# Patient Record
Sex: Male | Born: 1980
Health system: Southern US, Community
[De-identification: ages and names within clinical notes are randomized; demographics above are authoritative.]

## PROBLEM LIST (undated history)

## (undated) DIAGNOSIS — I10 Essential (primary) hypertension: Secondary | ICD-10-CM

## (undated) DIAGNOSIS — B019 Varicella without complication: Secondary | ICD-10-CM

## (undated) DIAGNOSIS — F411 Generalized anxiety disorder: Secondary | ICD-10-CM

## (undated) DIAGNOSIS — F909 Attention-deficit hyperactivity disorder, unspecified type: Secondary | ICD-10-CM

## (undated) DIAGNOSIS — A6000 Herpesviral infection of urogenital system, unspecified: Secondary | ICD-10-CM

## (undated) DIAGNOSIS — F17200 Nicotine dependence, unspecified, uncomplicated: Secondary | ICD-10-CM

## (undated) HISTORY — DX: Attention-deficit hyperactivity disorder, unspecified type: F90.9

## (undated) HISTORY — DX: Varicella without complication: B01.9

## (undated) HISTORY — DX: Nicotine dependence, unspecified, uncomplicated: F17.200

## (undated) HISTORY — DX: Essential (primary) hypertension: I10

## (undated) HISTORY — PX: MOUTH SURGERY: SHX715

## (undated) HISTORY — DX: Generalized anxiety disorder: F41.1

## (undated) HISTORY — DX: Herpesviral infection of urogenital system, unspecified: A60.00

---

## 2009-01-17 ENCOUNTER — Emergency Department: Payer: Self-pay | Admitting: Emergency Medicine

## 2014-11-11 ENCOUNTER — Ambulatory Visit: Payer: Self-pay | Admitting: Internal Medicine

## 2014-11-18 ENCOUNTER — Ambulatory Visit (INDEPENDENT_AMBULATORY_CARE_PROVIDER_SITE_OTHER): Payer: BLUE CROSS/BLUE SHIELD | Admitting: Internal Medicine

## 2014-11-18 ENCOUNTER — Encounter (INDEPENDENT_AMBULATORY_CARE_PROVIDER_SITE_OTHER): Payer: Self-pay

## 2014-11-18 ENCOUNTER — Encounter: Payer: Self-pay | Admitting: Internal Medicine

## 2014-11-18 VITALS — BP 128/74 | HR 72 | Temp 98.4°F | Ht 69.0 in | Wt 188.0 lb

## 2014-11-18 DIAGNOSIS — F17200 Nicotine dependence, unspecified, uncomplicated: Secondary | ICD-10-CM

## 2014-11-18 DIAGNOSIS — Z720 Tobacco use: Secondary | ICD-10-CM | POA: Diagnosis not present

## 2014-11-18 DIAGNOSIS — A6000 Herpesviral infection of urogenital system, unspecified: Secondary | ICD-10-CM | POA: Diagnosis not present

## 2014-11-18 HISTORY — DX: Nicotine dependence, unspecified, uncomplicated: F17.200

## 2014-11-18 MED ORDER — VARENICLINE TARTRATE 1 MG PO TABS: 1.0000 mg | ORAL_TABLET | Freq: Two times a day (BID) | ORAL | Status: DC

## 2014-11-18 MED ORDER — VARENICLINE TARTRATE 0.5 MG X 11 & 1 MG X 42 PO MISC: ORAL | Status: DC

## 2014-11-18 MED ORDER — VALACYCLOVIR HCL 500 MG PO TABS: ORAL_TABLET | ORAL | Status: DC

## 2014-11-18 NOTE — Progress Notes (Signed)
HPI  Pt presents to the clinic today to establish care and for management of the conditions listed below. He is transferring care from [facility].  Genital Herpes: His last outbreak was 1 year ago, and he reports this is the only time that it happened.  He does want to talk about smoking cessation. He has tried nicotine patches, gums lozenges and vapor. Nothing has worked. His wife is expecting and he really wants to quit.  Flu: never Tetanus: 2011 Dentist: biannually  Past Medical History  Diagnosis Date  . Chicken pox   . Genital herpes     Current Outpatient Prescriptions  Medication Sig Dispense Refill  . GINSENG KOREAN PO Take 1 capsule by mouth daily.    . naproxen sodium (ANAPROX) 220 MG tablet Take 220 mg by mouth as needed.     No current facility-administered medications for this visit.    Allergies  Allergen Reactions  . Bee Venom Swelling    Family History  Problem Relation Age of Onset  . Heart disease Maternal Grandmother     Social History   Social History  . Marital Status: Single    Spouse Name: N/A  . Number of Children: N/A  . Years of Education: N/A   Occupational History  . Not on file.   Social History Main Topics  . Smoking status: Current Some Day Smoker -- 1.50 packs/day    Types: Cigarettes  . Smokeless tobacco: Never Used  . Alcohol Use: 0.0 oz/week    0 Standard drinks or equivalent per week     Comment: occasional  . Drug Use: No  . Sexual Activity: Not on file   Other Topics Concern  . Not on file   Social History Narrative  . No narrative on file    ROS:  Constitutional: Denies fever, malaise, fatigue, headache or abrupt weight changes.  Respiratory: Denies difficulty breathing, shortness of breath, cough or sputum production.   Cardiovascular: Denies chest pain, chest tightness, palpitations or swelling in the hands or feet.  Gastrointestinal: Denies abdominal pain, bloating, constipation, diarrhea  or blood in the stool.  GU: Denies frequency, urgency, pain with urination, blood in urine, odor or discharge. Neurological: Denies dizziness, difficulty with memory, difficulty with speech or problems with balance and coordination.  Psych: Denies anxiety, depression, SI/HI.  No other specific complaints in a complete review of systems (except as listed in HPI above).  PE:  BP 128/74 mmHg  Pulse 72  Temp(Src) 98.4 F (36.9 C) (Oral)  Ht  (1.753 m)  Wt 188 lb (85.276 kg)  BMI 27.75 kg/m2  SpO2 98%  Wt Readings from Last 3 Encounters:  11/18/14 188 lb (85.276 kg)    General: Appears his stated age, well developed, well nourished in NAD. Cardiovascular: Normal rate and rhythm. S1,S2 noted.  No murmur, rubs or gallops noted.  Pulmonary/Chest: Normal effort and positive vesicular breath sounds. No respiratory distress. No wheezes, rales or ronchi noted.  Abdomen: Soft and nontender. Normal bowel sounds, no bruits noted. No distention or masses noted. Liver, spleen and kidneys non palpable. Neurological: Alert and oriented.  Psychiatric: Mood and affect normal. Behavior is normal. Judgment and thought content normal.    Assessment and Plan:

## 2014-11-18 NOTE — Patient Instructions (Signed)
Smoking Cessation Quitting smoking is important to your health and has many advantages. However, it is not always easy to quit since nicotine is a very addictive drug. Oftentimes, people try 3 times or more before being able to quit. This document explains the best ways for you to prepare to quit smoking. Quitting takes hard work and a lot of effort, but you can do it. ADVANTAGES OF QUITTING SMOKING  You will live longer, feel better, and live better.  Your body will feel the impact of quitting smoking almost immediately.  Within 20 minutes, blood pressure decreases. Your pulse returns to its normal level.  After 8 hours, carbon monoxide levels in the blood return to normal. Your oxygen level increases.  After 24 hours, the chance of having a heart attack starts to decrease. Your breath, hair, and body stop smelling like smoke.  After 48 hours, damaged nerve endings begin to recover. Your sense of taste and smell improve.  After 72 hours, the body is virtually free of nicotine. Your bronchial tubes relax and breathing becomes easier.  After 2 to 12 weeks, lungs can hold more air. Exercise becomes easier and circulation improves.  The risk of having a heart attack, stroke, cancer, or lung disease is greatly reduced.  After 1 year, the risk of coronary heart disease is cut in half.  After 5 years, the risk of stroke falls to the same as a nonsmoker.  After 10 years, the risk of lung cancer is cut in half and the risk of other cancers decreases significantly.  After 15 years, the risk of coronary heart disease drops, usually to the level of a nonsmoker.  If you are pregnant, quitting smoking will improve your chances of having a healthy baby.  The people you live with, especially any children, will be healthier.  You will have extra money to spend on things other than cigarettes. QUESTIONS TO THINK ABOUT BEFORE ATTEMPTING TO QUIT You may want to talk about your answers with your  health care provider.  Why do you want to quit?  If you tried to quit in the past, what helped and what did not?  What will be the most difficult situations for you after you quit? How will you plan to handle them?  Who can help you through the tough times? Your family? Friends? A health care provider?  What pleasures do you get from smoking? What ways can you still get pleasure if you quit? Here are some questions to ask your health care provider:  How can you help me to be successful at quitting?  What medicine do you think would be best for me and how should I take it?  What should I do if I need more help?  What is smoking withdrawal like? How can I get information on withdrawal? GET READY  Set a quit date.  Change your environment by getting rid of all cigarettes, ashtrays, matches, and lighters in your home, car, or work. Do not let people smoke in your home.  Review your past attempts to quit. Think about what worked and what did not. GET SUPPORT AND ENCOURAGEMENT You have a better chance of being successful if you have help. You can get support in many ways.  Tell your family, friends, and coworkers that you are going to quit and need their support. Ask them not to smoke around you.  Get individual, group, or telephone counseling and support. Programs are available at local hospitals and health centers. Call   your local health department for information about programs in your area.  Spiritual beliefs and practices may help some smokers quit.  Download a "quit meter" on your computer to keep track of quit statistics, such as how long you have gone without smoking, cigarettes not smoked, and money saved.  Get a self-help book about quitting smoking and staying off tobacco. LEARN NEW SKILLS AND BEHAVIORS  Distract yourself from urges to smoke. Talk to someone, go for a walk, or occupy your time with a task.  Change your normal routine. Take a different route to work.  Drink tea instead of coffee. Eat breakfast in a different place.  Reduce your stress. Take a hot bath, exercise, or read a book.  Plan something enjoyable to do every day. Reward yourself for not smoking.  Explore interactive web-based programs that specialize in helping you quit. GET MEDICINE AND USE IT CORRECTLY Medicines can help you stop smoking and decrease the urge to smoke. Combining medicine with the above behavioral methods and support can greatly increase your chances of successfully quitting smoking.  Nicotine replacement therapy helps deliver nicotine to your body without the negative effects and risks of smoking. Nicotine replacement therapy includes nicotine gum, lozenges, inhalers, nasal sprays, and skin patches. Some may be available over-the-counter and others require a prescription.  Antidepressant medicine helps people abstain from smoking, but how this works is unknown. This medicine is available by prescription.  Nicotinic receptor partial agonist medicine simulates the effect of nicotine in your brain. This medicine is available by prescription. Ask your health care provider for advice about which medicines to use and how to use them based on your health history. Your health care provider will tell you what side effects to look out for if you choose to be on a medicine or therapy. Carefully read the information on the package. Do not use any other product containing nicotine while using a nicotine replacement product.  RELAPSE OR DIFFICULT SITUATIONS Most relapses occur within the first 3 months after quitting. Do not be discouraged if you start smoking again. Remember, most people try several times before finally quitting. You may have symptoms of withdrawal because your body is used to nicotine. You may crave cigarettes, be irritable, feel very hungry, cough often, get headaches, or have difficulty concentrating. The withdrawal symptoms are only temporary. They are strongest  when you first quit, but they will go away within 10-14 days. To reduce the chances of relapse, try to:  Avoid drinking alcohol. Drinking lowers your chances of successfully quitting.  Reduce the amount of caffeine you consume. Once you quit smoking, the amount of caffeine in your body increases and can give you symptoms, such as a rapid heartbeat, sweating, and anxiety.  Avoid smokers because they can make you want to smoke.  Do not let weight gain distract you. Many smokers will gain weight when they quit, usually less than 10 pounds. Eat a healthy diet and stay active. You can always lose the weight gained after you quit.  Find ways to improve your mood other than smoking. FOR MORE INFORMATION  www.smokefree.gov  Document Released: 03/05/2001 Document Revised: 07/26/2013 Document Reviewed: 06/20/2011 [employer] Patient Information 2015 [employer]. This information is not intended to replace advice given to you by your health care provider. Make sure you discuss any questions you have with your health care provider.  

## 2014-11-18 NOTE — Assessment & Plan Note (Signed)
eRx for Chantix Pick a quit date and stick to it

## 2014-11-18 NOTE — Assessment & Plan Note (Signed)
eRx for Valtrex to take when you get a outbreak

## 2015-03-31 ENCOUNTER — Ambulatory Visit (INDEPENDENT_AMBULATORY_CARE_PROVIDER_SITE_OTHER): Payer: Managed Care, Other (non HMO) | Admitting: Primary Care

## 2015-03-31 ENCOUNTER — Ambulatory Visit (INDEPENDENT_AMBULATORY_CARE_PROVIDER_SITE_OTHER)
Admission: RE | Admit: 2015-03-31 | Discharge: 2015-03-31 | Disposition: A | Discharge status: Home / Self Care | Payer: Managed Care, Other (non HMO) | Source: Ambulatory Visit | Attending: Primary Care | Admitting: Primary Care

## 2015-03-31 ENCOUNTER — Encounter: Payer: Self-pay | Admitting: Primary Care

## 2015-03-31 VITALS — BP 156/80 | HR 79 | Temp 97.9°F | Ht 69.0 in | Wt 201.8 lb

## 2015-03-31 DIAGNOSIS — R1010 Upper abdominal pain, unspecified: Secondary | ICD-10-CM | POA: Diagnosis not present

## 2015-03-31 DIAGNOSIS — R109 Unspecified abdominal pain: Secondary | ICD-10-CM

## 2015-03-31 LAB — POC URINALSYSI DIPSTICK (AUTOMATED)
Bilirubin, UA: NEGATIVE
Blood, UA: NEGATIVE
GLUCOSE UA: NEGATIVE
Ketones, UA: NEGATIVE
Leukocytes, UA: NEGATIVE
NITRITE UA: NEGATIVE
Protein, UA: NEGATIVE
Spec Grav, UA: >=1.03
UROBILINOGEN UA: NEGATIVE
pH, UA: 6

## 2015-03-31 MED ORDER — OXYCODONE-ACETAMINOPHEN 5-325 MG PO TABS: 1.0000 | ORAL_TABLET | Freq: Three times a day (TID) | ORAL | Status: DC | PRN

## 2015-03-31 MED ORDER — TAMSULOSIN HCL 0.4 MG PO CAPS: 0.4000 mg | ORAL_CAPSULE | Freq: Every day | ORAL | Status: DC

## 2015-03-31 NOTE — Progress Notes (Signed)
Pre visit review using our clinic review tool, if applicable. No additional management support is needed unless otherwise documented below in the visit note. 

## 2015-03-31 NOTE — Progress Notes (Signed)
Subjective:    Patient ID: Peter Kramer, male    DOB: 1981-01-07, 35 y.o.   MRN: [id number]  HPI  Peter Kramer is a 35 year old male who presents today with a chief complaint of flank pain. His pain is located to the left flank and left lower abdomen. His pain began Friday last week and his pain gradually become worse. He describes his pain as sharp and has "put me on the ground several times" He also reports difficulty urinating and hematuria. Denies fevers, penile pain, penile discharge, vomiting. He's cut out sodas since Friday and has increased his consumption of water. Prior he was drinking [employer]. He has a strong FH of kidney stones. He's taken ibuprofen and tylenol without relief.   Review of Systems  Constitutional: Negative for fever and chills.  Gastrointestinal: Positive for nausea.       Left groin pain  Genitourinary: Positive for hematuria, flank pain and difficulty urinating. Negative for discharge.  Neurological: Negative for dizziness.       Past Medical History  Diagnosis Date  . Chicken pox   . Genital herpes     Social History   Social History  . Marital Status: Single    Spouse Name: N/A  . Number of Children: N/A  . Years of Education: N/A   Occupational History  . Not on file.   Social History Main Topics  . Smoking status: Current Some Day Smoker -- 1.50 packs/day for 14 years    Types: Cigarettes  . Smokeless tobacco: Never Used  . Alcohol Use: 0.0 oz/week    0 Standard drinks or equivalent per week     Comment: occasional  . Drug Use: No  . Sexual Activity: Yes   Other Topics Concern  . Not on file   Social History Narrative    No past surgical history on file.  Family History  Problem Relation Age of Onset  . Heart disease Maternal Grandmother   . Cancer Paternal Grandfather     brain  . Diabetes Neg Hx   . Stroke Neg Hx     Allergies  Allergen Reactions  . Bee Venom Swelling    Current Outpatient Prescriptions on  File Prior to Visit  Medication Sig Dispense Refill  . naproxen sodium (ANAPROX) 220 MG tablet Take 220 mg by mouth as needed.    . valACYclovir (VALTREX) 500 MG tablet Takes 1 tab twice daily for 3 days when you have an outbreak 30 tablet 0  . GINSENG KOREAN PO Take 1 capsule by mouth daily. Reported on 03/31/2015    . varenicline (CHANTIX CONTINUING MONTH PAK) 1 MG tablet Take 1 tablet (1 mg total) by mouth 2 (two) times daily. (Patient not taking: Reported on 03/31/2015) 60 tablet 0  . varenicline (CHANTIX STARTING MONTH PAK) 0.5 MG X 11 & 1 MG X 42 tablet Take one 0.5 mg tablet by mouth once daily for 3 days, then increase to one 0.5 mg tablet twice daily for 4 days, then increase to one 1 mg tablet twice daily. (Patient not taking: Reported on 03/31/2015) 53 tablet 0   No current facility-administered medications on file prior to visit.    BP 156/80 mmHg  Pulse 79  Temp(Src) 97.9 F (36.6 C) (Oral)  Ht 5\' 9"  (1.753 m)  Wt 201 lb 12.8 oz (91.536 kg)  BMI 29.79 kg/m2  SpO2 98%    Objective:   Physical Exam  Constitutional: He appears well-nourished.  Appears  uncomfortable in exam room, pacing, unable to sit.  Cardiovascular: Normal rate and regular rhythm.   Pulmonary/Chest: Effort normal and breath sounds normal.  Abdominal: There is tenderness in the left lower quadrant. There is CVA tenderness.  Moderate CVA tenderness to left flank.  Skin: Skin is warm and dry.          Assessment & Plan:  Flank Pain:  Located to left flank and left groin x 1 week, gradually increased. Hematuria and difficulty urinating, nausea. Denies fevers. Exam with moderate CVA tenderness to left flank, tender to left groin. Pacing in room, unable to sit longer than a few minutes. Appears very uncomfortable. UA: Negative for blood, leuks, nitrities. Will obtain KUB. Suspect kidney stones due to presentation and HPI, so will prescribe percocet for painand flomax to help facilitate removal of stone.  Continue water consumption, no sodas. Urine strainer provided. ER precautions provided. He verbalized understanding.

## 2015-03-31 NOTE — Patient Instructions (Signed)
Complete xray(s) prior to leaving today. I will notify you of your results once received.  You may take the percocet tablets as needed for severe pain. Take 1-2 tablets by mouth every 8 hours as needed. Ensure you have some food on your stomach when you take this medication.  You may take the Flomax once daily with a meal to help pass the presumed stone.  Use the strainer each time you urinate to collect the stone.  Continue consumption of water. Go to the ED if you experience fevers, increased pain.  It was a pleasure meeting you!  Kidney Stones Kidney stones (urolithiasis) are deposits that form inside your kidneys. The intense pain is caused by the stone moving through the urinary tract. When the stone moves, the ureter goes into spasm around the stone. The stone is usually passed in the urine.  CAUSES   A disorder that makes certain neck glands produce too much parathyroid hormone (primary hyperparathyroidism).  A buildup of uric acid crystals, similar to gout in your joints.  Narrowing (stricture) of the ureter.  A kidney obstruction present at birth (congenital obstruction).  Previous surgery on the kidney or ureters.  Numerous kidney infections. SYMPTOMS   Feeling sick to your stomach (nauseous).  Throwing up (vomiting).  Blood in the urine (hematuria).  Pain that usually spreads (radiates) to the groin.  Frequency or urgency of urination. DIAGNOSIS   Taking a history and physical exam.  Blood or urine tests.  CT scan.  Occasionally, an examination of the inside of the urinary bladder (cystoscopy) is performed. TREATMENT   Observation.  Increasing your fluid intake.  Extracorporeal shock wave lithotripsy--This is a noninvasive procedure that uses shock waves to break up kidney stones.  Surgery may be needed if you have severe pain or persistent obstruction. There are various surgical procedures. Most of the procedures are performed with the use of small  instruments. Only small incisions are needed to accommodate these instruments, so recovery time is minimized. The size, location, and chemical composition are all important variables that will determine the proper choice of action for you. Talk to your health care provider to better understand your situation so that you will minimize the risk of injury to yourself and your kidney.  HOME CARE INSTRUCTIONS   Drink enough water and fluids to keep your urine clear or pale yellow. This will help you to pass the stone or stone fragments.  Strain all urine through the provided strainer. Keep all particulate matter and stones for your health care provider to see. The stone causing the pain may be as small as a grain of salt. It is very important to use the strainer each and every time you pass your urine. The collection of your stone will allow your health care provider to analyze it and verify that a stone has actually passed. The stone analysis will often identify what you can do to reduce the incidence of recurrences.  Only take over-the-counter or prescription medicines for pain, discomfort, or fever as directed by your health care provider.  Keep all follow-up visits as told by your health care provider. This is important.  Get follow-up X-rays if required. The absence of pain does not always mean that the stone has passed. It may have only stopped moving. If the urine remains completely obstructed, it can cause loss of kidney function or even complete destruction of the kidney. It is your responsibility to make sure X-rays and follow-ups are completed. Ultrasounds of the  kidney can show blockages and the status of the kidney. Ultrasounds are not associated with any radiation and can be performed easily in a matter of minutes.  Make changes to your daily diet as told by your health care provider. You may be told to:  Limit the amount of salt that you eat.  Eat 5 or more servings of fruits and  vegetables each day.  Limit the amount of meat, poultry, fish, and eggs that you eat.  Collect a 24-hour urine sample as told by your health care provider.You may need to collect another urine sample every 6-12 months. SEEK MEDICAL CARE IF:  You experience pain that is progressive and unresponsive to any pain medicine you have been prescribed. SEEK IMMEDIATE MEDICAL CARE IF:   Pain cannot be controlled with the prescribed medicine.  You have a fever or shaking chills.  The severity or intensity of pain increases over 18 hours and is not relieved by pain medicine.  You develop a new onset of abdominal pain.  You feel faint or pass out.  You are unable to urinate.   This information is not intended to replace advice given to you by your health care provider. Make sure you discuss any questions you have with your health care provider.   Document Released: 03/11/2005 Document Revised: 11/30/2014 Document Reviewed: 08/12/2012 Elsevier Interactive Patient Education [employer]2016 [employer]

## 2015-06-12 ENCOUNTER — Ambulatory Visit (INDEPENDENT_AMBULATORY_CARE_PROVIDER_SITE_OTHER): Payer: Managed Care, Other (non HMO) | Admitting: Internal Medicine

## 2015-06-12 ENCOUNTER — Encounter: Payer: Self-pay | Admitting: Internal Medicine

## 2015-06-12 VITALS — BP 134/84 | HR 74 | Temp 97.8°F | Wt 197.0 lb

## 2015-06-12 DIAGNOSIS — Z716 Tobacco abuse counseling: Secondary | ICD-10-CM

## 2015-06-12 DIAGNOSIS — F4322 Adjustment disorder with anxiety: Secondary | ICD-10-CM

## 2015-06-12 DIAGNOSIS — G4700 Insomnia, unspecified: Secondary | ICD-10-CM

## 2015-06-12 DIAGNOSIS — Z87891 Personal history of nicotine dependence: Secondary | ICD-10-CM

## 2015-06-12 DIAGNOSIS — Z720 Tobacco use: Secondary | ICD-10-CM | POA: Diagnosis not present

## 2015-06-12 MED ORDER — BUPROPION HCL ER (SR) 150 MG PO TB12: ORAL_TABLET | ORAL | Status: DC

## 2015-06-12 NOTE — Patient Instructions (Signed)
Smoking Cessation, Tips for Success If you are ready to quit smoking, congratulations! You have chosen to help yourself be healthier. Cigarettes bring nicotine, tar, carbon monoxide, and other irritants into your body. Your lungs, heart, and blood vessels will be able to work better without these poisons. There are many different ways to quit smoking. Nicotine gum, nicotine patches, a nicotine inhaler, or nicotine nasal spray can help with physical craving. Hypnosis, support groups, and medicines help break the habit of smoking. WHAT THINGS CAN I DO TO MAKE QUITTING EASIER?  Here are some tips to help you quit for good:  Pick a date when you will quit smoking completely. Tell all of your friends and family about your plan to quit on that date.  Do not try to slowly cut down on the number of cigarettes you are smoking. Pick a quit date and quit smoking completely starting on that day.  Throw away all cigarettes.   Clean and remove all ashtrays from your home, work, and car.  On a card, write down your reasons for quitting. Carry the card with you and read it when you get the urge to smoke.  Cleanse your body of nicotine. Drink enough water and fluids to keep your urine clear or pale yellow. Do this after quitting to flush the nicotine from your body.  Learn to predict your moods. Do not let a bad situation be your excuse to have a cigarette. Some situations in your life might tempt you into wanting a cigarette.  Never have "just one" cigarette. It leads to wanting another and another. Remind yourself of your decision to quit.  Change habits associated with smoking. If you smoked while driving or when feeling stressed, try other activities to replace smoking. Stand up when drinking your coffee. Brush your teeth after eating. Sit in a different chair when you read the paper. Avoid alcohol while trying to quit, and try to drink fewer caffeinated beverages. Alcohol and caffeine may urge you to  smoke.  Avoid foods and drinks that can trigger a desire to smoke, such as sugary or spicy foods and alcohol.  Ask people who smoke not to smoke around you.  Have something planned to do right after eating or having a cup of coffee. For example, plan to take a walk or exercise.  Try a relaxation exercise to calm you down and decrease your stress. Remember, you may be tense and nervous for the first 2 weeks after you quit, but this will pass.  Find new activities to keep your hands busy. Play with a pen, coin, or rubber band. Doodle or draw things on paper.  Brush your teeth right after eating. This will help cut down on the craving for the taste of tobacco after meals. You can also try mouthwash.   Use oral substitutes in place of cigarettes. Try using lemon drops, carrots, cinnamon sticks, or chewing gum. Keep them handy so they are available when you have the urge to smoke.  When you have the urge to smoke, try deep breathing.  Designate your home as a nonsmoking area.  If you are a heavy smoker, ask your health care provider about a prescription for nicotine chewing gum. It can ease your withdrawal from nicotine.  Reward yourself. Set aside the cigarette money you save and buy yourself something nice.  Look for support from others. Join a support group or smoking cessation program. Ask someone at home or at work to help you with your plan   to quit smoking.  Always ask yourself, "Do I need this cigarette or is this just a reflex?" Tell yourself, "Today, I choose not to smoke," or "I do not want to smoke." You are reminding yourself of your decision to quit.  Do not replace cigarette smoking with electronic cigarettes (commonly called e-cigarettes). The safety of e-cigarettes is unknown, and some may contain harmful chemicals.  If you relapse, do not give up! Plan ahead and think about what you will do the next time you get the urge to smoke. HOW WILL I FEEL WHEN I QUIT SMOKING? You  may have symptoms of withdrawal because your body is used to nicotine (the addictive substance in cigarettes). You may crave cigarettes, be irritable, feel very hungry, cough often, get headaches, or have difficulty concentrating. The withdrawal symptoms are only temporary. They are strongest when you first quit but will go away within 10-14 days. When withdrawal symptoms occur, stay in control. Think about your reasons for quitting. Remind yourself that these are signs that your body is healing and getting used to being without cigarettes. Remember that withdrawal symptoms are easier to treat than the major diseases that smoking can cause.  Even after the withdrawal is over, expect periodic urges to smoke. However, these cravings are generally short lived and will go away whether you smoke or not. Do not smoke! WHAT RESOURCES ARE AVAILABLE TO HELP ME QUIT SMOKING? Your health care provider can direct you to community resources or hospitals for support, which may include:  Group support.  Education.  Hypnosis.  Therapy.   This information is not intended to replace advice given to you by your health care provider. Make sure you discuss any questions you have with your health care provider.   Document Released: 12/08/2003 Document Revised: 04/01/2014 Document Reviewed: 08/27/2012 Elsevier Interactive Patient Education 2016 [employer] Inc.  

## 2015-06-12 NOTE — Progress Notes (Addendum)
Subjective:    Patient ID: Peter Kramer, male    DOB: [birth year], 35 y.o.   MRN: [id number]  HPI  Pt presents to the clinic today to discuss smoking cessation. He has tried nicotine patches, gums, lozenges and vapor. He was giving a RX for Chantix 10/2014 but reports he never felt like it never really did anything. He is now smoking 1 ppd.  He also wants to discuss anxiety and insomnia. This started 2 weeks ago, just around the time his daughter was born. He reports he is more irritable. He is nervous about how to take care of his daughter. He feels like his smoking has increased since the birth or his daughter.  Review of Systems      Past Medical History  Diagnosis Date  . Chicken pox   . Genital herpes     Current Outpatient Prescriptions  Medication Sig Dispense Refill  . GINSENG KOREAN PO Take 1 capsule by mouth daily. Reported on 03/31/2015    . naproxen sodium (ANAPROX) 220 MG tablet Take 220 mg by mouth as needed.    Marland Kitchen. oxyCODONE-acetaminophen (PERCOCET/ROXICET) 5-325 MG tablet Take 1-2 tablets by mouth every 8 (eight) hours as needed for severe pain. 30 tablet 0  . tamsulosin (FLOMAX) 0.4 MG CAPS capsule Take 1 capsule (0.4 mg total) by mouth daily. 30 capsule 0  . valACYclovir (VALTREX) 500 MG tablet Takes 1 tab twice daily for 3 days when you have an outbreak 30 tablet 0  . varenicline (CHANTIX CONTINUING MONTH PAK) 1 MG tablet Take 1 tablet (1 mg total) by mouth 2 (two) times daily. 60 tablet 0  . varenicline (CHANTIX STARTING MONTH PAK) 0.5 MG X 11 & 1 MG X 42 tablet Take one 0.5 mg tablet by mouth once daily for 3 days, then increase to one 0.5 mg tablet twice daily for 4 days, then increase to one 1 mg tablet twice daily. 53 tablet 0   No current facility-administered medications for this visit.    Allergies  Allergen Reactions  . Bee Venom Swelling    Family History  Problem Relation Age of Onset  . Heart disease Maternal Grandmother   . Cancer Paternal  Grandfather     brain  . Diabetes Neg Hx   . Stroke Neg Hx     Social History   Social History  . Marital Status: Single    Spouse Name: N/A  . Number of Children: N/A  . Years of Education: N/A   Occupational History  . Not on file.   Social History Main Topics  . Smoking status: Current Some Day Smoker -- 1.50 packs/day for 14 years    Types: Cigarettes  . Smokeless tobacco: Never Used  . Alcohol Use: 0.0 oz/week    0 Standard drinks or equivalent per week     Comment: occasional  . Drug Use: No  . Sexual Activity: Yes   Other Topics Concern  . Not on file   Social History Narrative     Constitutional: Denies fever, malaise, fatigue, headache or abrupt weight changes.  HEENT: Denies eye pain, eye redness, ear pain, ringing in the ears, wax buildup, runny nose, nasal congestion, bloody nose, or sore throat. Respiratory: Denies difficulty breathing, shortness of breath, cough or sputum production.   Cardiovascular: Denies chest pain, chest tightness, palpitations or swelling in the hands or feet.  Neurological: Pt reports insomnia. Denies dizziness, difficulty with memory, difficulty with speech or problems with balance and coordination.  Psych: Pt reports anxiety. Denies depression, SI/HI.  No other specific complaints in a complete review of systems (except as listed in HPI above).  Objective:   Physical Exam   BP 134/84 mmHg  Pulse 74  Temp(Src) 97.8 F (36.6 C) (Oral)  Wt 197 lb (89.359 kg)  SpO2 98% Wt Readings from Last 3 Encounters:  06/12/15 197 lb (89.359 kg)  03/31/15 201 lb 12.8 oz (91.536 kg)  11/18/14 188 lb (85.276 kg)    General: Appears his stated age, well developed, well nourished in NAD. Cardiovascular: Normal rate and rhythm. S1,S2 noted.   Pulmonary/Chest: Normal effort and positive vesicular breath sounds. No respiratory distress. No wheezes, rales or ronchi noted.  Neurological: Alert and oriented.  Psychiatric: Mood and affect  normal. Behavior is normal. Judgment and thought content normal.       Assessment & Plan:   Encounter for smoking cessation:  Approx 15 minutes spent discussing smoking cessation Failed Chantix Given his anxiety, can try Wellbutrin eRx for Wellbutrin 150 mg BID x 12 weeks Handout given on smoking cessation tips for success  Adjustment disorder with anxiety:  R/T birth of a child Advised him everything he is experiencing is normal He needs to give himself time to adjust Wellbutrin may help with anxiety  RTC in 1 month to follow up smoking cessation/anxiety

## 2015-06-12 NOTE — Progress Notes (Signed)
Pre visit review using our clinic review tool, if applicable. No additional management support is needed unless otherwise documented below in the visit note. 

## 2015-06-21 NOTE — Progress Notes (Deleted)
I addended the note

## 2015-07-17 ENCOUNTER — Ambulatory Visit (INDEPENDENT_AMBULATORY_CARE_PROVIDER_SITE_OTHER): Payer: Managed Care, Other (non HMO) | Admitting: Internal Medicine

## 2015-07-17 ENCOUNTER — Encounter: Payer: Self-pay | Admitting: Internal Medicine

## 2015-07-17 VITALS — BP 126/88 | HR 84 | Temp 98.0°F | Wt 202.0 lb

## 2015-07-17 DIAGNOSIS — F411 Generalized anxiety disorder: Secondary | ICD-10-CM | POA: Insufficient documentation

## 2015-07-17 DIAGNOSIS — F17200 Nicotine dependence, unspecified, uncomplicated: Secondary | ICD-10-CM

## 2015-07-17 DIAGNOSIS — Z720 Tobacco use: Secondary | ICD-10-CM | POA: Diagnosis not present

## 2015-07-17 HISTORY — DX: Generalized anxiety disorder: F41.1

## 2015-07-17 MED ORDER — BUSPIRONE HCL 7.5 MG PO TABS: 7.5000 mg | ORAL_TABLET | Freq: Two times a day (BID) | ORAL | Status: DC

## 2015-07-17 NOTE — Assessment & Plan Note (Signed)
No improvement with Wellbutrin Will d/c Start Buspar 7.5 mg BID If anxiety improves, RTC in 6 months If anxiety persist, RTC in 4-6 weeks

## 2015-07-17 NOTE — Progress Notes (Signed)
Subjective:    Patient ID: Peter Kramer, male    DOB: [birth year], 35 y.o.   MRN: [id number]  HPI  Pt presents to the clinic today for 1 month follow up of anxiety and smoking cessation. He was started on Wellbutrin 1 month ago. He has not smoked in 3 weeks but does not feel like the Wellbutrin kept him from smoking, but he saw how much money he saved in 1 week from not buying cigarettes. He does feel like his anxiety is slightly worse since he stopped smoking. He continues to worry about multiple things, incluiding how to care for his newborn, how to financially provide for his family. He also reports his job is very stressful which makes his anxiety worse- he is looking for other employment. He denies depression, SI,HI.  Review of Systems      Past Medical History  Diagnosis Date  . Chicken pox   . Genital herpes     Current Outpatient Prescriptions  Medication Sig Dispense Refill  . GINSENG KOREAN PO Take 1 capsule by mouth daily. Reported on 03/31/2015    . naproxen sodium (ANAPROX) 220 MG tablet Take 220 mg by mouth as needed.    . valACYclovir (VALTREX) 500 MG tablet Takes 1 tab twice daily for 3 days when you have an outbreak 30 tablet 0  . busPIRone (BUSPAR) 7.5 MG tablet Take 1 tablet (7.5 mg total) by mouth 2 (two) times daily. 60 tablet 2   No current facility-administered medications for this visit.    Allergies  Allergen Reactions  . Bee Venom Swelling    Family History  Problem Relation Age of Onset  . Heart disease Maternal Grandmother   . Cancer Paternal Grandfather     brain  . Diabetes Neg Hx   . Stroke Neg Hx     Social History   Social History  . Marital Status: Single    Spouse Name: N/A  . Number of Children: N/A  . Years of Education: N/A   Occupational History  . Not on file.   Social History Main Topics  . Smoking status: Former Smoker -- 1.00 packs/day for 14 years    Types: Cigarettes  . Smokeless tobacco: Never Used  . Alcohol  Use: 0.0 oz/week    0 Standard drinks or equivalent per week     Comment: occasional  . Drug Use: No  . Sexual Activity: Yes   Other Topics Concern  . Not on file   Social History Narrative     Constitutional: Denies fever, malaise, fatigue, headache or abrupt weight changes.  Respiratory: Denies difficulty breathing, shortness of breath, cough or sputum production.   Cardiovascular: Denies chest pain, chest tightness, palpitations or swelling in the hands or feet.  Neurological: Denies dizziness, difficulty with memory, difficulty with speech or problems with balance and coordination.  Psych: Pt reports anxiety. Denies depression, SI/HI.  No other specific complaints in a complete review of systems (except as listed in HPI above).   Objective:   Physical Exam  BP 126/88 mmHg  Pulse 84  Temp(Src) 98 F (36.7 C) (Oral)  Wt 202 lb (91.627 kg) Wt Readings from Last 3 Encounters:  07/17/15 202 lb (91.627 kg)  06/12/15 197 lb (89.359 kg)  03/31/15 201 lb 12.8 oz (91.536 kg)    General: Appears his stated age, in NAD. Cardiovascular: Normal rate and rhythm. S1,S2 noted.  No murmur, rubs or gallops noted.  Pulmonary/Chest: Normal effort and positive vesicular breath  sounds. No respiratory distress. No wheezes, rales or ronchi noted.  Neurological: Alert and oriented.  Psychiatric: He is still very anxious today. His thought content and behavior is normal.       Assessment & Plan:

## 2015-07-17 NOTE — Assessment & Plan Note (Signed)
He has successfully quit smoking Discussed having a plan in place when he gets the urge for a cigarette to prevent him from starting back again

## 2015-07-17 NOTE — Patient Instructions (Signed)
Generalized Anxiety Disorder Generalized anxiety disorder (GAD) is a mental disorder. It interferes with life functions, including relationships, work, and school. GAD is different from normal anxiety, which everyone experiences at some point in their lives in response to specific life events and activities. Normal anxiety actually helps us prepare for and get through these life events and activities. Normal anxiety goes away after the event or activity is over.  GAD causes anxiety that is not necessarily related to specific events or activities. It also causes excess anxiety in proportion to specific events or activities. The anxiety associated with GAD is also difficult to control. GAD can vary from mild to severe. People with severe GAD can have intense waves of anxiety with physical symptoms (panic attacks).  SYMPTOMS The anxiety and worry associated with GAD are difficult to control. This anxiety and worry are related to many life events and activities and also occur more days than not for 6 months or longer. People with GAD also have three or more of the following symptoms (one or more in children):  Restlessness.   Fatigue.  Difficulty concentrating.   Irritability.  Muscle tension.  Difficulty sleeping or unsatisfying sleep. DIAGNOSIS GAD is diagnosed through an assessment by your health care provider. Your health care provider will ask you questions aboutyour mood,physical symptoms, and events in your life. Your health care provider may ask you about your medical history and use of alcohol or drugs, including prescription medicines. Your health care provider may also do a physical exam and blood tests. Certain medical conditions and the use of certain substances can cause symptoms similar to those associated with GAD. Your health care provider may refer you to a mental health specialist for further evaluation. TREATMENT The following therapies are usually used to treat GAD:    Medication. Antidepressant medication usually is prescribed for long-term daily control. Antianxiety medicines may be added in severe cases, especially when panic attacks occur.   Talk therapy (psychotherapy). Certain types of talk therapy can be helpful in treating GAD by providing support, education, and guidance. A form of talk therapy called cognitive behavioral therapy can teach you healthy ways to think about and react to daily life events and activities.  Stress managementtechniques. These include yoga, meditation, and exercise and can be very helpful when they are practiced regularly. A mental health specialist can help determine which treatment is best for you. Some people see improvement with one therapy. However, other people require a combination of therapies.   This information is not intended to replace advice given to you by your health care provider. Make sure you discuss any questions you have with your health care provider.   Document Released: 07/06/2012 Document Revised: 04/01/2014 Document Reviewed: 07/06/2012 Elsevier Interactive Patient Education 2016 [employer] Inc.  

## 2015-07-17 NOTE — Progress Notes (Signed)
Pre visit review using our clinic review tool, if applicable. No additional management support is needed unless otherwise documented below in the visit note. 

## 2015-08-28 ENCOUNTER — Ambulatory Visit: Payer: Managed Care, Other (non HMO) | Admitting: Internal Medicine

## 2015-08-28 ENCOUNTER — Telehealth: Payer: Self-pay | Admitting: Internal Medicine

## 2015-08-28 DIAGNOSIS — Z0289 Encounter for other administrative examinations: Secondary | ICD-10-CM

## 2015-08-28 NOTE — Telephone Encounter (Signed)
No follow up needed

## 2015-08-28 NOTE — Telephone Encounter (Signed)
Patient did not come for their scheduled appointment today 6 week follow up  Please let me know if the patient needs to be contacted immediately for follow up or if no follow up is necessary.   ° °

## 2015-12-14 ENCOUNTER — Encounter: Payer: Managed Care, Other (non HMO) | Admitting: Internal Medicine

## 2016-08-08 ENCOUNTER — Encounter: Payer: Self-pay | Admitting: Internal Medicine

## 2016-08-08 ENCOUNTER — Ambulatory Visit (INDEPENDENT_AMBULATORY_CARE_PROVIDER_SITE_OTHER): Payer: BLUE CROSS/BLUE SHIELD | Admitting: Internal Medicine

## 2016-08-08 VITALS — BP 134/76 | HR 73 | Temp 98.0°F | Wt 191.0 lb

## 2016-08-08 DIAGNOSIS — F411 Generalized anxiety disorder: Secondary | ICD-10-CM

## 2016-08-08 DIAGNOSIS — I10 Essential (primary) hypertension: Secondary | ICD-10-CM | POA: Insufficient documentation

## 2016-08-08 DIAGNOSIS — F419 Anxiety disorder, unspecified: Secondary | ICD-10-CM | POA: Diagnosis not present

## 2016-08-08 DIAGNOSIS — R41840 Attention and concentration deficit: Secondary | ICD-10-CM | POA: Diagnosis not present

## 2016-08-08 DIAGNOSIS — F17200 Nicotine dependence, unspecified, uncomplicated: Secondary | ICD-10-CM

## 2016-08-08 HISTORY — DX: Essential (primary) hypertension: I10

## 2016-08-08 MED ORDER — LISINOPRIL 10 MG PO TABS: 10.0000 mg | ORAL_TABLET | Freq: Every day | ORAL | 0 refills | Status: DC

## 2016-08-08 MED ORDER — LISINOPRIL 20 MG PO TABS: 20.0000 mg | ORAL_TABLET | Freq: Every day | ORAL | 2 refills | Status: DC

## 2016-08-08 NOTE — Assessment & Plan Note (Signed)
Deteriorated ? Untreated ADD, referral placed to psychology for formal evaluation If ADD, will discuss treatment If not ADD, need to find a way to manage his stress and anxiety Will discuss further once test results are available Support offered today

## 2016-08-08 NOTE — Assessment & Plan Note (Signed)
We will hold off on treatment until we can get his anxiety under control Advised him to start weaning down the number of cigarettes he is smoking

## 2016-08-08 NOTE — Patient Instructions (Signed)
Hypertension °Hypertension is another name for high blood pressure. High blood pressure forces your heart to work harder to pump blood. This can cause problems over time. °There are two numbers in a blood pressure reading. There is a top number (systolic) over a bottom number (diastolic). It is best to have a blood pressure below 120/80. Healthy choices can help lower your blood pressure. You may need medicine to help lower your blood pressure if: °· Your blood pressure cannot be lowered with healthy choices. °· Your blood pressure is higher than 130/80. °Follow these instructions at home: °Eating and drinking  °· If directed, follow the DASH eating plan. This diet includes: °¨ Filling half of your plate at each meal with fruits and vegetables. °¨ Filling one quarter of your plate at each meal with whole grains. Whole grains include whole wheat pasta, brown rice, and whole grain bread. °¨ Eating or drinking low-fat dairy products, such as skim milk or low-fat yogurt. °¨ Filling one quarter of your plate at each meal with low-fat (lean) proteins. Low-fat proteins include fish, skinless chicken, eggs, beans, and tofu. °¨ Avoiding fatty meat, cured and processed meat, or chicken with skin. °¨ Avoiding premade or processed food. °· Eat less than 1,500 mg of salt (sodium) a day. °· Limit alcohol use to no more than 1 drink a day for nonpregnant women and 2 drinks a day for men. One drink equals 12 oz of beer, 5 oz of wine, or 1½ oz of hard liquor. °Lifestyle  °· Work with your doctor to stay at a healthy weight or to lose weight. Ask your doctor what the best weight is for you. °· Get at least 30 minutes of exercise that causes your heart to beat faster (aerobic exercise) most days of the week. This may include walking, swimming, or biking. °· Get at least 30 minutes of exercise that strengthens your muscles (resistance exercise) at least 3 days a week. This may include lifting weights or pilates. °· Do not use any  products that contain nicotine or tobacco. This includes cigarettes and e-cigarettes. If you need help quitting, ask your doctor. °· Check your blood pressure at home as told by your doctor. °· Keep all follow-up visits as told by your doctor. This is important. °Medicines  °· Take over-the-counter and prescription medicines only as told by your doctor. Follow directions carefully. °· Do not skip doses of blood pressure medicine. The medicine does not work as well if you skip doses. Skipping doses also puts you at risk for problems. °· Ask your doctor about side effects or reactions to medicines that you should watch for. °Contact a doctor if: °· You think you are having a reaction to the medicine you are taking. °· You have headaches that keep coming back (recurring). °· You feel dizzy. °· You have swelling in your ankles. °· You have trouble with your vision. °Get help right away if: °· You get a very bad headache. °· You start to feel confused. °· You feel weak or numb. °· You feel faint. °· You get very bad pain in your: °¨ Chest. °¨ Belly (abdomen). °· You throw up (vomit) more than once. °· You have trouble breathing. °Summary °· Hypertension is another name for high blood pressure. °· Making healthy choices can help lower blood pressure. If your blood pressure cannot be controlled with healthy choices, you may need to take medicine. °This information is not intended to replace advice given to you by your   health care provider. Make sure you discuss any questions you have with your health care provider. °Document Released: 08/28/2007 Document Revised: 02/07/2016 Document Reviewed: 02/07/2016 °Elsevier Interactive Patient Education © 2017 [employer] ° °

## 2016-08-08 NOTE — Progress Notes (Signed)
Subjective:    Patient ID: Peter Kramer, male    DOB: [birth year], 36 y.o.   MRN: [id number]  HPI  Pt presents to the clinic today to follow up anxiety. This is an ongoing issue for him. This is being triggered by change in employment, financial stress. He reports his anxiety manifests itself in anger and outbursts. He was on Buspar 7.5 mg BID but he reports it was not effective. He has failed Wellbutrin in the past as well.  He also wants to try to stop smoking again. He successfully quit 1 year ago with the use of Wellbutrin. He abstained for about 6 weeks, but then restarted. He has been using Nicorette without much success. He has failed Chantix in the past. He is currently smoking about 5 cigarettes per day.  Of note, his BP today was 154/100. His previous blood pressure were 126/88, 134/88, 156/80. He has never been told that he has HTN, but it does run in his family. He does have head pressure, but denies visual changes, dizziness, chest pain or shortness of breath.   He reports he was on Vyvanse in the past. This was 6-7 years ago, diagnosed with his previous PCP. He never saw a psychologist. He feels like his difficulty concentrating on things may be contributing to his anxiety and anger.  Review of Systems      Past Medical History:  Diagnosis Date  . Chicken pox   . Genital herpes     Current Outpatient Prescriptions  Medication Sig Dispense Refill  . busPIRone (BUSPAR) 7.5 MG tablet Take 1 tablet (7.5 mg total) by mouth 2 (two) times daily. 60 tablet 2  . GINSENG KOREAN PO Take 1 capsule by mouth daily. Reported on 03/31/2015    . naproxen sodium (ANAPROX) 220 MG tablet Take 220 mg by mouth as needed.    . valACYclovir (VALTREX) 500 MG tablet Takes 1 tab twice daily for 3 days when you have an outbreak 30 tablet 0   No current facility-administered medications for this visit.     Allergies  Allergen Reactions  . Bee Venom Swelling    Family History  Problem  Relation Age of Onset  . Heart disease Maternal Grandmother   . Cancer Paternal Grandfather        brain  . Diabetes Neg Hx   . Stroke Neg Hx     Social History   Social History  . Marital status: Single    Spouse name: N/A  . Number of children: N/A  . Years of education: N/A   Occupational History  . Not on file.   Social History Main Topics  . Smoking status: Former Smoker    Packs/day: 1.00    Years: 14.00    Types: Cigarettes  . Smokeless tobacco: Never Used  . Alcohol use 0.0 oz/week     Comment: occasional  . Drug use: No  . Sexual activity: Yes   Other Topics Concern  . Not on file   Social History Narrative  . No narrative on file     Constitutional: Denies fever, malaise, fatigue, headache or abrupt weight changes.  Respiratory: Denies difficulty breathing, shortness of breath, cough or sputum production.   Cardiovascular: Denies chest pain, chest tightness, palpitations or swelling in the hands or feet.  Neurological: Pt reports difficulty concentrating. Denies dizziness, difficulty with memory, difficulty with speech or problems with balance and coordination.  Psych: Pt reports anxiety and anger. Denies depression, SI/HI.  No  other specific complaints in a complete review of systems (except as listed in HPI above).  Objective:   Physical Exam BP 134/76   Pulse 73   Temp 98 F (36.7 C) (Oral)   Wt 191 lb (86.6 kg)   SpO2 99%   BMI 28.21 kg/m  Wt Readings from Last 3 Encounters:  08/08/16 191 lb (86.6 kg)  07/17/15 202 lb (91.6 kg)  06/12/15 197 lb (89.4 kg)    General: Appears his stated age, in NAD. Cardiovascular: Normal rate and rhythm. Pulmonary/Chest: Normal effort and positive vesicular breath sounds. No respiratory distress. No wheezes, rales or ronchi noted.  Neurological: Alert and oriented.   Psychiatric: Mood and affect normal. Behavior is normal. Judgment and thought content normal.     Assessment & Plan:   Difficulty  Concentrating:  Will place referral to Dr. Reggy EyeAltabet for formal testing I am hesitant to put him on a stimulant due to his ADHD testing  RTC in 3 weeks for follow up HTN Linnette Panella, NP

## 2016-09-02 ENCOUNTER — Ambulatory Visit: Payer: BLUE CROSS/BLUE SHIELD | Admitting: Internal Medicine

## 2016-09-04 ENCOUNTER — Encounter: Payer: Self-pay | Admitting: Internal Medicine

## 2016-09-04 ENCOUNTER — Ambulatory Visit (INDEPENDENT_AMBULATORY_CARE_PROVIDER_SITE_OTHER): Payer: BLUE CROSS/BLUE SHIELD | Admitting: Internal Medicine

## 2016-09-04 VITALS — BP 132/82 | HR 88 | Temp 98.8°F | Wt 183.5 lb

## 2016-09-04 DIAGNOSIS — R509 Fever, unspecified: Secondary | ICD-10-CM | POA: Diagnosis not present

## 2016-09-04 DIAGNOSIS — L720 Epidermal cyst: Secondary | ICD-10-CM | POA: Diagnosis not present

## 2016-09-04 DIAGNOSIS — I10 Essential (primary) hypertension: Secondary | ICD-10-CM

## 2016-09-04 NOTE — Progress Notes (Signed)
Subjective:    Patient ID: Peter Kramer, male    DOB: June 16, 1980, 36 y.o.   MRN: [id number]  HPI  Pt presents to the clinic today for 3 week follow up of HTN. He was started on Lisinopril 10 mg daily at his last visit for a BP of 134/76 (he got 150/100 at home), but he reports he never picked up the prescription from the pharmacy. He reports he has reduced his smoking, cut out sodas and started exercise. His BP today is 132/82. There is no ECG on file.   Of note, he reports he has been running intermittent fevers over the last week. They have been as high as 102.8. He denies runny nose, ear pain, sore throat, cough, nausea, vomiting, diarrhea, or urinary complaints.   Review of Systems      Past Medical History:  Diagnosis Date  . Chicken pox   . Genital herpes     Current Outpatient Prescriptions  Medication Sig Dispense Refill  . GINSENG KOREAN PO Take 1 capsule by mouth daily. Reported on 03/31/2015    . lisinopril (PRINIVIL,ZESTRIL) 10 MG tablet Take 1 tablet (10 mg total) by mouth daily. 30 tablet 0  . naproxen sodium (ANAPROX) 220 MG tablet Take 220 mg by mouth as needed.    . valACYclovir (VALTREX) 500 MG tablet Takes 1 tab twice daily for 3 days when you have an outbreak 30 tablet 0   No current facility-administered medications for this visit.     Allergies  Allergen Reactions  . Bee Venom Swelling    Family History  Problem Relation Age of Onset  . Heart disease Maternal Grandmother   . Cancer Paternal Grandfather        brain  . Diabetes Neg Hx   . Stroke Neg Hx     Social History   Social History  . Marital status: Single    Spouse name: N/A  . Number of children: N/A  . Years of education: N/A   Occupational History  . Not on file.   Social History Main Topics  . Smoking status: Former Smoker    Packs/day: 1.00    Years: 14.00    Types: Cigarettes  . Smokeless tobacco: Never Used  . Alcohol use 0.0 oz/week     Comment: occasional  . Drug  use: No  . Sexual activity: Yes   Other Topics Concern  . Not on file   Social History Narrative  . No narrative on file     Constitutional: Pt reports fever. Denies malaise, fatigue, headache or abrupt weight changes.  HEENT: Denies eye pain, eye redness, ear pain, ringing in the ears, wax buildup, runny nose, nasal congestion, bloody nose, or sore throat. Respiratory: Denies difficulty breathing, shortness of breath, cough or sputum production.   Cardiovascular: Denies chest pain, chest tightness, palpitations or swelling in the hands or feet.  Gastrointestinal: Denies abdominal pain, bloating, constipation, diarrhea or blood in the stool.  GU: Denies urgency, frequency, pain with urination, burning sensation, blood in urine, odor or discharge.  No other specific complaints in a complete review of systems (except as listed in HPI above).  Objective:   Physical Exam  BP 132/82   Pulse 88   Temp 98.8 F (37.1 C) (Oral)   Wt 183 lb 8 oz (83.2 kg)   SpO2 98%   BMI 27.10 kg/m  Wt Readings from Last 3 Encounters:  09/04/16 183 lb 8 oz (83.2 kg)  08/08/16 191 lb (  86.6 kg)  07/17/15 202 lb (91.6 kg)    General: Appears his stated age, in NAD. Skin: Warm, dry and intact. No rashes noted. He has an tender cyst noted on left side of neck, approx 0.5 cm. HEENT: Ears: Tm's gray and intact, normal light reflex; Throat/Mouth: Teeth present, mucosa pink and moist, no exudate, lesions or ulcerations noted.  Neck:  No adenopathy noted. Cardiovascular: Normal rate and rhythm. S1,S2 noted.  No murmur, rubs or gallops noted.  Pulmonary/Chest: Normal effort and positive vesicular breath sounds. No respiratory distress. No wheezes, rales or ronchi noted.  Abdomen: Soft and nontender. Normal bowel sounds. No distention or masses noted.       Assessment & Plan:   Cyst of Neck:  Warm compresses TID  Fever:  No obvious cause If persist, let me know We can get labs and chest  xray  RTC in 3 weeks for HTN followup Niurka Benecke, NP

## 2016-09-04 NOTE — Assessment & Plan Note (Signed)
Elevated, not taking medications He will pick up Lisinopril and start taking as prescribed Encouraged smoking cessation

## 2016-09-04 NOTE — Patient Instructions (Signed)

## 2016-09-10 ENCOUNTER — Ambulatory Visit: Payer: BLUE CROSS/BLUE SHIELD | Admitting: Psychology

## 2016-09-12 ENCOUNTER — Ambulatory Visit: Payer: BLUE CROSS/BLUE SHIELD | Admitting: Psychology

## 2016-09-17 ENCOUNTER — Ambulatory Visit (INDEPENDENT_AMBULATORY_CARE_PROVIDER_SITE_OTHER): Payer: PRIVATE HEALTH INSURANCE | Admitting: Psychology

## 2016-09-17 DIAGNOSIS — F411 Generalized anxiety disorder: Secondary | ICD-10-CM | POA: Diagnosis not present

## 2016-09-19 ENCOUNTER — Telehealth: Payer: Self-pay

## 2016-09-19 NOTE — Telephone Encounter (Signed)
I sent him for evaluation of ADD. Was he diagnosed with ADD?

## 2016-09-19 NOTE — Telephone Encounter (Signed)
Pt saw Ms Peter Kramer 09/17/16; Ms Peter Kramer advised pt needed medication but she was a therapist and could not write rx. Pt request Pamala Hurry Baity NP contact Ms Peter Kramer (phone # [phone]) to see if Pamala Hurry Baity NP could prescribe med for pt. Pt would prefer not to take another day off work and spend more money to see another doctor. Pt request cb.

## 2016-09-19 NOTE — Telephone Encounter (Signed)
Pt states there was no clear other Dx, as he had been Dx with ADD in the past anyway... The notes have not been finished.... I can call [facility] tomorrow to see if they can fax copy of notes once completed

## 2016-09-20 NOTE — Telephone Encounter (Signed)
Noted, will address after I review [facility] notes

## 2016-09-30 NOTE — Telephone Encounter (Signed)
[facility]

## 2016-09-30 NOTE — Telephone Encounter (Signed)
[facility]. Can we get a copy of this?

## 2016-09-30 NOTE — Telephone Encounter (Signed)
Patient called to get an update about his rx. Patient can be reached at [phone].

## 2016-10-01 MED ORDER — FLUOXETINE HCL 10 MG PO CAPS: 10.0000 mg | ORAL_CAPSULE | Freq: Every day | ORAL | 1 refills | Status: DC

## 2016-10-01 NOTE — Addendum Note (Signed)
Addended by: Roena MaladyEVONTENNO, MELANIE Y on: 10/01/2016 02:55 PM   Modules accepted: Orders

## 2016-10-01 NOTE — Telephone Encounter (Signed)
Pt is agreeable to starting Prozac 10mg --- Rx sent through e-scribe Pt expressed understanding to call with update in 6 weeks

## 2016-10-01 NOTE — Telephone Encounter (Signed)
Call pt:  [facility] notes received, not helpful at all. Would he be willing to try Fluoxetine 10 mg daily? If so, send in #30,. 2 refills. He can update me or follow up in 6 weeks.

## 2016-10-05 ENCOUNTER — Emergency Department
Admission: EM | Admit: 2016-10-05 | Discharge: 2016-10-05 | Disposition: A | Discharge status: Home / Self Care | Payer: BLUE CROSS/BLUE SHIELD | Attending: Student in an Organized Health Care Education/Training Program | Admitting: Student in an Organized Health Care Education/Training Program

## 2016-10-05 ENCOUNTER — Encounter: Payer: Self-pay | Admitting: Emergency Medicine

## 2016-10-05 ENCOUNTER — Emergency Department: Payer: BLUE CROSS/BLUE SHIELD

## 2016-10-05 DIAGNOSIS — F17210 Nicotine dependence, cigarettes, uncomplicated: Secondary | ICD-10-CM | POA: Insufficient documentation

## 2016-10-05 DIAGNOSIS — K659 Peritonitis, unspecified: Secondary | ICD-10-CM | POA: Diagnosis not present

## 2016-10-05 DIAGNOSIS — R1032 Left lower quadrant pain: Secondary | ICD-10-CM | POA: Diagnosis present

## 2016-10-05 DIAGNOSIS — K529 Noninfective gastroenteritis and colitis, unspecified: Secondary | ICD-10-CM

## 2016-10-05 DIAGNOSIS — K6389 Other specified diseases of intestine: Secondary | ICD-10-CM

## 2016-10-05 LAB — COMPREHENSIVE METABOLIC PANEL
ALBUMIN: 4.4 g/dL (ref 3.5–5.0)
ALT: 50 U/L (ref 17–63)
AST: 29 U/L (ref 15–41)
Alkaline Phosphatase: 124 U/L (ref 38–126)
Anion gap: 9 (ref 5–15)
BUN: 15 mg/dL (ref 6–20)
CHLORIDE: 105 mmol/L (ref 101–111)
CO2: 24 mmol/L (ref 22–32)
CREATININE: 0.96 mg/dL (ref 0.61–1.24)
Calcium: 9.8 mg/dL (ref 8.9–10.3)
GFR calc Af Amer: >60 mL/min (ref >60)
GFR calc non Af Amer: >60 mL/min (ref >60)
Glucose, Bld: 108 mg/dL — ABNORMAL HIGH (ref 65–99)
Potassium: 4.3 mmol/L (ref 3.5–5.1)
SODIUM: 138 mmol/L (ref 135–145)
Total Bilirubin: 0.6 mg/dL (ref 0.3–1.2)
Total Protein: 7.7 g/dL (ref 6.5–8.1)

## 2016-10-05 LAB — CBC
HEMATOCRIT: 46.4 % (ref 40.0–52.0)
Hemoglobin: 15.8 g/dL (ref 13.0–18.0)
MCH: 28.4 pg (ref 26.0–34.0)
MCHC: 34.1 g/dL (ref 32.0–36.0)
MCV: 83.1 fL (ref 80.0–100.0)
PLATELETS: 259 10*3/uL (ref 150–440)
RBC: 5.59 MIL/uL (ref 4.40–5.90)
RDW: 14.1 % (ref 11.5–14.5)
WBC: 12.6 10*3/uL — ABNORMAL HIGH (ref 3.8–10.6)

## 2016-10-05 LAB — URINALYSIS, COMPLETE (UACMP) WITH MICROSCOPIC
Bacteria, UA: NONE SEEN
Bilirubin Urine: NEGATIVE
Glucose, UA: NEGATIVE mg/dL
Hgb urine dipstick: NEGATIVE
KETONES UR: NEGATIVE mg/dL
Leukocytes, UA: NEGATIVE
Nitrite: NEGATIVE
PH: 7 (ref 5.0–8.0)
Protein, ur: NEGATIVE mg/dL
SPECIFIC GRAVITY, URINE: 1.018 (ref 1.005–1.030)
SQUAMOUS EPITHELIAL / LPF: NONE SEEN

## 2016-10-05 LAB — LIPASE, BLOOD: LIPASE: 57 U/L — AB (ref 11–51)

## 2016-10-05 MED ORDER — HYDROCODONE-ACETAMINOPHEN 5-325 MG PO TABS
1.0000 | ORAL_TABLET | Freq: Once | ORAL | Status: AC
  Administered 2016-10-05: 1 via ORAL
  Filled 2016-10-05: qty 1

## 2016-10-05 MED ORDER — KETOROLAC TROMETHAMINE 30 MG/ML IJ SOLN
30.0000 mg | Freq: Once | INTRAMUSCULAR | Status: AC
  Administered 2016-10-05: 30 mg via INTRAMUSCULAR
  Filled 2016-10-05: qty 1

## 2016-10-05 MED ORDER — PROMETHAZINE HCL 12.5 MG PO TABS: 12.5000 mg | ORAL_TABLET | Freq: Four times a day (QID) | ORAL | 0 refills | Status: DC | PRN

## 2016-10-05 MED ORDER — HYDROCODONE-ACETAMINOPHEN 5-325 MG PO TABS: 1.0000 | ORAL_TABLET | ORAL | 0 refills | Status: DC | PRN

## 2016-10-05 MED ORDER — NAPROXEN 500 MG PO TABS: 500.0000 mg | ORAL_TABLET | Freq: Two times a day (BID) | ORAL | 0 refills | Status: AC

## 2016-10-05 MED ORDER — AMOXICILLIN-POT CLAVULANATE 875-125 MG PO TABS
1.0000 | ORAL_TABLET | Freq: Once | ORAL | Status: AC
  Administered 2016-10-05: 1 via ORAL
  Filled 2016-10-05: qty 1

## 2016-10-05 MED ORDER — AMOXICILLIN-POT CLAVULANATE 875-125 MG PO TABS: 1.0000 | ORAL_TABLET | Freq: Two times a day (BID) | ORAL | 0 refills | Status: AC

## 2016-10-05 NOTE — Discharge Instructions (Signed)

## 2016-10-05 NOTE — ED Provider Notes (Signed)
[facility] Emergency Department Provider Note    First MD Initiated Contact with Patient 10/05/16 1620     (approximate)  I have reviewed the triage vital signs and the nursing notes.   HISTORY  Chief Complaint Abdominal Pain    HPI Peter Kramer is a 36 y.o. male 3 days of left lower quadrant abdominal pain radiating to his left groin. Denies any dysuria. No hematuria. Has had some mild nausea associated with diarrhea but no vomiting. No measured fevers at home. Had pain like this in the past. Pain is nonradiating through to the back. States that it's a burning and aching pain. Has tried tylenol without relief.   Past Medical History:  Diagnosis Date  . Chicken pox   . Genital herpes    Family History  Problem Relation Age of Onset  . Heart disease Maternal Grandmother   . Cancer Paternal Grandfather        brain  . Diabetes Neg Hx   . Stroke Neg Hx    History reviewed. No pertinent surgical history. Patient Active Problem List   Diagnosis Date Noted  . Essential hypertension 08/08/2016  . Generalized anxiety disorder 07/17/2015  . Genital herpes 11/18/2014  . Smoker 11/18/2014      Prior to Admission medications   Medication Sig Start Date End Date Taking? Authorizing Provider  amoxicillin-clavulanate (AUGMENTIN) 875-125 MG tablet Take 1 tablet by mouth 2 (two) times daily. 10/05/16 10/12/16  Willy Eddyobinson, Shaneal Barasch, MD  FLUoxetine (PROZAC) 10 MG capsule Take 1 capsule (10 mg total) by mouth daily. 10/01/16   Lorre MunroeBaity, Regina W, NP  GINSENG KOREAN PO Take 1 capsule by mouth daily. Reported on 03/31/2015    [provider]  HYDROcodone-acetaminophen (NORCO) 5-325 MG tablet Take 1 tablet by mouth every 4 (four) hours as needed for moderate pain. 10/05/16   Willy Eddyobinson, Arkel Cartwright, MD  lisinopril (PRINIVIL,ZESTRIL) 10 MG tablet Take 1 tablet (10 mg total) by mouth daily. Patient not taking: Reported on 09/04/2016 08/08/16   Lorre MunroeBaity, Regina W, NP    naproxen (NAPROSYN) 500 MG tablet Take 1 tablet (500 mg total) by mouth 2 (two) times daily with a meal. 10/05/16 10/15/16  Willy Eddyobinson, Lelania Bia, MD  naproxen sodium (ANAPROX) 220 MG tablet Take 220 mg by mouth as needed.    [provider]  promethazine (PHENERGAN) 12.5 MG tablet Take 1 tablet (12.5 mg total) by mouth every 6 (six) hours as needed for nausea or vomiting. 10/05/16   Willy Eddyobinson, Brinly Maietta, MD  valACYclovir (VALTREX) 500 MG tablet Takes 1 tab twice daily for 3 days when you have an outbreak 11/18/14   Lorre MunroeBaity, Regina W, NP    Allergies Bee venom    Social History Social History  Substance Use Topics  . Smoking status: Current Every Day Smoker    Packs/day: 1.00    Years: 14.00    Types: Cigarettes  . Smokeless tobacco: Never Used  . Alcohol use 0.0 oz/week     Comment: occasional    Review of Systems Patient denies headaches, rhinorrhea, blurry vision, numbness, shortness of breath, chest pain, edema, cough, abdominal pain, nausea, vomiting, diarrhea, dysuria, fevers, rashes or hallucinations unless otherwise stated above in HPI. ____________________________________________   PHYSICAL EXAM:  VITAL SIGNS: Vitals:   10/05/16 1245 10/05/16 1558  BP: (!) 150/93 (!) 141/91  Pulse: 90 76  Resp: 20 16  Temp: 98.4 F (36.9 C)     Constitutional: Alert and oriented. Well appearing and in no acute distress.  Eyes: Conjunctivae are normal.  Head: Atraumatic. Nose: No congestion/rhinnorhea. Mouth/Throat: Mucous membranes are moist.   Neck: No stridor. Painless ROM.  Cardiovascular: Normal rate, regular rhythm. Grossly normal heart sounds.  Good peripheral circulation. Respiratory: Normal respiratory effort.  No retractions. Lungs CTAB. Gastrointestinal: Soft with guarding ttp llq.  No diffuse peritonitis.  No distention. No abdominal bruits. No CVA tenderness. Genitourinary: normal external genitalia Musculoskeletal: No lower extremity tenderness nor edema.  No  joint effusions. Neurologic:  Normal speech and language. No gross focal neurologic deficits are appreciated. No facial droop Skin:  Skin is warm, dry and intact. No rash noted. Psychiatric: Mood and affect are normal. Speech and behavior are normal.  ____________________________________________   LABS (all labs ordered are listed, but only abnormal results are displayed)  Results for orders placed or performed during the hospital encounter of 10/05/16 (from the past 24 hour(s))  Lipase, blood     Status: Abnormal   Collection Time: 10/05/16 12:40 PM  Result Value Ref Range   Lipase 57 (H) 11 - 51 U/L  Comprehensive metabolic panel     Status: Abnormal   Collection Time: 10/05/16 12:40 PM  Result Value Ref Range   Sodium 138 135 - 145 mmol/L   Potassium 4.3 3.5 - 5.1 mmol/L   Chloride 105 101 - 111 mmol/L   CO2 24 22 - 32 mmol/L   Glucose, Bld 108 (H) 65 - 99 mg/dL   BUN 15 6 - 20 mg/dL   Creatinine, Ser 1.61 0.61 - 1.24 mg/dL   Calcium 9.8 8.9 - 09.6 mg/dL   Total Protein 7.7 6.5 - 8.1 g/dL   Albumin 4.4 3.5 - 5.0 g/dL   AST 29 15 - 41 U/L   ALT 50 17 - 63 U/L   Alkaline Phosphatase 124 38 - 126 U/L   Total Bilirubin 0.6 0.3 - 1.2 mg/dL   GFR calc non Af Amer >60 >60 mL/min   GFR calc Af Amer >60 >60 mL/min   Anion gap 9 5 - 15  CBC     Status: Abnormal   Collection Time: 10/05/16 12:40 PM  Result Value Ref Range   WBC 12.6 (H) 3.8 - 10.6 K/uL   RBC 5.59 4.40 - 5.90 MIL/uL   Hemoglobin 15.8 13.0 - 18.0 g/dL   HCT 04.5 40.9 - 81.1 %   MCV 83.1 80.0 - 100.0 fL   MCH 28.4 26.0 - 34.0 pg   MCHC 34.1 32.0 - 36.0 g/dL   RDW 91.4 78.2 - 95.6 %   Platelets 259 150 - 440 K/uL  Urinalysis, Complete w Microscopic     Status: Abnormal   Collection Time: 10/05/16 12:50 PM  Result Value Ref Range   Color, Urine YELLOW (A) YELLOW   APPearance CLEAR (A) CLEAR   Specific Gravity, Urine 1.018 1.005 - 1.030   pH 7.0 5.0 - 8.0   Glucose, UA NEGATIVE NEGATIVE mg/dL   Hgb urine  dipstick NEGATIVE NEGATIVE   Bilirubin Urine NEGATIVE NEGATIVE   Ketones, ur NEGATIVE NEGATIVE mg/dL   Protein, ur NEGATIVE NEGATIVE mg/dL   Nitrite NEGATIVE NEGATIVE   Leukocytes, UA NEGATIVE NEGATIVE   RBC / HPF 0-5 0 - 5 RBC/hpf   WBC, UA 0-5 0 - 5 WBC/hpf   Bacteria, UA NONE SEEN NONE SEEN   Squamous Epithelial / LPF NONE SEEN NONE SEEN   Mucous PRESENT    ____________________________________________  EKG____________________________________________  RADIOLOGY  I personally reviewed all radiographic images ordered to evaluate for  the above acute complaints and reviewed radiology reports and findings.  These findings were personally discussed with the patient.  Please see medical record for radiology report.  ____________________________________________   PROCEDURES  Procedure(s) performed:  Procedures    Critical Care performed: no ____________________________________________   INITIAL IMPRESSION / ASSESSMENT AND PLAN / ED COURSE  Pertinent labs & imaging results that were available during my care of the patient were reviewed by me and considered in my medical decision making (see chart for details).  DDX: colitis, diverticulitis, abscess, stone, msk strain  Peter Kramer is a 36 y.o. who presents to the ED with acute left lower quadrant pain as described above. Not clinically consistent with acute appendicitis. Patient does have localized peritonitis to that area without any evidence of kidney stone. Does have evidence of epiploic appendage denies. There is no evidence of fluid collection or perforation. Underlying bowel appears normal. Not consistent with colitis. No evidence of torsion or epididymitis. Patient will be started on antibiotics as well as anti-inflammatories. Discussed signs and symptoms for which patient should return immediately to the hospital.      ____________________________________________   FINAL CLINICAL IMPRESSION(S) / ED  DIAGNOSES  Final diagnoses:  Epiploic appendagitis  Left lower quadrant pain      NEW MEDICATIONS STARTED DURING THIS VISIT:  New Prescriptions   AMOXICILLIN-CLAVULANATE (AUGMENTIN) 875-125 MG TABLET    Take 1 tablet by mouth 2 (two) times daily.   HYDROCODONE-ACETAMINOPHEN (NORCO) 5-325 MG TABLET    Take 1 tablet by mouth every 4 (four) hours as needed for moderate pain.   NAPROXEN (NAPROSYN) 500 MG TABLET    Take 1 tablet (500 mg total) by mouth 2 (two) times daily with a meal.   PROMETHAZINE (PHENERGAN) 12.5 MG TABLET    Take 1 tablet (12.5 mg total) by mouth every 6 (six) hours as needed for nausea or vomiting.     Note:  This document was prepared using Dragon voice recognition software and may include unintentional dictation errors.    Willy Eddy, MD 10/05/16 [phone]

## 2016-10-05 NOTE — ED Triage Notes (Signed)
Pt to ED viz POV, pt states that he has been having pain in his lower abdomen since Tuesday. Pt states that the pain initially in his left testicle but has now moved up to the waistline. Pt Denies N/V or fever. States that he has had a few episodes of diarrhea.

## 2016-10-05 NOTE — ED Notes (Signed)
Pt alert and oriented.  Skin warm and dry. Respirations unlabored. Ambulatory to triage for recheck. NAD. VSS

## 2016-10-05 NOTE — ED Notes (Signed)
Patient transported to CT 

## 2016-10-07 ENCOUNTER — Telehealth: Payer: Self-pay

## 2016-10-07 NOTE — Telephone Encounter (Signed)
Pt called wanting an appt to be seen by Wed... Pt was seen in ED 10/05/16 so this will need to be a hosp f/u--- pt has to go back to work [employer]... Wed we have a 1:00 and 1:45 with a 1:15 CPE... Is it okay for pt to come in for hosp f/u and we block the 1:45 just in case?... Please advise

## 2016-10-08 NOTE — Telephone Encounter (Signed)
yes

## 2016-10-09 ENCOUNTER — Ambulatory Visit (INDEPENDENT_AMBULATORY_CARE_PROVIDER_SITE_OTHER): Payer: BLUE CROSS/BLUE SHIELD | Admitting: Internal Medicine

## 2016-10-09 ENCOUNTER — Encounter: Payer: Self-pay | Admitting: Internal Medicine

## 2016-10-09 VITALS — BP 130/82 | HR 88 | Temp 98.1°F | Wt 177.8 lb

## 2016-10-09 DIAGNOSIS — K529 Noninfective gastroenteritis and colitis, unspecified: Secondary | ICD-10-CM | POA: Diagnosis not present

## 2016-10-09 DIAGNOSIS — K6389 Other specified diseases of intestine: Secondary | ICD-10-CM

## 2016-10-09 MED ORDER — PROMETHAZINE HCL 12.5 MG PO TABS: 12.5000 mg | ORAL_TABLET | Freq: Four times a day (QID) | ORAL | 0 refills | Status: DC | PRN

## 2016-10-09 MED ORDER — HYDROCODONE-ACETAMINOPHEN 5-325 MG PO TABS: 1.0000 | ORAL_TABLET | Freq: Three times a day (TID) | ORAL | 0 refills | Status: DC | PRN

## 2016-10-09 NOTE — Patient Instructions (Signed)

## 2016-10-09 NOTE — Progress Notes (Signed)
Subjective:    Patient ID: Peter Kramer, male    DOB: Dec 12, 1980, 36 y.o.   MRN: [id number]  HPI  Pt presents to the clinic today for ER follow up. He went to the ER 7/14 with c/o LLQ abdominal pain. WBC was mildly elevated. CT renal stone study was consistent with epiploic appendagitis. He was started on Augmentin and advised to follow up with his PCP. Since discharge, he reports they told him he has diverticulitis. He has been taking the medication as prescribed, without relief. He reports associated nausea, bloating and constipation. No vomiting, or diarrhea. He had pain that radiated into his left groin/testicle area, which has since resolved. He denies urgency, frequency, dysuria or blood in his urine. He denies fever, chills or body aches.  He has not tried anything additional OTC for this. He reports he has never had anything like this in the past.  He has FMLA forms that he is requesting be completed. He missed 7/12-7/15, secondary to the illness listed above.  Review of Systems      Past Medical History:  Diagnosis Date  . Chicken pox   . Genital herpes     Current Outpatient Prescriptions  Medication Sig Dispense Refill  . amoxicillin-clavulanate (AUGMENTIN) 875-125 MG tablet Take 1 tablet by mouth 2 (two) times daily. 14 tablet 0  . FLUoxetine (PROZAC) 10 MG capsule Take 1 capsule (10 mg total) by mouth daily. 30 capsule 1  . GINSENG KOREAN PO Take 1 capsule by mouth daily. Reported on 03/31/2015    . HYDROcodone-acetaminophen (NORCO) 5-325 MG tablet Take 1 tablet by mouth every 4 (four) hours as needed for moderate pain. 6 tablet 0  . lisinopril (PRINIVIL,ZESTRIL) 10 MG tablet Take 1 tablet (10 mg total) by mouth daily. (Patient not taking: Reported on 09/04/2016) 30 tablet 0  . naproxen (NAPROSYN) 500 MG tablet Take 1 tablet (500 mg total) by mouth 2 (two) times daily with a meal. 20 tablet 0  . naproxen sodium (ANAPROX) 220 MG tablet Take 220 mg by mouth as needed.    .  promethazine (PHENERGAN) 12.5 MG tablet Take 1 tablet (12.5 mg total) by mouth every 6 (six) hours as needed for nausea or vomiting. 12 tablet 0  . valACYclovir (VALTREX) 500 MG tablet Takes 1 tab twice daily for 3 days when you have an outbreak 30 tablet 0   No current facility-administered medications for this visit.     Allergies  Allergen Reactions  . Bee Venom Swelling    Family History  Problem Relation Age of Onset  . Heart disease Maternal Grandmother   . Cancer Paternal Grandfather        brain  . Diabetes Neg Hx   . Stroke Neg Hx     Social History   Social History  . Marital status: Single    Spouse name: N/A  . Number of children: N/A  . Years of education: N/A   Occupational History  . Not on file.   Social History Main Topics  . Smoking status: Current Every Day Smoker    Packs/day: 1.00    Years: 14.00    Types: Cigarettes  . Smokeless tobacco: Never Used  . Alcohol use 0.0 oz/week     Comment: occasional  . Drug use: No  . Sexual activity: Yes   Other Topics Concern  . Not on file   Social History Narrative  . No narrative on file     Constitutional: Denies fever,  malaise, fatigue, headache or abrupt weight changes.  Gastrointestinal: Pt reports abdominal pain, bloating and constipation. Denies diarrhea or blood in the stool.  GU: Denies urgency, frequency, pain with urination, burning sensation, blood in urine, odor or discharge.   No other specific complaints in a complete review of systems (except as listed in HPI above).  Objective:   Physical Exam   BP 130/82   Pulse 88   Temp 98.1 F (36.7 C) (Oral)   Wt 177 lb 12 oz (80.6 kg)   SpO2 98%   BMI 25.50 kg/m  Wt Readings from Last 3 Encounters:  10/09/16 177 lb 12 oz (80.6 kg)  10/05/16 175 lb (79.4 kg)  09/04/16 183 lb 8 oz (83.2 kg)    General: Appears his stated age, in NAD. Skin: Warm, dry and intact. No rashes, lesions or ulcerations noted. Cardiovascular: Normal rate  and rhythm. S1,S2 noted.  No murmur, rubs or gallops noted.  Pulmonary/Chest: Normal effort and positive vesicular breath sounds. No respiratory distress. No wheezes, rales or ronchi noted.  Abdomen: Soft and tender in the LLQ. Hypoactive bowel sounds. He seems distended in the RLQ. No bulging noted in the groin. Negative for inguinal hernia bilaterally.   BMET    Component Value Date/Time   NA 138 10/05/2016 1240   K 4.3 10/05/2016 1240   CL 105 10/05/2016 1240   CO2 24 10/05/2016 1240   GLUCOSE 108 (H) 10/05/2016 1240   BUN 15 10/05/2016 1240   CREATININE 0.96 10/05/2016 1240   CALCIUM 9.8 10/05/2016 1240   GFRNONAA >60 10/05/2016 1240   GFRAA >60 10/05/2016 1240    Lipid Panel  No results found for: CHOL, TRIG, HDL, CHOLHDL, VLDL, LDLCALC  CBC    Component Value Date/Time   WBC 12.6 (H) 10/05/2016 1240   RBC 5.59 10/05/2016 1240   HGB 15.8 10/05/2016 1240   HCT 46.4 10/05/2016 1240   PLT 259 10/05/2016 1240   MCV 83.1 10/05/2016 1240   MCH 28.4 10/05/2016 1240   MCHC 34.1 10/05/2016 1240   RDW 14.1 10/05/2016 1240    Hgb A1C No results found for: HGBA1C         Assessment & Plan:   ER Follow up for Epiploic Appendagitis:  ER notes, labs and imaging reviewed Continue Augmentin for now Refilled Promethazine eRx for Norco 5-325 mg tabs every 8 hours prn Referral placed to GI for further evaluation  Return precautions discussed Nicki Reaper, NP

## 2016-10-10 ENCOUNTER — Ambulatory Visit (INDEPENDENT_AMBULATORY_CARE_PROVIDER_SITE_OTHER): Payer: BLUE CROSS/BLUE SHIELD | Admitting: Surgery

## 2016-10-10 ENCOUNTER — Telehealth: Payer: Self-pay | Admitting: Internal Medicine

## 2016-10-10 ENCOUNTER — Encounter: Payer: Self-pay | Admitting: Surgery

## 2016-10-10 VITALS — BP 147/80 | HR 84 | Temp 98.3°F | Ht 70.0 in | Wt 177.2 lb

## 2016-10-10 DIAGNOSIS — K529 Noninfective gastroenteritis and colitis, unspecified: Secondary | ICD-10-CM | POA: Diagnosis not present

## 2016-10-10 DIAGNOSIS — K6389 Other specified diseases of intestine: Secondary | ICD-10-CM

## 2016-10-10 NOTE — Progress Notes (Signed)
Surgical Clinic History and Physical  Referring provider:  Lorre Munroe, NP [address] [place], [place] [address]  HISTORY OF PRESENT ILLNESS (HPI):  36 y.o. male presents for evaluation of epiploic appendagitis recently diagnosed anterior to the proximal sigmoid colon on [facility] ED CT when patient presented 5 days ago for LLQ abdominal pain x 3 days. Patient reports the pain began gradually and progressively worsened, ultimately radiating to his Left groin, which has since resolved. Overall, patient describes his LLQ pain has significantly improved, but he expresses concern re: diagnosis. Since other family members have experienced severe pain with passing kidney stones, he presented to [facility] ED accordingly. CT incidentally also showed punctate non-obstructing Left nephrolithiasis without evidence of ureterolithiasis. Patient reports a brief episode of low-grade subjective fever ~3 days ago, but otherwise denies fever/chills, N/V, diarrhea, constipation, CP, or SOB. Upon noticing a <1 cm Left neck mass during patient's exam today, he mentioned that the mass "hurts sometimes", has not previously been infected.  PAST MEDICAL HISTORY (PMH):  Past Medical History:  Diagnosis Date  . Chicken pox   . Essential hypertension 08/08/2016  . Generalized anxiety disorder 07/17/2015  . Genital herpes   . Smoker 11/18/2014     PAST SURGICAL HISTORY (PSH):  History reviewed. No pertinent surgical history.   MEDICATIONS:  Prior to Admission medications   Medication Sig Start Date End Date Taking? Authorizing Provider  amoxicillin-clavulanate (AUGMENTIN) 875-125 MG tablet Take 1 tablet by mouth 2 (two) times daily. 10/05/16 10/12/16 Yes Willy Eddy, MD  FLUoxetine (PROZAC) 10 MG capsule Take 1 capsule (10 mg total) by mouth daily. 10/01/16  Yes Baity, Salvadore Oxford, NP  GINSENG KOREAN PO Take 1 capsule by mouth daily. Reported on 03/31/2015   Yes [provider]  HYDROcodone-acetaminophen  (NORCO) 5-325 MG tablet Take 1 tablet by mouth every 8 (eight) hours as needed for moderate pain. 10/09/16  Yes Baity, Salvadore Oxford, NP  lisinopril (PRINIVIL,ZESTRIL) 10 MG tablet Take 1 tablet (10 mg total) by mouth daily. 08/08/16  Yes Lorre Munroe, NP  naproxen (NAPROSYN) 500 MG tablet Take 1 tablet (500 mg total) by mouth 2 (two) times daily with a meal. 10/05/16 10/15/16 Yes Willy Eddy, MD  naproxen sodium (ANAPROX) 220 MG tablet Take 220 mg by mouth as needed.   Yes [provider]  promethazine (PHENERGAN) 12.5 MG tablet Take 1 tablet (12.5 mg total) by mouth every 6 (six) hours as needed for nausea or vomiting. 10/09/16  Yes Baity, Salvadore Oxford, NP  valACYclovir (VALTREX) 500 MG tablet Takes 1 tab twice daily for 3 days when you have an outbreak 11/18/14  Yes Baity, Salvadore Oxford, NP     ALLERGIES:  Allergies  Allergen Reactions  . Bee Venom Swelling     SOCIAL HISTORY:  Social History   Social History  . Marital status: Single    Spouse name: N/A  . Number of children: N/A  . Years of education: N/A   Occupational History  . Not on file.   Social History Main Topics  . Smoking status: Current Every Day Smoker    Packs/day: 1.00    Years: 14.00    Types: Cigarettes  . Smokeless tobacco: Never Used  . Alcohol use 0.0 oz/week     Comment: occasional  . Drug use: No  . Sexual activity: Yes   Other Topics Concern  . Not on file   Social History Narrative  . No narrative on file  The patient currently resides (home / rehab facility / nursing home): Home  The patient normally is (ambulatory / bedbound): Ambulatory   FAMILY HISTORY:  Family History  Problem Relation Age of Onset  . Heart disease Maternal Grandmother   . Cancer Paternal Grandfather        brain  . Diabetes Neg Hx   . Stroke Neg Hx     Otherwise negative/non-contributory.  REVIEW OF SYSTEMS:  Constitutional: denies any other weight loss, fever, chills, or sweats  Eyes: denies any other  vision changes, history of eye injury  ENT: denies sore throat, hearing problems  Respiratory: denies shortness of breath, wheezing  Cardiovascular: denies chest pain, palpitations  Gastrointestinal: abdominal pain, N/V, and bowel function as per HPI Musculoskeletal: denies any other joint pains or cramps  Skin: Denies any other rashes or skin discolorations  Neurological: denies any other headache, dizziness, weakness  Psychiatric: Denies any other depression, anxiety   All other review of systems were otherwise negative   VITAL SIGNS:  BP (!) 147/80   Pulse 84   Temp 98.3 F (36.8 C) (Oral)   Ht 5\' 10"  (1.778 m)   Wt 177 lb 3.2 oz (80.4 kg)   BMI 25.43 kg/m   PHYSICAL EXAM:  Constitutional:  -- Normal body habitus  -- Awake, alert, and oriented x3  Eyes:  -- Pupils equally round and reactive to light  -- No scleral icterus  Ear, nose, throat:  -- No jugular venous distension -- No nasal drainage, bleeding Pulmonary:  -- No crackles  -- Equal breath sounds bilaterally -- Breathing non-labored at rest Cardiovascular:  -- S1, S2 present  -- No pericardial rubs  Gastrointestinal:  -- Abdomen soft and non-distended with moderate LLQ abdominal tenderness to palpation, no guarding/rebound  -- No abdominal masses appreciated, pulsatile or otherwise  Musculoskeletal and Integumentary:  -- Wounds or skin discoloration: Mildly tender 8 mm x 8 mm Left neck subcutaneous mass without drainage or surrounding erythema -- Extremities: B/L UE and LE FROM, hands and feet warm, no edema  Neurologic:  -- Motor function: Intact and symmetric -- Sensation: Intact and symmetric  Labs:  CBC:  Lab Results  Component Value Date   WBC 12.6 (H) 10/05/2016   RBC 5.59 10/05/2016   BMP:  Lab Results  Component Value Date   GLUCOSE 108 (H) 10/05/2016   CO2 24 10/05/2016   BUN 15 10/05/2016   CREATININE 0.96 10/05/2016   CALCIUM 9.8 10/05/2016     Imaging studies:  CT Abdomen and  Pelvis with PO Contrast (10/05/2016) - personally reviewed images with patient in office Urinary Tract: Punctate nonobstructing stone in the upper pole of the left kidney. No ureteral stones or hydronephrosis. Adrenal glands and urinary bladder unremarkable.  Stomach/Bowel: Appendix is normal. Stomach, large and small bowel grossly unremarkable. There is stranding anterior to the proximal sigmoid colon which appears separate from the sigmoid colon, likely epiploic appendagitis.  Vascular/Lymphatic: No evidence of aneurysm or adenopathy.  Assessment/Plan:  36 y.o. male with epiploic appendagitis and mildly symptomatic 8 mm x 8 mm subcutaneous Left neck mass without evidence of infection, complicated by co-morbidities including chronic tobacco abuse, HTN, and generalized anxiety disorder.   - no indication for surgical intervention  - NSAIDs prn for pain and inflammation  - complete course of antibiotics as prescribed  - okay to return to work as long as not requiring to drive while taking narcotic pain medication  - also discussed with patient elective excision  of Left neck mass (likely symptomatic sebaceous cyst)  - importance of smoking cessation discussed to reduce risk of amputation, stroke, respiratory difficulty  - return to clinic as needed, instructed patient to call if any questions or concerns  All of the above recommendations were discussed with the patient, and all of patient's questions were answered to his expressed satisfaction.  Thank you for the opportunity to participate in this patient's care.  -- Scherrie Gerlach Earlene Plater, MD, RPVI Pink: [facility] - Partnering for exceptional care. Office: [phone]

## 2016-10-10 NOTE — Telephone Encounter (Signed)
fmla paperwork in regina in box For review and signature

## 2016-10-10 NOTE — Telephone Encounter (Signed)
Done, placed in my outbox.

## 2016-10-10 NOTE — Patient Instructions (Signed)
Please call with any questions or concerns.

## 2016-10-11 NOTE — Telephone Encounter (Signed)
Spoke to pt he will have spouse pick up paperwork He also wanted to me put his appointment date with gi dr on paperwork  That date was 10/10/16

## 2016-10-11 NOTE — Telephone Encounter (Signed)
Left message letting pt know paperwork has been completed.  There is no fax for me to fax paperwork pt will need to run this in

## 2016-12-19 ENCOUNTER — Other Ambulatory Visit: Payer: Self-pay | Admitting: Internal Medicine

## 2016-12-19 ENCOUNTER — Telehealth: Payer: Self-pay

## 2016-12-19 DIAGNOSIS — F411 Generalized anxiety disorder: Secondary | ICD-10-CM

## 2016-12-19 NOTE — Telephone Encounter (Signed)
Pt left v/m requesting referral to [facility]. Unable to reach pt for more info. DPR allows to speak with no one else.

## 2016-12-19 NOTE — Telephone Encounter (Signed)
Referral placed.

## 2017-01-08 ENCOUNTER — Encounter: Payer: Self-pay | Admitting: Emergency Medicine

## 2017-01-08 ENCOUNTER — Telehealth: Payer: Self-pay | Admitting: Internal Medicine

## 2017-01-08 ENCOUNTER — Emergency Department
Admission: EM | Admit: 2017-01-08 | Discharge: 2017-01-08 | Disposition: A | Discharge status: Home / Self Care | Payer: BLUE CROSS/BLUE SHIELD | Attending: Emergency Medicine | Admitting: Emergency Medicine

## 2017-01-08 DIAGNOSIS — Z79899 Other long term (current) drug therapy: Secondary | ICD-10-CM | POA: Insufficient documentation

## 2017-01-08 DIAGNOSIS — I10 Essential (primary) hypertension: Secondary | ICD-10-CM | POA: Insufficient documentation

## 2017-01-08 DIAGNOSIS — K922 Gastrointestinal hemorrhage, unspecified: Secondary | ICD-10-CM | POA: Insufficient documentation

## 2017-01-08 DIAGNOSIS — F17210 Nicotine dependence, cigarettes, uncomplicated: Secondary | ICD-10-CM | POA: Insufficient documentation

## 2017-01-08 LAB — CBC
HCT: 44 % (ref 40.0–52.0)
HEMOGLOBIN: 15.2 g/dL (ref 13.0–18.0)
MCH: 29.2 pg (ref 26.0–34.0)
MCHC: 34.6 g/dL (ref 32.0–36.0)
MCV: 84.5 fL (ref 80.0–100.0)
PLATELETS: 249 10*3/uL (ref 150–440)
RBC: 5.21 MIL/uL (ref 4.40–5.90)
RDW: 14.5 % (ref 11.5–14.5)
WBC: 10.5 10*3/uL (ref 3.8–10.6)

## 2017-01-08 LAB — COMPREHENSIVE METABOLIC PANEL
ALK PHOS: 91 U/L (ref 38–126)
ALT: 40 U/L (ref 17–63)
ANION GAP: 8 (ref 5–15)
AST: 26 U/L (ref 15–41)
Albumin: 4.4 g/dL (ref 3.5–5.0)
BILIRUBIN TOTAL: 0.4 mg/dL (ref 0.3–1.2)
BUN: 18 mg/dL (ref 6–20)
CALCIUM: 9.3 mg/dL (ref 8.9–10.3)
CO2: 27 mmol/L (ref 22–32)
CREATININE: 0.94 mg/dL (ref 0.61–1.24)
Chloride: 103 mmol/L (ref 101–111)
GFR calc non Af Amer: >60 mL/min (ref >60)
GLUCOSE: 99 mg/dL (ref 65–99)
Potassium: 4 mmol/L (ref 3.5–5.1)
Sodium: 138 mmol/L (ref 135–145)
TOTAL PROTEIN: 7.1 g/dL (ref 6.5–8.1)

## 2017-01-08 LAB — TYPE AND SCREEN
ABO/RH(D): AB NEG
ANTIBODY SCREEN: NEGATIVE

## 2017-01-08 MED ORDER — OMEPRAZOLE 40 MG PO CPDR: 40.0000 mg | DELAYED_RELEASE_CAPSULE | Freq: Every day | ORAL | 0 refills | Status: DC

## 2017-01-08 NOTE — Discharge Instructions (Signed)
Please stop using all NSAID medications including ibuprofen, motrin, advil, naproxen, aleve, and aspirin.  They can cause your stomach to bleed and make your symptoms worse.  Please begin taking your antacid medication once a day for the next month and follow-up with your primary care physician this month for a reexamination. Schedule an appointment to see the gastroenterologist in a month or so for a recheck. Please return to the emergency department sooner for any new or worsening symptoms such as if you pass out, if your bleeding worsens, or for any other concerns whatsoever.  It was a pleasure to take care of you today, and thank you for coming to our emergency department.  If you have any questions or concerns before leaving please ask the nurse to grab me and I'm more than happy to go through your aftercare instructions again.  If you were prescribed any opioid pain medication today such as Norco, Vicodin, Percocet, morphine, hydrocodone, or oxycodone please make sure you do not drive when you are taking this medication as it can alter your ability to drive safely.  If you have any concerns once you are home that you are not improving or are in fact getting worse before you can make it to your follow-up appointment, please do not hesitate to call [phone] and come back for further evaluation.  Peter BrittleNeil Atasha Colebank, MD  Results for orders placed or performed during the hospital encounter of 01/08/17  Comprehensive metabolic panel  Result Value Ref Range   Sodium 138 135 - 145 mmol/L   Potassium 4.0 3.5 - 5.1 mmol/L   Chloride 103 101 - 111 mmol/L   CO2 27 22 - 32 mmol/L   Glucose, Bld 99 65 - 99 mg/dL   BUN 18 6 - 20 mg/dL   Creatinine, Ser 1.610.94 0.61 - 1.24 mg/dL   Calcium 9.3 8.9 - 09.610.3 mg/dL   Total Protein 7.1 6.5 - 8.1 g/dL   Albumin 4.4 3.5 - 5.0 g/dL   AST 26 15 - 41 U/L   ALT 40 17 - 63 U/L   Alkaline Phosphatase 91 38 - 126 U/L   Total Bilirubin 0.4 0.3 - 1.2 mg/dL   GFR calc non Af  Amer >60 >60 mL/min   GFR calc Af Amer >60 >60 mL/min   Anion gap 8 5 - 15  CBC  Result Value Ref Range   WBC 10.5 3.8 - 10.6 K/uL   RBC 5.21 4.40 - 5.90 MIL/uL   Hemoglobin 15.2 13.0 - 18.0 g/dL   HCT 04.544.0 40.940.0 - 81.152.0 %   MCV 84.5 80.0 - 100.0 fL   MCH 29.2 26.0 - 34.0 pg   MCHC 34.6 32.0 - 36.0 g/dL   RDW 91.414.5 78.211.5 - 95.614.5 %   Platelets 249 150 - 440 K/uL  Type and screen [facility]  Result Value Ref Range   ABO/RH(D) AB NEG    Antibody Screen NEG    Sample Expiration 01/11/2017

## 2017-01-08 NOTE — Telephone Encounter (Signed)
I spoke with pt and he has [facility].

## 2017-01-08 NOTE — ED Provider Notes (Signed)
[facility] Emergency Department Provider Note  ____________________________________________   First MD Initiated Contact with Patient 01/08/17 1358     (approximate)  I have reviewed the triage vital signs and the nursing notes.   HISTORY  Chief Complaint Rectal Bleeding    HPI Peter Kramer is a 36 y.o. male history of present emergency department with 1 day of hematochezia. For the past 2 weeks or so he has had intermittent episodes of dyschezia associated with slightly loose stools. He is also had vague upper abdominal discomfort. Today he had a large bowel movement which was painless and noted black stools intermixed with bright red blood which concerned him and prompted the visit to the emergency department. He has no past medical history and takes no prescribed medications. He does use naproxen daily for the past several years secondary to daily aches and pains. He does not drink alcohol. His hematochezia came on suddenly has only occurred once and nothing in particular seems to make it better or worse.   Past Medical History:  Diagnosis Date  . Chicken pox   . Essential hypertension 08/08/2016  . Generalized anxiety disorder 07/17/2015  . Genital herpes   . Smoker 11/18/2014    Patient Active Problem List   Diagnosis Date Noted  . Essential hypertension 08/08/2016  . Generalized anxiety disorder 07/17/2015  . Genital herpes 11/18/2014  . Smoker 11/18/2014    History reviewed. No pertinent surgical history.  Prior to Admission medications   Medication Sig Start Date End Date Taking? Authorizing Provider  FLUoxetine (PROZAC) 10 MG capsule Take 1 capsule (10 mg total) by mouth daily. 10/01/16   Lorre MunroeBaity, Regina W, NP  GINSENG KOREAN PO Take 1 capsule by mouth daily. Reported on 03/31/2015    [provider]  HYDROcodone-acetaminophen (NORCO) 5-325 MG tablet Take 1 tablet by mouth every 8 (eight) hours as needed for moderate pain. 10/09/16    Lorre MunroeBaity, Regina W, NP  lisinopril (PRINIVIL,ZESTRIL) 10 MG tablet Take 1 tablet (10 mg total) by mouth daily. 08/08/16   Lorre MunroeBaity, Regina W, NP  naproxen sodium (ANAPROX) 220 MG tablet Take 220 mg by mouth as needed.    [provider]  omeprazole (PRILOSEC) 40 MG capsule Take 1 capsule (40 mg total) by mouth daily. 01/08/17 01/08/18  Merrily Brittleifenbark, Kasie Leccese, MD  promethazine (PHENERGAN) 12.5 MG tablet Take 1 tablet (12.5 mg total) by mouth every 6 (six) hours as needed for nausea or vomiting. 10/09/16   Lorre MunroeBaity, Regina W, NP  valACYclovir (VALTREX) 500 MG tablet Takes 1 tab twice daily for 3 days when you have an outbreak 11/18/14   Lorre MunroeBaity, Regina W, NP    Allergies Bee venom  Family History  Problem Relation Age of Onset  . Heart disease Maternal Grandmother   . Cancer Paternal Grandfather        brain  . Diabetes Neg Hx   . Stroke Neg Hx     Social History Social History  Substance Use Topics  . Smoking status: Current Every Day Smoker    Packs/day: 1.00    Years: 14.00    Types: Cigarettes  . Smokeless tobacco: Never Used  . Alcohol use 0.0 oz/week     Comment: occasional    Review of Systems Constitutional: No fever/chills ENT: No sore throat. Cardiovascular: Denies chest pain. Respiratory: Denies shortness of breath. Gastrointestinal: positive for abdominal pain.  No nausea, no vomiting.  No diarrhea.  No constipation. Musculoskeletal: Negative for back pain. Neurological: Negative  for headaches   ____________________________________________   PHYSICAL EXAM:  VITAL SIGNS: ED Triage Vitals  Enc Vitals Group     BP 01/08/17 1226 (!) 156/98     Pulse Rate 01/08/17 1226 84     Resp 01/08/17 1226 16     Temp 01/08/17 1226 98.7 F (37.1 C)     Temp Source 01/08/17 1226 Oral     SpO2 01/08/17 1226 100 %     Weight 01/08/17 1227 185 lb (83.9 kg)     Height 01/08/17 1227 5\' 10"  (1.778 m)     Head Circumference --      Peak Flow --      Pain Score 01/08/17 1234 0      Pain Loc --      Pain Edu? --      Excl. in GC? --     Constitutional: alert and oriented 4 well appearing nontoxic no diaphoresis speaks in full clear sentences Head: Atraumatic. Nose: No congestion/rhinnorhea. Mouth/Throat: No trismus Neck: No stridor.   Cardiovascular: regular rate and rhythm Respiratory: Normal respiratory effort.  No retractions. Gastrointestinal: soft nondistended nontender no rebound or guarding no peritonitis no McBurney's tenderness negative Rovsing's no costovertebral tenderness No sentinel pile, no fissure noted, no hemorrhoids. Guaiac positive control positive brown stool Neurologic:  Normal speech and language. No gross focal neurologic deficits are appreciated.  Skin:  Skin is warm, dry and intact. No rash noted.    ____________________________________________  LABS (all labs ordered are listed, but only abnormal results are displayed)  Labs Reviewed  COMPREHENSIVE METABOLIC PANEL  CBC  POC OCCULT BLOOD, ED  TYPE AND SCREEN    blood work reviewed and interpreted by me shows no signs of anemia __________________________________________  EKG   ____________________________________________  RADIOLOGY   ____________________________________________   DIFFERENTIAL includes but not limited to  gastritis, gastric perforation, diverticulitis, rectal fissure, bleeding hemorrhoids   PROCEDURES  Procedure(s) performed: no  Procedures  Critical Care performed: no  Observation: no ____________________________________________   INITIAL IMPRESSION / ASSESSMENT AND PLAN / ED COURSE  Pertinent labs & imaging results that were available during my care of the patient were reviewed by me and considered in my medical decision making (see chart for details).  The patient arrives hemodynamically stable and well appearing. His abdominal exam is benign. Rectal exam confirms guaiac positive although brown stool. He and I had a lengthy discussion  regarding his black stools and that these likely represent an upper GI bleed secondary from nonsteroidal use. He has no indication for emergent blood transfusion or inpatient admission. I've advised him to stop taking all nonsteroidal medications and will begin him on a PPI for the next month. I will also refer him back to his primary care physician as well as gastroenterology for possible outpatient endoscopy versus colonoscopy. Strict return precautions are given and the patient verbalizes understanding and agreement with the plan.      ____________________________________________   FINAL CLINICAL IMPRESSION(S) / ED DIAGNOSES  Final diagnoses:  Gastrointestinal hemorrhage, unspecified gastrointestinal hemorrhage type      NEW MEDICATIONS STARTED DURING THIS VISIT:  New Prescriptions   OMEPRAZOLE (PRILOSEC) 40 MG CAPSULE    Take 1 capsule (40 mg total) by mouth daily.     Note:  This document was prepared using Dragon voice recognition software and may include unintentional dictation errors.      Merrily Brittle, MD 01/08/17 1416

## 2017-01-08 NOTE — Telephone Encounter (Signed)
[facility] - Client TELEPHONE ADVICE RECORD [facility]  Patient Name: Peter LambertDAVID Kramer  DOB: 10-Feb-1981    Initial Comment Pt has blood in his stool.   Nurse Assessment  Nurse: Anner CreteWells, RN, Olegario MessierKathy Date/Time (Eastern Time): 01/08/2017 10:32:19 AM  Confirm and document reason for call. If symptomatic, describe symptoms. ---Caller stated that he has blood in his bowel movements and a small of amount of blood passed while not having a bowel movement .  Does the patient have any new or worsening symptoms? ---Yes  Will a triage be completed? ---Yes  Related visit to physician within the last 2 weeks? ---No  Does the PT have any chronic conditions? (i.e. diabetes, asthma, etc.) ---No  Is this a behavioral health or substance abuse call? ---No     Guidelines    Guideline Title Affirmed Question Affirmed Notes  Rectal Bleeding Tarry or jet black-colored stool (not dark green)    Final Disposition User   Go to ED Now Anner CreteWells, RN, [employer]athy    Referrals  [facility] - [facility]

## 2017-01-08 NOTE — ED Triage Notes (Signed)
C/O "small stools" x 1 week and intermittent rectal bleeding.

## 2017-01-27 ENCOUNTER — Ambulatory Visit (INDEPENDENT_AMBULATORY_CARE_PROVIDER_SITE_OTHER): Payer: Self-pay | Admitting: Psychiatry

## 2017-01-27 ENCOUNTER — Encounter: Payer: Self-pay | Admitting: Psychiatry

## 2017-01-27 ENCOUNTER — Other Ambulatory Visit: Payer: Self-pay

## 2017-01-27 VITALS — BP 164/95 | HR 92 | Temp 98.8°F | Wt 181.6 lb

## 2017-01-27 DIAGNOSIS — Z8659 Personal history of other mental and behavioral disorders: Secondary | ICD-10-CM

## 2017-01-27 DIAGNOSIS — F1211 Cannabis abuse, in remission: Secondary | ICD-10-CM

## 2017-01-27 DIAGNOSIS — F988 Other specified behavioral and emotional disorders with onset usually occurring in childhood and adolescence: Secondary | ICD-10-CM | POA: Insufficient documentation

## 2017-01-27 DIAGNOSIS — R030 Elevated blood-pressure reading, without diagnosis of hypertension: Secondary | ICD-10-CM

## 2017-01-27 DIAGNOSIS — F17200 Nicotine dependence, unspecified, uncomplicated: Secondary | ICD-10-CM

## 2017-01-27 NOTE — Progress Notes (Signed)
Psychiatric Initial Adult Assessment   Patient Identification: Peter Kramer MRN:  [id number] Date of Evaluation:  01/27/2017 Referral Source: Nicki Reaper NP  Chief Complaint:  " I cannot focus.'  Chief Complaint    Establish Care; ADHD     Visit Diagnosis:    ICD-10-CM   1. History of attention deficit hyperactivity disorder (ADHD) Z86.59   2. Elevated BP without diagnosis of hypertension R03.0   3. Cannabis use disorder, mild, in early remission F12.11   4. Tobacco use disorder F17.200     History of Present Illness: Peter Kramer is a 36 year old Caucasian male, married, employed, who lives in [place] has a history of attention and concentration deficit, present to the clinic here to establish care.  [name] today reports that he has been struggling with some attention and focus issues at work.  He reports that he was diagnosed with ADHD in the past.  His general practitioner started him on Vyvanse at that time.  He reports that he took it for like 2 years.  He reports that it was the most clear 2 years that he had in his life.  He stopped taking it in 2016.  He reports that he has been struggling with a lot of symptoms of ADHD and wants to get back on his medication.  He reports problems with focus at work.  He has been making careless mistakes at work.  He reports that he has been very forgetful, has been falling back on his assignments, deadlines.  He reports he cannot sit still.  He reports that he has been very impulsive. He reports that he did not graduate high school because of his attention problems while at school.  He denies any mood symptoms, except for some anxiety about his situation.  He reports that he is the only person who works in his family.  He has a wife, one small kid and one is on the way.  He reports that he is worried about his financial situation.  He denies any depressive symptoms, he denies sleep issues, denies appetite changes.  Denies suicidal ideation or  homicidal ideation.  Denies any perceptual disturbances.  He denies any substance abuse issues.  He reports he used to smoke cannabis in the past.  The last time he smoked was 4-5 months ago.  He does report a recent emergency department visit for GI bleeding.  He reports that it was a one-time event.  He reports he has a history of being in pain.  The pain usually affects his hands and arms.  He is a Chartered certified accountant.  He reports he has difficulty starting his work at the beginning of the day due to his pain issues.  He hence was taking Aleve on a daily basis.  He reports that this would have the reason for his GI bleeding.  He does not take Aleve or any NSAIDs anymore.  He has been managing his pain by stretching exercises and that has been going on well.  Reports he did not have any more GI bleeding episodes after that.    Associated Signs/Symptoms: Depression Symptoms:  mild anxiety sx (Hypo) Manic Symptoms:  denies Anxiety Symptoms:  worry about his situation, finances Psychotic Symptoms:  denies PTSD Symptoms: Negative  Past Psychiatric History: Reports hx of ADHD. He was on Vyvanse in the past.  Prescribed by Dr. Bethena Midget his primary medical doctor.  He reports he has never been treated for any mood sx or other mental illness. Denies past  hx of IP admission. Denies past hx of suicide.   Previous Psychotropic Medications: Yes , vyvanse ( stopped taking in 2016)   Substance Abuse History in the last 12 months:  Yes.  used to smoke cannabis , denies now.  Consequences of Substance Abuse: Negative  Past Medical History:  Past Medical History:  Diagnosis Date  . ADHD (attention deficit hyperactivity disorder)   . Chicken pox   . Essential hypertension 08/08/2016  . Generalized anxiety disorder 07/17/2015  . Genital herpes   . Smoker 11/18/2014   History reviewed. No pertinent surgical history.  Family Psychiatric History: Reports his 2 sisters have ADHD, father has ADHD.  Denies other  psychiatric problems in the family, denies suicide in the family, denies substance abuse in the family.  Family History:  Family History  Problem Relation Age of Onset  . ADD / ADHD Father   . Heart disease Maternal Grandmother   . Cancer Paternal Grandfather        brain  . ADD / ADHD Sister   . ADD / ADHD Sister   . Diabetes Neg Hx   . Stroke Neg Hx     Social History:   Social History   Socioeconomic History  . Marital status: Married    Spouse name: emily  . Number of children: 1  . Years of education: None  . Highest education level: GED or equivalent  Social Needs  . Financial resource strain: Very hard  . Food insecurity - worry: Sometimes true  . Food insecurity - inability: Sometimes true  . Transportation needs - medical: No  . Transportation needs - non-medical: No  Occupational History  . Occupation: [employer]    Comment: full time  Tobacco Use  . Smoking status: Current Every Day Smoker    Packs/day: 1.00    Years: 14.00    Pack years: 14.00    Types: Cigarettes  . Smokeless tobacco: Never Used  Substance and Sexual Activity  . Alcohol use: No    Alcohol/week: 0.0 oz    Frequency: Never    Comment: occasional  . Drug use: No  . Sexual activity: Yes    Birth control/protection: None  Other Topics Concern  . None  Social History Narrative  . None    Additional Social History: He was raised by both his parents.  His parents are currently divorced.  [facility] [place].  He is married.  He has 1 child, one on the way.  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  Allergies:   Allergies  Allergen Reactions  . Bee Venom Swelling    Metabolic Disorder Labs: No results found for: HGBA1C, MPG No results found for: PROLACTIN No results found for: CHOL, TRIG, HDL, CHOLHDL, VLDL, LDLCALC   Current Medications: Current Outpatient Medications  Medication Sig Dispense Refill  . GINSENG KOREAN PO Take 1 capsule by mouth daily. Reported on  03/31/2015    . omeprazole (PRILOSEC) 40 MG capsule Take 1 capsule (40 mg total) by mouth daily. (Patient not taking: Reported on 01/27/2017) 30 capsule 0  . valACYclovir (VALTREX) 500 MG tablet Takes 1 tab twice daily for 3 days when you have an outbreak (Patient not taking: Reported on 01/27/2017) 30 tablet 0   No current facility-administered medications for this visit.     Neurologic: Headache: No Seizure: No Paresthesias:No  Musculoskeletal: Strength & Muscle Tone: within normal limits Gait & Station: normal Patient leans: N/A  Psychiatric Specialty Exam:  Review of Systems  Psychiatric/Behavioral: The patient is nervous/anxious.   All other systems reviewed and are negative.   Blood pressure (!) 164/95, pulse 92, temperature 98.8 F (37.1 C), temperature source Oral, weight 181 lb 9.6 oz (82.4 kg).Body mass index is 26.06 kg/m.  General Appearance: Casual  Eye Contact:  Fair  Speech:  Normal Rate  Volume:  Normal  Mood:  Anxious  Affect:  Appropriate  Thought Process:  Goal Directed and Descriptions of Associations: Intact  Orientation:  Full (Time, Place, and Person)  Thought Content:  Logical  Suicidal Thoughts:  No  Homicidal Thoughts:  No  Memory:  Immediate;   Fair Recent;   Fair Remote;   Fair  Judgement:  Fair  Insight:  Fair  Psychomotor Activity:  Normal  Concentration:  Concentration: Fair and Attention Span: Fair  Recall:  [employer] of Knowledge:Fair  Language: Fair  Akathisia:  No  Handed:  Right  AIMS (if indicated):  NA  Assets:  Communication Skills Desire for Improvement Housing Intimacy Physical Health Social Support Talents/Skills Transportation Vocational/Educational  ADL's:  Intact  Cognition: WNL  Sleep:  NA    Treatment Plan Summary: Izaiyah is a 36 year old Caucasian male who has a history of attention and concentration deficit, who presents to the clinic today to establish care.  Caulder currently denies any mood symptoms except for  some situational anxiety about his circumstances.  He denies any significant trauma in the past.He denies any significant substance abuse issues his family history is positive for ADHD.  He is a good candidate for outpatient treatment. Medication management and Plan see below   Plan For attention deficit Discussed non stimulant medication, patient is not interested. We will refer for ADHD testing since he has no records with him about his past diagnosis.  [facility]  For elevated blood pressure Patient reports he is currently not on any medications. Discussed to monitor closely, follow-up with his primary medical doctor.  Cannabis use in early remission Patient reports that he is aware of consequences, he does not use it anymore.  Tobacco use disorder Provided smoking cessation counseling He reports he is trying to quit, will continue to assist this in future appointments.  Reviewed medical records in the chart.  Follow-up in 4 weeks or sooner if needed.  More than 50 % of the time was spent for psychoeducation and supportive psychotherapy and care coordination.  This note was generated in part or whole with voice recognition software. Voice recognition is usually quite accurate but there are transcription errors that can and very often do occur. I apologize for any typographical errors that were not detected and corrected.       Jomarie Longs, MD 11/5/201812:54 PM

## 2017-01-27 NOTE — Patient Instructions (Signed)
PLEASE MAKE AN APPOINTMENT AFTER YOUR TESTING RESULT COMES BACK.

## 2017-01-28 ENCOUNTER — Telehealth: Payer: Self-pay

## 2017-01-28 NOTE — Telephone Encounter (Signed)
left message that insurance was not active and that we need him to bring updated insurance information or he would be billed as self pay.

## 2017-02-26 ENCOUNTER — Other Ambulatory Visit: Payer: Self-pay

## 2017-02-26 ENCOUNTER — Encounter: Payer: Self-pay | Admitting: Psychiatry

## 2017-02-26 ENCOUNTER — Ambulatory Visit (INDEPENDENT_AMBULATORY_CARE_PROVIDER_SITE_OTHER): Payer: Self-pay | Admitting: Psychiatry

## 2017-02-26 VITALS — BP 150/94 | HR 88 | Temp 98.8°F | Wt 182.8 lb

## 2017-02-26 DIAGNOSIS — F900 Attention-deficit hyperactivity disorder, predominantly inattentive type: Secondary | ICD-10-CM

## 2017-02-26 DIAGNOSIS — F17200 Nicotine dependence, unspecified, uncomplicated: Secondary | ICD-10-CM

## 2017-02-26 DIAGNOSIS — F1211 Cannabis abuse, in remission: Secondary | ICD-10-CM

## 2017-02-26 DIAGNOSIS — R030 Elevated blood-pressure reading, without diagnosis of hypertension: Secondary | ICD-10-CM

## 2017-02-26 MED ORDER — AMPHETAMINE-DEXTROAMPHET ER 20 MG PO CP24: 20.0000 mg | ORAL_CAPSULE | Freq: Every day | ORAL | 0 refills | Status: DC

## 2017-02-26 NOTE — Patient Instructions (Signed)
Amphetamine; Dextroamphetamine extended-release capsules What is this medicine? AMPHETAMINE; DEXTROAMPHETAMINE (am FET a meen; dex troe am FET a meen) is used to treat attention-deficit hyperactivity disorder (ADHD). Federal law prohibits giving this medicine to any person other than the person for whom it was prescribed. Do not share this medicine with anyone else. This medicine may be used for other purposes; ask your health care provider or pharmacist if you have questions. COMMON BRAND NAME(S): Adderall XR, Mydayis What should I tell my health care provider before I take this medicine? They need to know if you have any of these conditions: -anxiety or panic attacks -circulation problems in fingers and toes -glaucoma -hardening or blockages of the arteries or heart blood vessels -heart disease or a heart defect -high blood pressure -history of a drug or alcohol abuse problem -history of stroke -kidney disease -liver disease -mental illness -seizures -suicidal thoughts, plans, or attempt; a previous suicide attempt by you or a family member -thyroid disease -Tourette's syndrome -an unusual or allergic reaction to dextroamphetamine, other amphetamines, other medicines, foods, dyes, or preservatives -pregnant or trying to get pregnant -breast-feeding How should I use this medicine? Take this medicine by mouth with a glass of water. Follow the directions on the prescription label. This medicine is taken just one time per day, usually in the morning after waking up. Take with or without food. Do not chew or crush this medicine. You may open the capsules and sprinkle the medicine on a spoonful of applesauce. If sprinkled on applesauce, take the dose immediately and do not crush or chew. Always drink a glass of water or other liquid after taking this medicine. Do not take your medicine more often than directed. A special MedGuide will be given to you by the pharmacist with each prescription  and refill. Be sure to read this information carefully each time. Talk to your pediatrician regarding the use of this medicine in children. While this drug may be prescribed for children as young as 6 years for selected conditions, precautions do apply. Overdosage: If you think you have taken too much of this medicine contact a poison control center or emergency room at once. NOTE: This medicine is only for you. Do not share this medicine with others. What if I miss a dose? If you miss a dose, take it as soon as you can. If it is almost time for your next dose, take only that dose. Do not take double or extra doses. What may interact with this medicine? Do not take this medicine with any of the following medications: -MAOIs like Carbex, Eldepryl, Marplan, Nardil, and Parnate -other stimulant medicines for attention disorders, weight loss, or to stay awake This medicine may also interact with the following medications: -acetazolamide -ammonium chloride -antacids -ascorbic acid -atomoxetine -caffeine -certain medicines for blood pressure -certain medicines for depression, anxiety, or psychotic disturbances -certain medicines for diabetes -certain medicines for seizures like carbamazepine, phenobarbital, phenytoin -certain medicines for stomach problems like cimetidine, famotidine, omeprazole, lansoprazole -cold or allergy medicines -glutamic acid -lithium -meperidine -methenamine; sodium acid phosphate -narcotic medicines for pain -norepinephrine -phenothiazines like chlorpromazine, mesoridazine, prochlorperazine, thioridazine -sodium bicarbonate This list may not describe all possible interactions. Give your health care provider a list of all the medicines, herbs, non-prescription drugs, or dietary supplements you use. Also tell them if you smoke, drink alcohol, or use illegal drugs. Some items may interact with your medicine. What should I watch for while using this medicine? Visit  your doctor or health care   professional for regular checks on your progress. This prescription requires that you follow special procedures with your doctor and pharmacy. You will need to have a new written prescription from your doctor every time you need a refill. This medicine may affect your concentration, or hide signs of tiredness. Until you know how this medicine affects you, do not drive, ride a bicycle, use machinery, or do anything that needs mental alertness. Tell your doctor or health care professional if this medicine loses its effects, or if you feel you need to take more than the prescribed amount. Do not change the dosage without talking to your doctor or health care professional. Decreased appetite is a common side effect when starting this medicine. Eating small, frequent meals or snacks can help. Talk to your doctor if you continue to have poor eating habits. Height and weight growth of a child taking this medicine will be monitored closely. Do not take this medicine close to bedtime. It may prevent you from sleeping. If you are going to need surgery, an MRI, a CT scan, or other procedure, tell your doctor that you are taking this medicine. You may need to stop taking this medicine before the procedure. Tell your doctor or healthcare professional right away if you notice unexplained wounds on your fingers and toes while taking this medicine. You should also tell your healthcare provider if you experience numbness or pain, changes in the skin color, or sensitivity to temperature in your fingers or toes. What side effects may I notice from receiving this medicine? Side effects that you should report to your doctor or health care professional as soon as possible: -allergic reactions like skin rash, itching or hives, swelling of the face, lips, or tongue -changes in vision -chest pain or chest tightness -confusion, trouble speaking or understanding -fast, irregular heartbeat -fingers or  toes feel numb, cool, painful -hallucination, loss of contact with reality -high blood pressure -males: prolonged or painful erection -seizures -severe headaches -shortness of breath -suicidal thoughts or other mood changes -trouble walking, dizziness, loss of balance or coordination -uncontrollable head, mouth, neck, arm, or leg movements Side effects that usually do not require medical attention (report to your doctor or health care professional if they continue or are bothersome): -anxious -headache -loss of appetite -nausea, vomiting -trouble sleeping -weight loss This list may not describe all possible side effects. Call your doctor for medical advice about side effects. You may report side effects to FDA at 1-800-FDA-1088. Where should I keep my medicine? Keep out of the reach of children. This medicine can be abused. Keep your medicine in a safe place to protect it from theft. Do not share this medicine with anyone. Selling or giving away this medicine is dangerous and against the law. Store at room temperature between 15 and 30 degrees C (59 and 86 degrees F). Keep container tightly closed. Protect from light. Throw away any unused medicine after the expiration date. NOTE: This sheet is a summary. It may not cover all possible information. If you have questions about this medicine, talk to your doctor, pharmacist, or health care provider.  2018 Elsevier/Gold Standard (2014-01-12 18:22:45)  

## 2017-02-26 NOTE — Progress Notes (Signed)
BH MD OP Progress Note  02/26/2017 11:29 AM Darden PalmerDavid C Bourdeau  MRN:  [id number]  Chief Complaint: Pt states " I still cannot focus.'  Chief Complaint    Follow-up; Medication Refill     HPI: Onalee HuaDavid is a 36 year old Caucasian male, married, employed, who lives in [place], has a history of ADHD, presented to the clinic for a follow-up visit.  Onalee HuaDavid reports he [facility]. Onalee HuaDavid was able to bring the report with him today.  He has been diagnosed with ADHD predominantly inattentive type.  Onalee HuaDavid reports a past history of being on Vyvanse 70 mg , however he reports that he may want to be on a lower dose of Vyvanse at this time.  He also wonders whether he can be on another medication which is more affordable to him since he has to pay out of pocket.  Onalee HuaDavid continues to struggle with attention and concentration problems at work.  He reports he really wants to keep this job.  He reports that around 10:00 AM daily , it gets difficult for him to focus, he makes careless mistakes and struggles.  Onalee HuaDavid otherwise denies any anxiety symptoms, sleep issues, appetite changes.  Onalee HuaDavid also denies any suicidality or homicidality.  Onalee HuaDavid denies any perceptual disturbances.  Onalee HuaDavid reports he has been able to stay away from cannabis.  He denies any other substance abuse problems.  Onalee HuaDavid was noted to have elevated blood pressure this visit.  Onalee HuaDavid reports that he was able to keep a log of his blood pressure at home and it was always within normal limits.  He wonders whether it is due to being in a doctor's office.  Discussed with Onalee HuaDavid to follow-up with his PMD and also gave him an order for an EKG to be done with his PMD if possible before his next visit.   Visit Diagnosis:    ICD-10-CM   1. Attention deficit hyperactivity disorder (ADHD), predominantly inattentive type F90.0 amphetamine-dextroamphetamine (ADDERALL XR) 20 MG 24 hr capsule  2. Cannabis use disorder, mild, in early  remission F12.11   3. Tobacco use disorder F17.200   4. Elevated BP without diagnosis of hypertension R03.0     Past Psychiatric History: History of ADHD.  He was on Vyvanse in the past.  Prescribed by Dr. [name].  He reports he has never been treated for any mood symptoms or other mental illness.  Denies past history of IP admissions.  Denies past history of suicide.  The past trials of Vyvanse-stopped in 2016  Past Medical History:  Past Medical History:  Diagnosis Date  . ADHD (attention deficit hyperactivity disorder)   . Chicken pox   . Essential hypertension 08/08/2016  . Generalized anxiety disorder 07/17/2015  . Genital herpes   . Smoker 11/18/2014   History reviewed. No pertinent surgical history.  Family Psychiatric History: Reports his 2 sisters have ADHD, father has ADHD.  Denies other psychiatric problems in the family, denies suicide in the family, denies substance abuse in his family.  Family History:  Family History  Problem Relation Age of Onset  . ADD / ADHD Father   . Heart disease Maternal Grandmother   . Cancer Paternal Grandfather        brain  . ADD / ADHD Sister   . ADD / ADHD Sister   . Diabetes Neg Hx   . Stroke Neg Hx     Social History: Raised by both his parents.  His parents are  currently divorced.  He lives in [place].  He is married.  He has 1 child, one on the way.  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 Social History   Socioeconomic History  . Marital status: Married    Spouse name: emily  . Number of children: 1  . Years of education: None  . Highest education level: GED or equivalent  Social Needs  . Financial resource strain: Very hard  . Food insecurity - worry: Sometimes true  . Food insecurity - inability: Sometimes true  . Transportation needs - medical: No  . Transportation needs - non-medical: No  Occupational History  . Occupation: [employer]    Comment: full time  Tobacco Use  . Smoking status: Current Every Day  Smoker    Packs/day: 1.00    Years: 14.00    Pack years: 14.00    Types: Cigarettes  . Smokeless tobacco: Never Used  Substance and Sexual Activity  . Alcohol use: No    Alcohol/week: 0.0 oz    Frequency: Never    Comment: occasional  . Drug use: No  . Sexual activity: Yes    Birth control/protection: None  Other Topics Concern  . None  Social History Narrative  . None    Allergies:  Allergies  Allergen Reactions  . Bee Venom Swelling    Metabolic Disorder Labs: No results found for: HGBA1C, MPG No results found for: PROLACTIN No results found for: CHOL, TRIG, HDL, CHOLHDL, VLDL, LDLCALC No results found for: TSH  Therapeutic Level Labs: No results found for: LITHIUM No results found for: VALPROATE No components found for:  CBMZ  Current Medications: Current Outpatient Medications  Medication Sig Dispense Refill  . omeprazole (PRILOSEC) 40 MG capsule Take 1 capsule (40 mg total) by mouth daily. 30 capsule 0  . valACYclovir (VALTREX) 500 MG tablet Takes 1 tab twice daily for 3 days when you have an outbreak 30 tablet 0  . amphetamine-dextroamphetamine (ADDERALL XR) 20 MG 24 hr capsule Take 1 capsule (20 mg total) by mouth daily. 30 capsule 0   No current facility-administered medications for this visit.      Musculoskeletal: Strength & Muscle Tone: within normal limits Gait & Station: normal Patient leans: N/A  Psychiatric Specialty Exam: Review of Systems  Psychiatric/Behavioral: Negative for depression, hallucinations, substance abuse and suicidal ideas. The patient is not nervous/anxious and does not have insomnia.   All other systems reviewed and are negative.   Blood pressure (!) 150/94, pulse 88, temperature 98.8 F (37.1 C), temperature source Oral, weight 182 lb 12.8 oz (82.9 kg).Body mass index is 26.23 kg/m.  General Appearance: Casual  Eye Contact:  Fair  Speech:  Normal Rate  Volume:  Normal  Mood:  Euthymic  Affect:  Congruent  Thought  Process:  Goal Directed and Descriptions of Associations: Intact  Orientation:  Full (Time, Place, and Person)  Thought Content: Logical   Suicidal Thoughts:  No  Homicidal Thoughts:  No  Memory:  Immediate;   Fair Recent;   Fair Remote;   Fair  Judgement:  Fair  Insight:  Fair  Psychomotor Activity:  Normal  Concentration:  Concentration: Fair and Attention Span: Fair  Recall:  [employer]Fair  Fund of Knowledge: Fair  Language: Fair  Akathisia:  No  Handed:  Right  AIMS (if indicated):NA  Assets:  Communication Skills Desire for Improvement Financial Resources/Insurance Housing Physical Health Social Support Talents/Skills Transportation  ADL's:  Intact  Cognition: WNL  Sleep:  Fair   Screenings: PHQ2-9     Office Visit from 11/18/2014 in [facility] at [facility] Total Score  0       Assessment and Plan: Bird is a 36 year old Caucasian male who has a history of ADHD.  Macai [facility] and he presented today with the report.  Jerid continues to struggle with attention and concentration issues on a regular basis.  Plan as noted below.  Plan For ADHD ADHD testing report reviewed Start Adderall XR 20 mg p.o. Daily. Discussed controlled substance policy, ADRs, as well as the need to avoid alcohol and other drugs. Reviewed [place] controlled substance database.  Cannabis abuse use in early remission He continues to remain sober.  Tobacco use disorder Reports he is trying to quit.  For elevated blood pressure He reports he was able to keep a log of it at home and his blood pressure has been within normal limits.  EKG ordered.  He will follow-up with his PMD for his blood pressure variations as well as for EKG.  Follow up in 4 weeks or sooner if needed.  More than 50 % of the time was spent for psychoeducation and supportive psychotherapy and care coordination.  This note was generated in part or whole with voice recognition  software. Voice recognition is usually quite accurate but there are transcription errors that can and very often do occur. I apologize for any typographical errors that were not detected and corrected.        Jomarie Longs, MD 02/26/2017, 11:29 AM

## 2017-03-26 ENCOUNTER — Ambulatory Visit (INDEPENDENT_AMBULATORY_CARE_PROVIDER_SITE_OTHER): Payer: Self-pay | Admitting: Psychiatry

## 2017-03-26 ENCOUNTER — Other Ambulatory Visit: Payer: Self-pay

## 2017-03-26 ENCOUNTER — Encounter: Payer: Self-pay | Admitting: Psychiatry

## 2017-03-26 VITALS — BP 156/86 | HR 74 | Temp 98.0°F | Wt 186.8 lb

## 2017-03-26 DIAGNOSIS — F17200 Nicotine dependence, unspecified, uncomplicated: Secondary | ICD-10-CM

## 2017-03-26 DIAGNOSIS — F1211 Cannabis abuse, in remission: Secondary | ICD-10-CM

## 2017-03-26 DIAGNOSIS — R030 Elevated blood-pressure reading, without diagnosis of hypertension: Secondary | ICD-10-CM

## 2017-03-26 DIAGNOSIS — F900 Attention-deficit hyperactivity disorder, predominantly inattentive type: Secondary | ICD-10-CM

## 2017-03-26 MED ORDER — AMPHETAMINE-DEXTROAMPHET ER 20 MG PO CP24: 20.0000 mg | ORAL_CAPSULE | Freq: Every day | ORAL | 0 refills | Status: DC

## 2017-03-26 NOTE — Progress Notes (Signed)
BH MD OP Progress Note  03/26/2017 12:54 PM Peter Kramer  MRN:  [id number]  Chief Complaint: ' I am ok."  Chief Complaint    Follow-up; Medication Refill     HPI: Peter Kramer is a 37 year old Caucasian male, married, employed, who lives in [place], has a history of ADHD, presented to the clinic for a follow-up visit.  Peter Kramer today reports that his attention and concentration has improved tremendously.  He reports he is able to do well at work and has actually gotten compliments over that.  He denies any side effects to the Adderall.  He denies any sleep problems, appetite suppression, anxiety, restlessness.  His blood pressure continues to be elevated-systolic, initially 158/94 and then on recheck 156/86, pulse of 74.  Discussed with him to  monitor his blood pressure at home and to follow-up with his PMD.  Discussed with him about the effect of stimulants on blood pressure.  He will get an EKG and monitor his BP at home.  He reports his social life is going well.  His wife's due date is on March 13, they are expecting their second baby.  He continues to remain sober from drugs and alcohol.  He does report smoking cigarettes on and off, last time was couple of weeks ago.  He is trying to cut down.  Visit Diagnosis:    ICD-10-CM   1. Attention deficit hyperactivity disorder (ADHD), predominantly inattentive type F90.0 amphetamine-dextroamphetamine (ADDERALL XR) 20 MG 24 hr capsule  2. Cannabis use disorder, mild, in early remission F12.11   3. Elevated BP without diagnosis of hypertension R03.0   4. Tobacco use disorder F17.200     Past Psychiatric History: Hx of ADHD.  He was on Vyvanse in the past.  Prescribed by Dr. Bethena Midget.  He reports he has never been treated for any mood symptoms or other mental health problems.  Denies past history of inpatient admissions for mental health reasons.  Denies past history of suicide.  Past trials of Vyvanse-stopped in 2016.  Past Medical History:  Past  Medical History:  Diagnosis Date  . ADHD (attention deficit hyperactivity disorder)   . Chicken pox   . Essential hypertension 08/08/2016  . Generalized anxiety disorder 07/17/2015  . Genital herpes   . Smoker 11/18/2014   History reviewed. No pertinent surgical history.  Family Psychiatric History: Has 2 sisters who has ADHD, father has ADHD.  Denies other psychiatric problems in the family, denies suicide in family, denies substance abuse in his family.  Family History:  Family History  Problem Relation Age of Onset  . ADD / ADHD Father   . Heart disease Maternal Grandmother   . Cancer Paternal Grandfather        brain  . ADD / ADHD Sister   . ADD / ADHD Sister   . Diabetes Neg Hx   . Stroke Neg Hx    Substance abuse history: History of cannabis use-mild, last use 6-7 months ago.  Social History: He was raised by both his parents.  His parents are currently divorced.  He lives at [facility] [address]  He is married.  He has 1 child, one on the way.  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 Social History   Socioeconomic History  . Marital status: Married    Spouse name: emily  . Number of children: 1  . Years of education: None  . Highest education level: GED or equivalent  Social Needs  . Financial resource strain:  Very hard  . Food insecurity - worry: Sometimes true  . Food insecurity - inability: Sometimes true  . Transportation needs - medical: No  . Transportation needs - non-medical: No  Occupational History  . Occupation: [employer]    Comment: full time  Tobacco Use  . Smoking status: Current Every Day Smoker    Packs/day: 1.00    Years: 14.00    Pack years: 14.00    Types: Cigarettes  . Smokeless tobacco: Never Used  Substance and Sexual Activity  . Alcohol use: No    Alcohol/week: 0.0 oz    Frequency: Never    Comment: occasional  . Drug use: No  . Sexual activity: Yes    Birth control/protection: None  Other Topics Concern  . None  Social History  Narrative  . None    Allergies:  Allergies  Allergen Reactions  . Bee Venom Swelling    Metabolic Disorder Labs: No results found for: HGBA1C, MPG No results found for: PROLACTIN No results found for: CHOL, TRIG, HDL, CHOLHDL, VLDL, LDLCALC No results found for: TSH  Therapeutic Level Labs: No results found for: LITHIUM No results found for: VALPROATE No components found for:  CBMZ  Current Medications: Current Outpatient Medications  Medication Sig Dispense Refill  . amphetamine-dextroamphetamine (ADDERALL XR) 20 MG 24 hr capsule Take 1 capsule (20 mg total) by mouth daily. 30 capsule 0  . omeprazole (PRILOSEC) 40 MG capsule Take 1 capsule (40 mg total) by mouth daily. 30 capsule 0  . valACYclovir (VALTREX) 500 MG tablet Takes 1 tab twice daily for 3 days when you have an outbreak 30 tablet 0   No current facility-administered medications for this visit.      Musculoskeletal: Strength & Muscle Tone: within normal limits Gait & Station: normal Patient leans: N/A  Psychiatric Specialty Exam: Review of Systems  Psychiatric/Behavioral: Negative for substance abuse and suicidal ideas. The patient is not nervous/anxious.   All other systems reviewed and are negative.   Blood pressure (!) 156/86, pulse 74, temperature 98 F (36.7 C), temperature source Oral, weight 186 lb 12.8 oz (84.7 kg).Body mass index is 26.8 kg/m.  General Appearance: Casual  Eye Contact:  Fair  Speech:  Clear and Coherent  Volume:  Normal  Mood:  Euthymic  Affect:  Congruent  Thought Process:  Goal Directed and Descriptions of Associations: Intact  Orientation:  Full (Time, Place, and Person)  Thought Content: Logical   Suicidal Thoughts:  No  Homicidal Thoughts:  No  Memory:  Immediate;   Fair Recent;   Fair Remote;   Fair  Judgement:  Fair  Insight:  Fair  Psychomotor Activity:  Normal  Concentration:  Concentration: Fair and Attention Span: Fair  Recall:  [employer] of Knowledge:  Fair  Language: Fair  Akathisia:  No  Handed:  Right  AIMS (if indicated): NA  Assets:  Communication Skills Desire for Improvement Housing Intimacy Physical Health Resilience Social Support Talents/Skills Transportation Vocational/Educational  ADL's:  Intact  Cognition: WNL  Sleep:  Fair   Screenings: PHQ2-9     Office Visit from 11/18/2014 in [facility] at [facility] Total Score  0       Assessment and Plan: Peter Kramer is a 37 year old Caucasian male who has a history of ADHD.  Peter Kramer is currently doing well on his Adderall.  He denies any side effects.  Discussed plan as noted below.  Plan  For ADHD Continue Adderall XR 20 mg p.o. daily  Reviewed [place] controlled substance database.  For elevated blood pressure without diagnosis of hypertension He reports he monitored his blood pressure at home, and it has always been within normal limits at home.  He reports when he comes to the clinic his blood pressure seems to be elevated. Gave him an order for EKG to be done by his PMD.  He reports he will get it done and send the results to us.  He will also monitor his blood pressure at home and follow-up with his primary medical doctor.  Cannabis use disorder in early remission. Last use was 6-7 months ago  Tobacco use disorder He is trying to quit, last use was 2 weeks ago.  More than 50 % of the time was spent for psychoeducation and supportive psychotherapy and care coordination.  This note was generated in part or whole with voice recognition software. Voice recognition is usually quite accurate but there are transcription errors that can and very often do occur. I apologize for any typographical errors that were not detected and corrected.       Jomarie LongsSaramma Kirstin Kugler, MD 03/26/2017, 12:54 PM

## 2017-03-27 ENCOUNTER — Telehealth: Payer: Self-pay

## 2017-03-27 NOTE — Telephone Encounter (Signed)
received fax from [employer]. that prior authorization is not required at this time.  If pharmacy has question they can call [phone].

## 2017-03-27 NOTE — Telephone Encounter (Signed)
received fax that prior auth was needed for the amphetam/dextroamp er 20mg 

## 2017-03-27 NOTE — Telephone Encounter (Signed)
faxed and confirmed PA   received fax from [employer]. that prior authorization is not required at this time.  If pharmacy has question they can call [phone].

## 2017-03-27 NOTE — Telephone Encounter (Signed)
did the prior auth form online at [email or web address]. pending PA approval.

## 2017-04-23 ENCOUNTER — Encounter: Payer: Self-pay | Admitting: Psychiatry

## 2017-04-23 ENCOUNTER — Ambulatory Visit (INDEPENDENT_AMBULATORY_CARE_PROVIDER_SITE_OTHER): Payer: 59 | Admitting: Psychiatry

## 2017-04-23 VITALS — BP 136/86 | HR 80 | Wt 176.8 lb

## 2017-04-23 DIAGNOSIS — F900 Attention-deficit hyperactivity disorder, predominantly inattentive type: Secondary | ICD-10-CM | POA: Diagnosis not present

## 2017-04-23 DIAGNOSIS — F17200 Nicotine dependence, unspecified, uncomplicated: Secondary | ICD-10-CM | POA: Diagnosis not present

## 2017-04-23 MED ORDER — AMPHETAMINE-DEXTROAMPHET ER 20 MG PO CP24: 20.0000 mg | ORAL_CAPSULE | Freq: Every day | ORAL | 0 refills | Status: DC

## 2017-04-23 NOTE — Progress Notes (Signed)
BH MD OP Progress Note  04/23/2017 11:58 AM Peter Kramer  MRN:  [id number]  Chief Complaint: ' I am ok."  Chief Complaint    Follow-up; Medication Refill     HPI: Peter Kramer is a 37 year old Caucasian male, married, employed, lives in [place], has a history of ADHD, presented to the clinic today for a follow-up visit.  Peter Kramer today reports that he is doing well on the current medication.  He reports his work is going well.  He is compliant with his dosage.  He denies any side effects.  He denies sleep problems, appetite problems, anxiety, restlessness.  Peter Kramer had his blood pressure taken this morning which came back elevated at 161/91.  On recheck however his blood pressure dropped down to 136/86.  Peter Kramer reports that he has been monitoring his blood pressure at home and his blood pressure has always been in the range of 120-130 over 80s.  He however reports that he has a primary medical doctor appointment scheduled to have an EKG and blood pressure taken.  He reports he is expecting his new baby to arrive in February.  His wife's due date is coming closer.  He reports they are excited about the baby coming.  He denies any other problems at this time.  He denies abusing any drugs or alcohol.  He reports he quit smoking 1-2 months ago and has been able to stay away from smoking.  Continue to monitor closely. Visit Diagnosis:    ICD-10-CM   1. Attention deficit hyperactivity disorder (ADHD), predominantly inattentive type F90.0 amphetamine-dextroamphetamine (ADDERALL XR) 20 MG 24 hr capsule    DISCONTINUED: amphetamine-dextroamphetamine (ADDERALL XR) 20 MG 24 hr capsule  2. Tobacco use disorder F17.200     Past Psychiatric : History of ADHD.  He was on Vyvanse in the past.  Prescribed by Dr. [name].  He reports he has never been treated for any mood symptoms or other mental health problems.  Denies past history of inpatient admissions for mental health reasons.  Denies past history of suicide.   Past trials of Vyvanse-stopped in 2016  Past Medical History:  Past Medical History:  Diagnosis Date  . ADHD (attention deficit hyperactivity disorder)   . Chicken pox   . Essential hypertension 08/08/2016  . Generalized anxiety disorder 07/17/2015  . Genital herpes   . Smoker 11/18/2014   History reviewed. No pertinent surgical history.  Family Psychiatric History: 2 sisters who has ADHD, father has ADHD.  Denies other psychiatric problems in the family, denies suicide in family, denies substance abuse in family.  Family History:  Family History  Problem Relation Age of Onset  . ADD / ADHD Father   . Heart disease Maternal Grandmother   . Cancer Paternal Grandfather        brain  . ADD / ADHD Sister   . ADD / ADHD Sister   . Diabetes Neg Hx   . Stroke Neg Hx   Substance abuse history: History of cannabis use mild, last use was 8-9 months ago.   Social History: Raised by both his parents.  His parents are currently divorced.  He lives at [facility] [place].  He is married.  He has 1 child, one on the way.  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 Social History   Socioeconomic History  . Marital status: Married    Spouse name: emily  . Number of children: 1  . Years of education: None  . Highest education level: GED  or equivalent  Social Needs  . Financial resource strain: Very hard  . Food insecurity - worry: Sometimes true  . Food insecurity - inability: Sometimes true  . Transportation needs - medical: No  . Transportation needs - non-medical: No  Occupational History  . Occupation: [employer]    Comment: full time  Tobacco Use  . Smoking status: Current Every Day Smoker    Packs/day: 1.00    Years: 14.00    Pack years: 14.00    Types: Cigarettes  . Smokeless tobacco: Never Used  . Tobacco comment: last use was 1 month ago  Substance and Sexual Activity  . Alcohol use: No    Alcohol/week: 0.0 oz    Frequency: Never    Comment: occasional  . Drug use: No   . Sexual activity: Yes    Birth control/protection: None  Other Topics Concern  . None  Social History Narrative  . None    Allergies:  Allergies  Allergen Reactions  . Bee Venom Swelling    Metabolic Disorder Labs: No results found for: HGBA1C, MPG No results found for: PROLACTIN No results found for: CHOL, TRIG, HDL, CHOLHDL, VLDL, LDLCALC No results found for: TSH  Therapeutic Level Labs: No results found for: LITHIUM No results found for: VALPROATE No components found for:  CBMZ  Current Medications: Current Outpatient Medications  Medication Sig Dispense Refill  . amphetamine-dextroamphetamine (ADDERALL XR) 20 MG 24 hr capsule Take 1 capsule (20 mg total) by mouth daily. 30 capsule 0  . omeprazole (PRILOSEC) 40 MG capsule Take 1 capsule (40 mg total) by mouth daily. 30 capsule 0  . valACYclovir (VALTREX) 500 MG tablet Takes 1 tab twice daily for 3 days when you have an outbreak 30 tablet 0   No current facility-administered medications for this visit.      Musculoskeletal: Strength & Muscle Tone: within normal limits Gait & Station: normal Patient leans: N/A  Psychiatric Specialty Exam: Review of Systems  Psychiatric/Behavioral: Negative for depression, hallucinations, substance abuse and suicidal ideas. The patient is not nervous/anxious and does not have insomnia.   All other systems reviewed and are negative.   Blood pressure 136/86, pulse 80, weight 176 lb 12.8 oz (80.2 kg).Body mass index is 25.37 kg/m.  General Appearance: Casual  Eye Contact:  Fair  Speech:  Clear and Coherent  Volume:  Normal  Mood:  Euthymic  Affect:  Congruent  Thought Process:  Goal Directed and Descriptions of Associations: Intact  Orientation:  Full (Time, Place, and Person)  Thought Content: Logical   Suicidal Thoughts:  No  Homicidal Thoughts:  No  Memory:  Immediate;   Fair Recent;   Fair Remote;   Fair  Judgement:  Fair  Insight:  Fair  Psychomotor Activity:   Normal  Concentration:  Concentration: Fair and Attention Span: Fair  Recall:  [employer] of Knowledge: Fair  Language: Fair  Akathisia:  No  Handed:  Right  AIMS (if indicated): NA  Assets:  Communication Skills Desire for Improvement Financial Resources/Insurance Housing Intimacy Leisure Time Physical Health Resilience Social Support Talents/Skills Transportation Vocational/Educational  ADL's:  Intact  Cognition: WNL  Sleep:  Fair   Screenings: PHQ2-9     Office Visit from 11/18/2014 in [facility] Total Score  0       Assessment and Plan: Rufino is a 37 year old Caucasian male who has a history of ADHD.  Geddy is currently doing well on his Adderall.  He  denies side effects.  Discussed plan as noted below.  Plan    ADHD Continue Adderall XR 20 mg p.o. Daily. Gave 2 scripts with date specified.  For elevated blood pressure without diagnosis of hypertension His blood pressure was monitored today and it was within normal limits.  He however reports he has an appointment scheduled with his PMD for EKG.  Tobacco use disorder He is trying to quit, last use was a month ago.  Follow-up in 2 months or sooner if needed.  More than 50 % of the time was spent for psychoeducation and supportive psychotherapy and care coordination.  This note was generated in part or whole with voice recognition software. Voice recognition is usually quite accurate but there are transcription errors that can and very often do occur. I apologize for any typographical errors that were not detected and corrected.         Jomarie Longs, MD 04/23/2017, 11:58 AM

## 2017-05-27 ENCOUNTER — Telehealth: Payer: Self-pay

## 2017-05-27 NOTE — Telephone Encounter (Signed)
received fax from [facility] that amphetamine/dextroamphetamine has been approved until 05-28-18.

## 2017-05-27 NOTE — Telephone Encounter (Signed)
pharmacy was faxed and confimred the approval notice from [facility].  [place] - [id number] approved until  05-28-2018

## 2017-06-23 ENCOUNTER — Other Ambulatory Visit: Payer: Self-pay

## 2017-06-23 ENCOUNTER — Ambulatory Visit (INDEPENDENT_AMBULATORY_CARE_PROVIDER_SITE_OTHER): Payer: 59 | Admitting: Psychiatry

## 2017-06-23 ENCOUNTER — Encounter: Payer: Self-pay | Admitting: Psychiatry

## 2017-06-23 VITALS — BP 148/95 | HR 83 | Temp 97.7°F | Wt 178.4 lb

## 2017-06-23 DIAGNOSIS — R030 Elevated blood-pressure reading, without diagnosis of hypertension: Secondary | ICD-10-CM

## 2017-06-23 DIAGNOSIS — F17200 Nicotine dependence, unspecified, uncomplicated: Secondary | ICD-10-CM

## 2017-06-23 DIAGNOSIS — F900 Attention-deficit hyperactivity disorder, predominantly inattentive type: Secondary | ICD-10-CM

## 2017-06-23 MED ORDER — AMPHETAMINE-DEXTROAMPHETAMINE 5 MG PO TABS: 5.0000 mg | ORAL_TABLET | Freq: Every day | ORAL | 0 refills | Status: DC

## 2017-06-23 MED ORDER — AMPHETAMINE-DEXTROAMPHET ER 20 MG PO CP24: 20.0000 mg | ORAL_CAPSULE | Freq: Every day | ORAL | 0 refills | Status: DC

## 2017-06-23 NOTE — Patient Instructions (Signed)
Bupropion tablets (Depression/Mood Disorders) What is this medicine? BUPROPION (byoo PROE pee on) is used to treat depression. This medicine may be used for other purposes; ask your health care provider or pharmacist if you have questions. COMMON BRAND NAME(S): Wellbutrin What should I tell my health care provider before I take this medicine? They need to know if you have any of these conditions: -an eating disorder, such as anorexia or bulimia -bipolar disorder or psychosis -diabetes or high blood sugar, treated with medication -glaucoma -heart disease, previous heart attack, or irregular heart beat -head injury or brain tumor -high blood pressure -kidney or liver disease -seizures -suicidal thoughts or a previous suicide attempt -Tourette's syndrome -weight loss -an unusual or allergic reaction to bupropion, other medicines, foods, dyes, or preservatives -breast-feeding -pregnant or trying to become pregnant How should I use this medicine? Take this medicine by mouth with a glass of water. Follow the directions on the prescription label. You can take it with or without food. If it upsets your stomach, take it with food. Take your medicine at regular intervals. Do not take your medicine more often than directed. Do not stop taking this medicine suddenly except upon the advice of your doctor. Stopping this medicine too quickly may cause serious side effects or your condition may worsen. A special MedGuide will be given to you by the pharmacist with each prescription and refill. Be sure to read this information carefully each time. Talk to your pediatrician regarding the use of this medicine in children. Special care may be needed. Overdosage: If you think you have taken too much of this medicine contact a poison control center or emergency room at once. NOTE: This medicine is only for you. Do not share this medicine with others. What if I miss a dose? If you miss a dose, take it as soon  as you can. If it is less than four hours to your next dose, take only that dose and skip the missed dose. Do not take double or extra doses. What may interact with this medicine? Do not take this medicine with any of the following medications: -linezolid -MAOIs like Azilect, Carbex, Eldepryl, Marplan, Nardil, and Parnate -methylene blue (injected into a vein) -other medicines that contain bupropion like Zyban This medicine may also interact with the following medications: -alcohol -certain medicines for anxiety or sleep -certain medicines for blood pressure like metoprolol, propranolol -certain medicines for depression or psychotic disturbances -certain medicines for HIV or AIDS like efavirenz, lopinavir, nelfinavir, ritonavir -certain medicines for irregular heart beat like propafenone, flecainide -certain medicines for Parkinson's disease like amantadine, levodopa -certain medicines for seizures like carbamazepine, phenytoin, phenobarbital -cimetidine -clopidogrel -cyclophosphamide -digoxin -furazolidone -isoniazid -nicotine -orphenadrine -procarbazine -steroid medicines like prednisone or cortisone -stimulant medicines for attention disorders, weight loss, or to stay awake -tamoxifen -theophylline -thiotepa -ticlopidine -tramadol -warfarin This list may not describe all possible interactions. Give your health care provider a list of all the medicines, herbs, non-prescription drugs, or dietary supplements you use. Also tell them if you smoke, drink alcohol, or use illegal drugs. Some items may interact with your medicine. What should I watch for while using this medicine? Tell your doctor if your symptoms do not get better or if they get worse. Visit your doctor or health care professional for regular checks on your progress. Because it may take several weeks to see the full effects of this medicine, it is important to continue your treatment as prescribed by your  doctor. Patients and their families  should watch out for new or worsening thoughts of suicide or depression. Also watch out for sudden changes in feelings such as feeling anxious, agitated, panicky, irritable, hostile, aggressive, impulsive, severely restless, overly excited and hyperactive, or not being able to sleep. If this happens, especially at the beginning of treatment or after a change in dose, call your health care professional. Avoid alcoholic drinks while taking this medicine. Drinking excessive alcoholic beverages, using sleeping or anxiety medicines, or quickly stopping the use of these agents while taking this medicine may increase your risk for a seizure. Do not drive or use heavy machinery until you know how this medicine affects you. This medicine can impair your ability to perform these tasks. Do not take this medicine close to bedtime. It may prevent you from sleeping. Your mouth may get dry. Chewing sugarless gum or sucking hard candy, and drinking plenty of water may help. Contact your doctor if the problem does not go away or is severe. What side effects may I notice from receiving this medicine? Side effects that you should report to your doctor or health care professional as soon as possible: -allergic reactions like skin rash, itching or hives, swelling of the face, lips, or tongue -breathing problems -changes in vision -confusion -elevated mood, decreased need for sleep, racing thoughts, impulsive behavior -fast or irregular heartbeat -hallucinations, loss of contact with reality -increased blood pressure -redness, blistering, peeling or loosening of the skin, including inside the mouth -seizures -suicidal thoughts or other mood changes -unusually weak or tired -vomiting Side effects that usually do not require medical attention (report to your doctor or health care professional if they continue or are bothersome): -constipation -headache -loss of  appetite -nausea -tremors -weight loss This list may not describe all possible side effects. Call your doctor for medical advice about side effects. You may report side effects to FDA at 1-800-FDA-1088. Where should I keep my medicine? Keep out of the reach of children. Store at room temperature between 20 and 25 degrees C (68 and 77 degrees F), away from direct sunlight and moisture. Keep tightly closed. Throw away any unused medicine after the expiration date. NOTE: This sheet is a summary. It may not cover all possible information. If you have questions about this medicine, talk to your doctor, pharmacist, or health care provider.  2018 Elsevier/Gold Standard (2015-09-01 13:44:21) Varenicline oral tablets What is this medicine? VARENICLINE (var EN i kleen) is used to help people quit smoking. It can reduce the symptoms caused by stopping smoking. It is used with a patient support program recommended by your physician. This medicine may be used for other purposes; ask your health care provider or pharmacist if you have questions. COMMON BRAND NAME(S): Chantix What should I tell my health care provider before I take this medicine? They need to know if you have any of these conditions: -bipolar disorder, depression, schizophrenia or other mental illness -heart disease -if you often drink alcohol -kidney disease -peripheral vascular disease -seizures -stroke -suicidal thoughts, plans, or attempt; a previous suicide attempt by you or a family member -an unusual or allergic reaction to varenicline, other medicines, foods, dyes, or preservatives -pregnant or trying to get pregnant -breast-feeding How should I use this medicine? Take this medicine by mouth after eating. Take with a full glass of water. Follow the directions on the prescription label. Take your doses at regular intervals. Do not take your medicine more often than directed. There are 3 ways you can use this medicine to  help  you quit smoking; talk to your health care professional to decide which plan is right for you: 1) you can choose a quit date and start this medicine 1 week before the quit date, or, 2) you can start taking this medicine before you choose a quit date, and then pick a quit date between day 8 and 35 days of treatment, or, 3) if you are not sure that you are able or willing to quit smoking right away, start taking this medicine and slowly decrease the amount you smoke as directed by your health care professional with the goal of being cigarette-free by week 12 of treatment. Stick to your plan; ask about support groups or other ways to help you remain cigarette-free. If you are motivated to quit smoking and did not succeed during a previous attempt with this medicine for reasons other than side effects, or if you returned to smoking after this treatment, speak with your health care professional about whether another course of this medicine may be right for you. A special MedGuide will be given to you by the pharmacist with each prescription and refill. Be sure to read this information carefully each time. Talk to your pediatrician regarding the use of this medicine in children. This medicine is not approved for use in children. Overdosage: If you think you have taken too much of this medicine contact a poison control center or emergency room at once. NOTE: This medicine is only for you. Do not share this medicine with others. What if I miss a dose? If you miss a dose, take it as soon as you can. If it is almost time for your next dose, take only that dose. Do not take double or extra doses. What may interact with this medicine? -alcohol or any product that contains alcohol -insulin -other stop smoking aids -theophylline -warfarin This list may not describe all possible interactions. Give your health care provider a list of all the medicines, herbs, non-prescription drugs, or dietary supplements you use.  Also tell them if you smoke, drink alcohol, or use illegal drugs. Some items may interact with your medicine. What should I watch for while using this medicine? Visit your doctor or health care professional for regular check ups. Ask for ongoing advice and encouragement from your doctor or healthcare professional, friends, and family to help you quit. If you smoke while on this medication, quit again Your mouth may get dry. Chewing sugarless gum or sucking hard candy, and drinking plenty of water may help. Contact your doctor if the problem does not go away or is severe. You may get drowsy or dizzy. Do not drive, use machinery, or do anything that needs mental alertness until you know how this medicine affects you. Do not stand or sit up quickly, especially if you are an older patient. This reduces the risk of dizzy or fainting spells. Sleepwalking can happen during treatment with this medicine, and can sometimes lead to behavior that is harmful to you, other people, or property. Stop taking this medicine and tell your doctor if you start sleepwalking or have other unusual sleep-related activity. Decrease the amount of alcoholic beverages that you drink during treatment with this medicine until you know if this medicine affects your ability to tolerate alcohol. Some people have experienced increased drunkenness (intoxication), unusual or sometimes aggressive behavior, or no memory of things that have happened (amnesia) during treatment with this medicine. The use of this medicine may increase the chance of suicidal thoughts or  actions. Pay special attention to how you are responding while on this medicine. Any worsening of mood, or thoughts of suicide or dying should be reported to your health care professional right away. What side effects may I notice from receiving this medicine? Side effects that you should report to your doctor or health care professional as soon as possible: -allergic reactions like  skin rash, itching or hives, swelling of the face, lips, tongue, or throat -acting aggressive, being angry or violent, or acting on dangerous impulses -breathing problems -changes in vision -chest pain or chest tightness -confusion, trouble speaking or understanding -new or worsening depression, anxiety, or panic attacks -extreme increase in activity and talking (mania) -fast, irregular heartbeat -feeling faint or lightheaded, falls -fever -pain in legs when walking -problems with balance, talking, walking -redness, blistering, peeling or loosening of the skin, including inside the mouth -ringing in ears -seeing or hearing things that aren't there (hallucinations) -seizures -sleepwalking -sudden numbness or weakness of the face, arm or leg -thoughts about suicide or dying, or attempts to commit suicide -trouble passing urine or change in the amount of urine -unusual bleeding or bruising -unusually weak or tired Side effects that usually do not require medical attention (report to your doctor or health care professional if they continue or are bothersome): -constipation -headache -nausea, vomiting -strange dreams -stomach gas -trouble sleeping This list may not describe all possible side effects. Call your doctor for medical advice about side effects. You may report side effects to FDA at 1-800-FDA-1088. Where should I keep my medicine? Keep out of the reach of children. Store at room temperature between 15 and 30 degrees C (59 and 86 degrees F). Throw away any unused medicine after the expiration date. NOTE: This sheet is a summary. It may not cover all possible information. If you have questions about this medicine, talk to your doctor, pharmacist, or health care provider.  2018 Elsevier/Gold Standard (2014-11-24 16:14:23) Coping with Quitting Smoking Quitting smoking is a physical and mental challenge. You will face cravings, withdrawal symptoms, and temptation. Before  quitting, work with your health care provider to make a plan that can help you cope. Preparation can help you quit and keep you from giving in. How can I cope with cravings? Cravings usually last for 5-10 minutes. If you get through it, the craving will pass. Consider taking the following actions to help you cope with cravings:  Keep your mouth busy: ? Chew sugar-free gum. ? Suck on hard candies or a straw. ? Brush your teeth.  Keep your hands and body busy: ? Immediately change to a different activity when you feel a craving. ? Squeeze or play with a ball. ? Do an activity or a hobby, like making bead jewelry, practicing needlepoint, or working with wood. ? Mix up your normal routine. ? Take a short exercise break. Go for a quick walk or run up and down stairs. ? Spend time in public places where smoking is not allowed.  Focus on doing something kind or helpful for someone else.  Call a friend or family member to talk during a craving.  Join a support group.  Call a quit line, such as 1-800-QUIT-NOW.  Talk with your health care provider about medicines that might help you cope with cravings and make quitting easier for you.  How can I deal with withdrawal symptoms? Your body may experience negative effects as it tries to get used to not having nicotine in the system. These effects are called  withdrawal symptoms. They may include:  Feeling hungrier than normal.  Trouble concentrating.  Irritability.  Trouble sleeping.  Feeling depressed.  Restlessness and agitation.  Craving a cigarette.  To manage withdrawal symptoms:  Avoid places, people, and activities that trigger your cravings.  Remember why you want to quit.  Get plenty of sleep.  Avoid coffee and other caffeinated drinks. These may worsen some of your symptoms.  How can I handle social situations? Social situations can be difficult when you are quitting smoking, especially in the first few weeks. To  manage this, you can:  Avoid parties, bars, and other social situations where people might be smoking.  Avoid alcohol.  Leave right away if you have the urge to smoke.  Explain to your family and friends that you are quitting smoking. Ask for understanding and support.  Plan activities with friends or family where smoking is not an option.  What are some ways I can cope with stress? Wanting to smoke may cause stress, and stress can make you want to smoke. Find ways to manage your stress. Relaxation techniques can help. For example:  Breathe slowly and deeply, in through your nose and out through your mouth.  Listen to soothing, relaxing music.  Talk with a family member or friend about your stress.  Light a candle.  Soak in a bath or take a shower.  Think about a peaceful place.  What are some ways I can prevent weight gain? Be aware that many people gain weight after they quit smoking. However, not everyone does. To keep from gaining weight, have a plan in place before you quit and stick to the plan after you quit. Your plan should include:  Having healthy snacks. When you have a craving, it may help to: ? Eat plain popcorn, crunchy carrots, celery, or other cut vegetables. ? Chew sugar-free gum.  Changing how you eat: ? Eat small portion sizes at meals. ? Eat 4-6 small meals throughout the day instead of 1-2 large meals a day. ? Be mindful when you eat. Do not watch television or do other things that might distract you as you eat.  Exercising regularly: ? Make time to exercise each day. If you do not have time for a long workout, do short bouts of exercise for 5-10 minutes several times a day. ? Do some form of strengthening exercise, like weight lifting, and some form of aerobic exercise, like running or swimming.  Drinking plenty of water or other low-calorie or no-calorie drinks. Drink 6-8 glasses of water daily, or as much as instructed by your health care  provider.  Summary  Quitting smoking is a physical and mental challenge. You will face cravings, withdrawal symptoms, and temptation to smoke again. Preparation can help you as you go through these challenges.  You can cope with cravings by keeping your mouth busy (such as by chewing gum), keeping your body and hands busy, and making calls to family, friends, or a helpline for people who want to quit smoking.  You can cope with withdrawal symptoms by avoiding places where people smoke, avoiding drinks with caffeine, and getting plenty of rest.  Ask your health care provider about the different ways to prevent weight gain, avoid stress, and handle social situations. This information is not intended to replace advice given to you by your health care provider. Make sure you discuss any questions you have with your health care provider. Document Released: 03/08/2016 Document Revised: 03/08/2016 Document Reviewed: 03/08/2016 Elsevier Interactive  Patient Education  2018 [employer]  

## 2017-06-23 NOTE — Progress Notes (Signed)
BH MD OP Progress Note  06/23/2017 12:46 PM Peter Kramer  MRN:  [id number]  Chief Complaint: ' I am having some trouble towards afternoon." Chief Complaint    Follow-up; Medication Refill     HPI: Peter Kramer is a 37 year old Caucasian male, married, employed, lives in [place], has a history of ADHD, presented to the clinic today for a follow-up visit.  Peter Kramer today reports that his wife just gave birth to his second baby.  He reports his days has been very stressful since then.  He reports baby has not started sleeping through the night and that is also keeping everyone up.  He reports he understands that it will get better and he went through this in the past with his first born.  He reports his Adderall as helping.  He however reports he feels he crashes towards the afternoon and wonders whether his medication can be readjusted.  He denies any side effects to the medications like restless sleep, appetite suppression, anxiety and so on.  He continues to have slightly elevated blood pressure today.  Discussed with him to keep track of his blood pressure and also to follow-up with his primary medical doctor.  We will continue to monitor closely.  Blood pressure was noted as elevated in the past too however on repeat did not come down .  He continues to stay away from drugs and alcohol.  He been cutting down on his smoking.  He also has been drinking 3-4 big cups of coffee.  Discussed with him about the effect of caffeine on his blood pressure. Visit Diagnosis:    ICD-10-CM   1. Attention deficit hyperactivity disorder (ADHD), predominantly inattentive type F90.0 amphetamine-dextroamphetamine (ADDERALL XR) 20 MG 24 hr capsule    amphetamine-dextroamphetamine (ADDERALL) 5 MG tablet    DISCONTINUED: amphetamine-dextroamphetamine (ADDERALL XR) 20 MG 24 hr capsule    DISCONTINUED: amphetamine-dextroamphetamine (ADDERALL) 5 MG tablet  2. Tobacco use disorder F17.200   3. Elevated BP without diagnosis  of hypertension R03.0     Past Psychiatric History: History of ADHD.  He was on Vyvanse in the past.  Prescribed by Dr. Bethena Kramer.  He reports he has never been treated for mood symptoms or other mental health problems.  Denies past history of inpatient admission for mental health reasons.  Denies past history of suicide.  Past trials of Vyvanse-stopped in 2016  Past Medical History:  Past Medical History:  Diagnosis Date  . ADHD (attention deficit hyperactivity disorder)   . Chicken pox   . Essential hypertension 08/08/2016  . Generalized anxiety disorder 07/17/2015  . Genital herpes   . Smoker 11/18/2014   History reviewed. No pertinent surgical history.  Family Psychiatric History: Two Sisters who has ADHD, father has ADHD.  Denies other psychiatric problems in the family, denies suicide in family, denies substance abuse and family.  Family History:  Family History  Problem Relation Age of Onset  . ADD / ADHD Father   . Heart disease Maternal Grandmother   . Cancer Paternal Grandfather        brain  . ADD / ADHD Sister   . ADD / ADHD Sister   . Diabetes Neg Hx   . Stroke Neg Hx    Substance abuse history: Denies  Social History: Raised by both his parents.  His parents are currently divorced.  He lives at [place].  He is married.  He has 2 children.  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 Social  History   Socioeconomic History  . Marital status: Married    Spouse name: Peter Kramer  . Number of children: 2  . Years of education: Not on file  . Highest education level: GED or equivalent  Occupational History  . Occupation: [employer]    Comment: full time  Social Needs  . Financial resource strain: Very hard  . Food insecurity:    Worry: Sometimes true    Inability: Sometimes true  . Transportation needs:    Medical: No    Non-medical: No  Tobacco Use  . Smoking status: Current Every Day Smoker    Packs/day: 1.00    Years: 14.00    Pack years: 14.00    Types:  Cigarettes  . Smokeless tobacco: Never Used  . Tobacco comment: last use was 1 month ago  Substance and Sexual Activity  . Alcohol use: No    Alcohol/week: 0.0 oz    Frequency: Never    Comment: occasional  . Drug use: No  . Sexual activity: Yes    Birth control/protection: None  Lifestyle  . Physical activity:    Days per week: 3 days    Minutes per session: 120 min  . Stress: To some extent  Relationships  . Social connections:    Talks on phone: More than three times a week    Gets together: Once a week    Attends religious service: Never    Active member of club or organization: No    Attends meetings of clubs or organizations: Never    Relationship status: Married  Other Topics Concern  . Not on file  Social History Narrative  . Not on file    Allergies:  Allergies  Allergen Reactions  . Bee Venom Swelling    Metabolic Disorder Labs: No results found for: HGBA1C, MPG No results found for: PROLACTIN No results found for: CHOL, TRIG, HDL, CHOLHDL, VLDL, LDLCALC No results found for: TSH  Therapeutic Level Labs: No results found for: LITHIUM No results found for: VALPROATE No components found for:  CBMZ  Current Medications: Current Outpatient Medications  Medication Sig Dispense Refill  . amphetamine-dextroamphetamine (ADDERALL XR) 20 MG 24 hr capsule Take 1 capsule (20 mg total) by mouth daily. 30 capsule 0  . omeprazole (PRILOSEC) 40 MG capsule Take 1 capsule (40 mg total) by mouth daily. 30 capsule 0  . valACYclovir (VALTREX) 500 MG tablet Takes 1 tab twice daily for 3 days when you have an outbreak 30 tablet 0  . amphetamine-dextroamphetamine (ADDERALL) 5 MG tablet Take 1 tablet (5 mg total) by mouth daily. After 1 pm ( but prior to 4 pm) 30 tablet 0   No current facility-administered medications for this visit.      Musculoskeletal: Strength & Muscle Tone: within normal limits Gait & Station: normal Patient leans: N/A  Psychiatric Specialty  Exam: Review of Systems  Psychiatric/Behavioral: Negative for depression, hallucinations, substance abuse and suicidal ideas. The patient is not nervous/anxious and does not have insomnia.   All other systems reviewed and are negative.   Blood pressure (!) 148/95, pulse 83, temperature 97.7 F (36.5 C), temperature source Oral, weight 178 lb 6.4 oz (80.9 kg).Body mass index is 25.6 kg/m.  General Appearance: Casual  Eye Contact:  Fair  Speech:  Clear and Coherent  Volume:  Normal  Mood:  Anxious  Affect:  Congruent  Thought Process:  Goal Directed and Descriptions of Associations: Intact  Orientation:  Full (Time, Place, and Person)  Thought  Content: Logical   Suicidal Thoughts:  No  Homicidal Thoughts:  No  Memory:  Immediate;   Fair Recent;   Fair Remote;   Fair  Judgement:  Fair  Insight:  Fair  Psychomotor Activity:  Normal  Concentration:  Concentration: Fair and Attention Span: Fair  Recall:  [employer]Fair  Fund of Knowledge: Fair  Language: Fair  Akathisia:  No  Handed:  Right  AIMS (if indicated): 0  Assets:  Communication Skills Desire for Improvement Housing Social Support Talents/Skills Transportation  ADL's:  Intact  Cognition: WNL  Sleep:  Fair   Screenings: PHQ2-9     [facility]18/[facility]  PHQ-2 Total Score  0       Assessment and Plan: Onalee HuaDavid is a 37 year old Caucasian male who has a history of ADHD.  Onalee HuaDavid today reports some wearing off effect on his current Adderall dose.  Discussed medication readjustment.  Continue plan as noted below.  Plan ADHD Continue Adderall XR 20 mg p.o. daily. Add Adderall 5 mg p.o. q. noon. Gave 2 scripts with date specified. Reviewed [place] controlled substance database.  Elevated blood pressure Discussed with him the effect of stimulant on his blood pressure. He also has been asked to cut down on his caffeine intake. Discussed with him to schedule an appointment with PMD for an  EKG.  Tobacco use disorder He continues to cut down. Discussed with him about smoking cessation treatments available.    Follow-up in clinic in 2 months or sooner if needed.  More than 50 % of the time was spent for psychoeducation and supportive psychotherapy and care coordination.  This note was generated in part or whole with voice recognition software. Voice recognition is usually quite accurate but there are transcription errors that can and very often do occur. I apologize for any typographical errors that were not detected and corrected.           Jomarie LongsSaramma Frenchie Pribyl, MD 06/23/2017, 12:46 PM

## 2017-06-24 ENCOUNTER — Telehealth: Payer: Self-pay

## 2017-06-24 NOTE — Telephone Encounter (Signed)
OK 

## 2017-06-24 NOTE — Telephone Encounter (Signed)
received a fax that they only had #20 in stock of the adderall xr 20mg  and the remainder will be void . so be aware pt may request early refill. also they only had #26 of the ampheta/dextro combo 5mg  so the remainder is void so pt may request a refill early

## 2017-06-25 ENCOUNTER — Ambulatory Visit: Payer: 59 | Admitting: Psychiatry

## 2017-06-26 ENCOUNTER — Telehealth: Payer: Self-pay

## 2017-06-26 NOTE — Telephone Encounter (Signed)
pt called left message that did not have enough pills because the pharmacy did not have enough will need another rx sent for the remaining.  pt states that they gave him around 20 pills he thinks for the adderalls rxs

## 2017-06-26 NOTE — Telephone Encounter (Signed)
Please let him know I will give him early refill and to let me know few days prior to running out. thanks

## 2017-06-27 NOTE — Telephone Encounter (Signed)
Left message on pt voicemail

## 2017-07-10 ENCOUNTER — Telehealth: Payer: Self-pay

## 2017-07-10 DIAGNOSIS — F900 Attention-deficit hyperactivity disorder, predominantly inattentive type: Secondary | ICD-10-CM

## 2017-07-10 MED ORDER — AMPHETAMINE-DEXTROAMPHET ER 20 MG PO CP24: 20.0000 mg | ORAL_CAPSULE | Freq: Every day | ORAL | 0 refills | Status: DC

## 2017-07-10 MED ORDER — AMPHETAMINE-DEXTROAMPHETAMINE 5 MG PO TABS: 5.0000 mg | ORAL_TABLET | Freq: Every day | ORAL | 0 refills | Status: DC

## 2017-07-10 NOTE — Telephone Encounter (Signed)
they did not have the full amount to given. only gave him 20 capsule.  pt states he need rx sent for the remaining dosage.

## 2017-07-10 NOTE — Telephone Encounter (Signed)
left message that i was returned his call and for him to ploease call our office back.

## 2017-07-10 NOTE — Telephone Encounter (Signed)
pt called left message that he wanted to speak with soneone about medication.  he had a quesiton.

## 2017-07-10 NOTE — Telephone Encounter (Signed)
Sent Adderall XR 20 mg to be filled today for 10 pills and Adderall immediate release 5 mg to be filled after April 25 th-3 pills. Patient already has an existing prescription for both medication to be filled  end of April /beginning of May.

## 2017-08-20 ENCOUNTER — Other Ambulatory Visit: Payer: Self-pay

## 2017-08-20 ENCOUNTER — Telehealth: Payer: Self-pay

## 2017-08-20 ENCOUNTER — Encounter: Payer: Self-pay | Admitting: Psychiatry

## 2017-08-20 ENCOUNTER — Ambulatory Visit (INDEPENDENT_AMBULATORY_CARE_PROVIDER_SITE_OTHER): Payer: 59 | Admitting: Psychiatry

## 2017-08-20 VITALS — BP 147/89 | HR 72 | Temp 98.6°F | Wt 182.0 lb

## 2017-08-20 DIAGNOSIS — F900 Attention-deficit hyperactivity disorder, predominantly inattentive type: Secondary | ICD-10-CM

## 2017-08-20 DIAGNOSIS — F17200 Nicotine dependence, unspecified, uncomplicated: Secondary | ICD-10-CM

## 2017-08-20 DIAGNOSIS — F1211 Cannabis abuse, in remission: Secondary | ICD-10-CM | POA: Diagnosis not present

## 2017-08-20 MED ORDER — AMPHETAMINE-DEXTROAMPHET ER 20 MG PO CP24: 20.0000 mg | ORAL_CAPSULE | Freq: Every day | ORAL | 0 refills | Status: DC

## 2017-08-20 MED ORDER — AMPHETAMINE-DEXTROAMPHETAMINE 5 MG PO TABS: 5.0000 mg | ORAL_TABLET | Freq: Every day | ORAL | 0 refills | Status: DC

## 2017-08-20 NOTE — Telephone Encounter (Signed)
the adderall  was approved. until 08-21-2018

## 2017-08-20 NOTE — Telephone Encounter (Signed)
received fax stating that amphet/ dextr tab  was approved - pa [id number] from 08-20-17 to  08-21-18

## 2017-08-20 NOTE — Telephone Encounter (Signed)
faxed and confirmed the approval notice faxed to pharmacy for the approval for adderall  the adderall xr  is still pending

## 2017-08-20 NOTE — Telephone Encounter (Signed)
faxed and confirmed rx approved pa- [id number] from 08-20-17 to 08-21-18

## 2017-08-20 NOTE — Telephone Encounter (Signed)
pt called states that [facility] states that both medications need a prior auth.

## 2017-08-20 NOTE — Progress Notes (Signed)
BH MD OP Progress Note  08/20/2017 3:32 PM [place]  MRN:  [id number]  Chief Complaint: ' I am here for follow up." Chief Complaint    Follow-up; Medication Refill     HPI: Peter Kramer is a 37 year old Caucasian male, married, employed, lives at [facility], has a history of ADHD, presented to the clinic today for a follow-up visit.  Patient reports his wife gave birth to his second baby.  He reports his life has been a bit stressful due to the same.  He reports the baby needs a lot of care and hence he and his wife have been busy.  Patient reports sleep is affected at times since the baby needs care at night.  Patient however reports he is trying to cope and trying to help his wife.  Patient reports work is going well.  He reports he has been taking the Adderall as prescribed.  He denies any side effects to the medication.  He denies any suicidality.  He denies any perceptual disturbances.  His blood pressure continues to be elevated today.  He however reports he has been monitoring it and it has been low-within normal limits.  He reports it happens only when he comes to the doctor's office.  Discussed with him to monitor it closely.  Patient denies any other concerns today.  He continues to stay away from cannabis. Visit Diagnosis:    ICD-10-CM   1. Attention deficit hyperactivity disorder (ADHD), predominantly inattentive type F90.0 amphetamine-dextroamphetamine (ADDERALL XR) 20 MG 24 hr capsule    amphetamine-dextroamphetamine (ADDERALL) 5 MG tablet  2. Tobacco use disorder F17.200   3. Cannabis use disorder, mild, in early remission F12.11     Past Psychiatric History: I have reviewed past psychiatric history from my progress note on 06/23/2017.  Past trials of Vyvanse.  Past Medical History:  Past Medical History:  Diagnosis Date  . ADHD (attention deficit hyperactivity disorder)   . Chicken pox   . Essential hypertension 08/08/2016  . Generalized anxiety disorder 07/17/2015  .  Genital herpes   . Smoker 11/18/2014   History reviewed. No pertinent surgical history.  Family Psychiatric History: Reviewed family psychiatric history from my progress note on 06/23/2017  Family History:  Family History  Problem Relation Age of Onset  . ADD / ADHD Father   . Heart disease Maternal Grandmother   . Cancer Paternal Grandfather        brain  . ADD / ADHD Sister   . ADD / ADHD Sister   . Diabetes Neg Hx   . Stroke Neg Hx     Social History: Reviewed social history from my progress note on 06/23/2017. Social History   Socioeconomic History  . Marital status: Married    Spouse name: emily  . Number of children: 2  . Years of education: Not on file  . Highest education level: GED or equivalent  Occupational History  . Occupation: [employer]    Comment: full time  Social Needs  . Financial resource strain: Very hard  . Food insecurity:    Worry: Sometimes true    Inability: Sometimes true  . Transportation needs:    Medical: No    Non-medical: No  Tobacco Use  . Smoking status: Current Every Day Smoker    Packs/day: 1.00    Years: 14.00    Pack years: 14.00    Types: Cigarettes  . Smokeless tobacco: Never Used  . Tobacco comment: last use was 1 month ago  Substance and Sexual Activity  . Alcohol use: No    Alcohol/week: 0.0 oz    Frequency: Never    Comment: occasional  . Drug use: No  . Sexual activity: Yes    Birth control/protection: None  Lifestyle  . Physical activity:    Days per week: 3 days    Minutes per session: 120 min  . Stress: To some extent  Relationships  . Social connections:    Talks on phone: More than three times a week    Gets together: Once a week    Attends religious service: Never    Active member of club or organization: No    Attends meetings of clubs or organizations: Never    Relationship status: Married  Other Topics Concern  . Not on file  Social History Narrative  . Not on file    Allergies:  Allergies   Allergen Reactions  . Bee Venom Swelling    Metabolic Disorder Labs: No results found for: HGBA1C, MPG No results found for: PROLACTIN No results found for: CHOL, TRIG, HDL, CHOLHDL, VLDL, LDLCALC No results found for: TSH  Therapeutic Level Labs: No results found for: LITHIUM No results found for: VALPROATE No components found for:  CBMZ  Current Medications: Current Outpatient Medications  Medication Sig Dispense Refill  . amphetamine-dextroamphetamine (ADDERALL XR) 20 MG 24 hr capsule Take 1 capsule (20 mg total) by mouth daily. 30 capsule 0  . amphetamine-dextroamphetamine (ADDERALL) 5 MG tablet Take 1 tablet (5 mg total) by mouth daily. After 1 pm ( but prior to 4 pm) 30 tablet 0  . omeprazole (PRILOSEC) 40 MG capsule Take 1 capsule (40 mg total) by mouth daily. 30 capsule 0  . valACYclovir (VALTREX) 500 MG tablet Takes 1 tab twice daily for 3 days when you have an outbreak 30 tablet 0  . [START ON 09/19/2017] amphetamine-dextroamphetamine (ADDERALL XR) 20 MG 24 hr capsule Take 1 capsule (20 mg total) by mouth daily. 30 capsule 0  . [START ON 10/18/2017] amphetamine-dextroamphetamine (ADDERALL XR) 20 MG 24 hr capsule Take 1 capsule (20 mg total) by mouth daily. 30 capsule 0  . [START ON 09/19/2017] amphetamine-dextroamphetamine (ADDERALL) 5 MG tablet Take 1 tablet (5 mg total) by mouth daily. At noon 30 tablet 0  . [START ON 10/18/2017] amphetamine-dextroamphetamine (ADDERALL) 5 MG tablet Take 1 tablet (5 mg total) by mouth daily. 30 tablet 0   No current facility-administered medications for this visit.      Musculoskeletal: Strength & Muscle Tone: within normal limits Gait & Station: normal Patient leans: N/A  Psychiatric Specialty Exam: Review of Systems  Psychiatric/Behavioral: The patient has insomnia (varies).   All other systems reviewed and are negative.   Blood pressure (!) 147/89, pulse 72, temperature 98.6 F (37 C), temperature source Oral, weight 182 lb (82.6  kg).Body mass index is 26.11 kg/m.  General Appearance: Casual  Eye Contact:  Fair  Speech:  Clear and Coherent  Volume:  Normal  Mood:  Euthymic  Affect:  Congruent  Thought Process:  Goal Directed and Descriptions of Associations: Intact  Orientation:  Full (Time, Place, and Person)  Thought Content: Logical   Suicidal Thoughts:  No  Homicidal Thoughts:  No  Memory:  Immediate;   Fair Recent;   Fair Remote;   Fair  Judgement:  Fair  Insight:  Fair  Psychomotor Activity:  Normal  Concentration:  Concentration: Fair and Attention Span: Fair  Recall:  [employer] of Knowledge: Fair  Language:  Fair  Akathisia:  No  Handed:  Right  AIMS (if indicated): na  Assets:  Communication Skills Desire for Improvement Social Support  ADL's:  Intact  Cognition: WNL  Sleep:  varies since he has a new born   Screenings: PHQ2-9     Office Visit from 11/18/2014 in [facility] at [facility] Total Score  0       Assessment and Plan: Jeptha is a 37 year old Caucasian male who has a history of ADHD.  Patient continues to do well on the current medication regimen.  Continue plan as noted below.  Plan ADHD Continue Adderall XR 20 mg p.o. daily Continue Adderall 5 mg p.o. q. Noon. Reviewed  controlled substance database. Provided patient with 3 prescriptions for each Adderall medication, the last one to be filled on or after 10/18/2017.  Elevated blood pressure Patient will continue to keep close monitor of his blood pressure.  He reports it has been within normal limits.  Tobacco use disorder He reports he has been trying to quit.  Cannabis use disorder- in remission Patient continues to stay away.  Follow-up in clinic in 3 months or sooner if needed.  More than 50 % of the time was spent for psychoeducation and supportive psychotherapy and care coordination.  This note was generated in part or whole with voice recognition software. Voice recognition is usually  quite accurate but there are transcription errors that can and very often do occur. I apologize for any typographical errors that were not detected and corrected.       Jomarie Longs, MD 08/20/2017, 3:32 PM

## 2017-08-20 NOTE — Telephone Encounter (Signed)
called pharmacy asked if they would send over the information for prior authorization. went ahead and got id # [id number] and bin # [phone]

## 2017-08-20 NOTE — Telephone Encounter (Signed)
went ahead and did the prior auth for both the adderall xr  and the adderall .

## 2017-09-15 ENCOUNTER — Other Ambulatory Visit: Payer: Self-pay | Admitting: Internal Medicine

## 2017-11-20 ENCOUNTER — Encounter: Payer: Self-pay | Admitting: Psychiatry

## 2017-11-20 ENCOUNTER — Other Ambulatory Visit: Payer: Self-pay

## 2017-11-20 ENCOUNTER — Ambulatory Visit (INDEPENDENT_AMBULATORY_CARE_PROVIDER_SITE_OTHER): Payer: 59 | Admitting: Psychiatry

## 2017-11-20 VITALS — BP 150/88 | HR 86 | Temp 97.8°F | Wt 176.4 lb

## 2017-11-20 DIAGNOSIS — F900 Attention-deficit hyperactivity disorder, predominantly inattentive type: Secondary | ICD-10-CM | POA: Diagnosis not present

## 2017-11-20 DIAGNOSIS — R030 Elevated blood-pressure reading, without diagnosis of hypertension: Secondary | ICD-10-CM

## 2017-11-20 DIAGNOSIS — F1211 Cannabis abuse, in remission: Secondary | ICD-10-CM

## 2017-11-20 DIAGNOSIS — F17200 Nicotine dependence, unspecified, uncomplicated: Secondary | ICD-10-CM

## 2017-11-20 MED ORDER — AMPHETAMINE-DEXTROAMPHET ER 20 MG PO CP24: 20.0000 mg | ORAL_CAPSULE | Freq: Every day | ORAL | 0 refills | Status: DC

## 2017-11-20 MED ORDER — AMPHETAMINE-DEXTROAMPHETAMINE 5 MG PO TABS: 5.0000 mg | ORAL_TABLET | Freq: Every day | ORAL | 0 refills | Status: DC

## 2017-11-20 MED ORDER — AMPHETAMINE-DEXTROAMPHET ER 20 MG PO CP24
20.0000 mg | ORAL_CAPSULE | Freq: Every day | ORAL | 0 refills | Status: DC
Start: 2017-12-19 — End: 2018-04-24

## 2017-11-20 NOTE — Progress Notes (Signed)
BH MD OP Progress Note  11/20/2017 9:16 AM Peter PalmerDavid C Kramer  MRN:  [id number]  Chief Complaint: ' I am here for follow up.'  Chief Complaint    Follow-up; Medication Refill     HPI: Peter HuaDavid is a 37 year old Caucasian male, married, employed, lives [place], has a history of ADHD, presented to the clinic today for a follow-up visit.  Patient today reports he has been doing well on the current Adderall dosage.  He reports he is able to focus well at work.  He denies any side effects to the medication like appetite suppression or sleeplessness or increased agitation.  Patient reports he however has had some psychosocial stressors at home.  He reports his daughter swallowed a coin recently and had to be taken to the emergency department.  They are able to take it out and she is doing well.  Patient reports they were able to take a vacation to [place] together as a family and he enjoyed it.  Patient denies any suicidality.  Patient denies any perceptual disturbances.  Patient reports he has not been smoking cannabis at all.  He reports he used to smoke very rarely in the past but has not done it in a long time.  His blood pressure continues to be elevated and it has been elevated  the past few visits.  He reports he has to find a new primary medical doctor.  Discussed getting an EKG done.  Patient agrees with plan.  Visit Diagnosis:    ICD-10-CM   1. Attention deficit hyperactivity disorder (ADHD), predominantly inattentive type F90.0 amphetamine-dextroamphetamine (ADDERALL XR) 20 MG 24 hr capsule    amphetamine-dextroamphetamine (ADDERALL) 5 MG tablet  2. Tobacco use disorder F17.200   3. Cannabis use disorder, mild, in sustained remission, abuse F12.11   4. Elevated BP without diagnosis of hypertension R03.0     Past Psychiatric History: I have reviewed past psychiatric history from my progress note on 06/23/2017.  Past trials of Vyvanse  Past Medical History:  Past Medical History:   Diagnosis Date  . ADHD (attention deficit hyperactivity disorder)   . Chicken pox   . Essential hypertension 08/08/2016  . Generalized anxiety disorder 07/17/2015  . Genital herpes   . Smoker 11/18/2014   History reviewed. No pertinent surgical history.  Family Psychiatric History: I have reviewed family psychiatric history from my progress note on 06/23/2017.  Family History:  Family History  Problem Relation Age of Onset  . ADD / ADHD Father   . Heart disease Maternal Grandmother   . Cancer Paternal Grandfather        brain  . ADD / ADHD Sister   . ADD / ADHD Sister   . Diabetes Neg Hx   . Stroke Neg Hx     Social History: I have reviewed social history from my progress note on 06/23/2017. Social History   Socioeconomic History  . Marital status: Married    Spouse name: emily  . Number of children: 2  . Years of education: Not on file  . Highest education level: GED or equivalent  Occupational History  . Occupation: [employer]    Comment: full time  Social Needs  . Financial resource strain: Very hard  . Food insecurity:    Worry: Sometimes true    Inability: Sometimes true  . Transportation needs:    Medical: No    Non-medical: No  Tobacco Use  . Smoking status: Current Every Day Smoker    Packs/day:  1.00    Years: 14.00    Pack years: 14.00    Types: Cigarettes  . Smokeless tobacco: Never Used  . Tobacco comment: last use was 1 month ago  Substance and Sexual Activity  . Alcohol use: No    Alcohol/week: 0.0 standard drinks    Frequency: Never    Comment: occasional  . Drug use: No  . Sexual activity: Yes    Birth control/protection: None  Lifestyle  . Physical activity:    Days per week: 3 days    Minutes per session: 120 min  . Stress: To some extent  Relationships  . Social connections:    Talks on phone: More than three times a week    Gets together: Once a week    Attends religious service: Never    Active member of club or organization: No     Attends meetings of clubs or organizations: Never    Relationship status: Married  Other Topics Concern  . Not on file  Social History Narrative  . Not on file    Allergies:  Allergies  Allergen Reactions  . Bee Venom Swelling    Metabolic Disorder Labs: No results found for: HGBA1C, MPG No results found for: PROLACTIN No results found for: CHOL, TRIG, HDL, CHOLHDL, VLDL, LDLCALC No results found for: TSH  Therapeutic Level Labs: No results found for: LITHIUM No results found for: VALPROATE No components found for:  CBMZ  Current Medications: Current Outpatient Medications  Medication Sig Dispense Refill  . amphetamine-dextroamphetamine (ADDERALL XR) 20 MG 24 hr capsule Take 1 capsule (20 mg total) by mouth daily. 30 capsule 0  . [START ON 01/17/2018] amphetamine-dextroamphetamine (ADDERALL XR) 20 MG 24 hr capsule Take 1 capsule (20 mg total) by mouth daily. 30 capsule 0  . [START ON 12/19/2017] amphetamine-dextroamphetamine (ADDERALL XR) 20 MG 24 hr capsule Take 1 capsule (20 mg total) by mouth daily. 30 capsule 0  . amphetamine-dextroamphetamine (ADDERALL) 5 MG tablet Take 1 tablet (5 mg total) by mouth daily. After 1 pm ( but prior to 4 pm) 30 tablet 0  . [START ON 01/17/2018] amphetamine-dextroamphetamine (ADDERALL) 5 MG tablet Take 1 tablet (5 mg total) by mouth daily. 30 tablet 0  . [START ON 12/19/2017] amphetamine-dextroamphetamine (ADDERALL) 5 MG tablet Take 1 tablet (5 mg total) by mouth daily. At noon 30 tablet 0  . omeprazole (PRILOSEC) 40 MG capsule Take 1 capsule (40 mg total) by mouth daily. 30 capsule 0  . valACYclovir (VALTREX) 500 MG tablet Takes 1 tab twice daily for 3 days when you have an outbreak 30 tablet 0   No current facility-administered medications for this visit.      Musculoskeletal: Strength & Muscle Tone: within normal limits Gait & Station: normal Patient leans: N/A  Psychiatric Specialty Exam: Review of Systems  Psychiatric/Behavioral:  Negative for depression. The patient is not nervous/anxious.   All other systems reviewed and are negative.   Blood pressure (!) 150/88, pulse 86, temperature 97.8 F (36.6 C), temperature source Oral, weight 176 lb 5.8 oz (80 kg).Body mass index is 25.31 kg/m.  General Appearance: Casual  Eye Contact:  Fair  Speech:  Normal Rate  Volume:  Normal  Mood:  Euthymic  Affect:  Congruent  Thought Process:  Goal Directed and Descriptions of Associations: Intact  Orientation:  Full (Time, Place, and Person)  Thought Content: Logical   Suicidal Thoughts:  No  Homicidal Thoughts:  No  Memory:  Immediate;   Fair Recent;  Fair Remote;   Fair  Judgement:  Fair  Insight:  Fair  Psychomotor Activity:  Normal  Concentration:  Concentration: Fair and Attention Span: Fair  Recall:  [employer] of Knowledge: Fair  Language: Fair  Akathisia:  No  Handed:  Right  AIMS (if indicated): na  Assets:  Communication Skills Desire for Improvement Social Support  ADL's:  Intact  Cognition: WNL  Sleep:  Fair   Screenings: PHQ2-9     Office Visit from 11/18/2014 in [facility] at [facility] Total Score  0       Assessment and Plan: Temitope is a 37 year old Caucasian male who has a history of ADHD, presented to the clinic today for a follow-up visit.  He continues to do well on the medication regimen except has continued elevated blood pressure reading today.  This was discussed with patient in the past and he had reported that it has been within normal limits when he had taken it at home.  Discussed with patient about going to his primary medical doctor as well as getting an EKG.  Plan as noted below.  Plan ADHD Continue Adderall XR 20 mg p.o. daily Continue Adderall 5 mg p.o. q. noon Reviewed [place] controlled substance database. Provided patient with 3 prescription for each Adderall, last one to be filled on or after 01/17/2018.  For elevated blood pressure Patient to follow-up  with primary medical doctor as well as get an EKG done.  Discussed the effect of stimulant on his cardiac health.  For tobacco use disorder Provided smoking cessation counseling.  He is trying to cut down.  Cannabis use in remission Patient continues to stay away from cannabis.  Follow-up in clinic in 3 months or sooner if needed.  More than 50 % of the time was spent for psychoeducation and supportive psychotherapy and care coordination.  This note was generated in part or whole with voice recognition software. Voice recognition is usually quite accurate but there are transcription errors that can and very often do occur. I apologize for any typographical errors that were not detected and corrected.       Jomarie Longs, MD 11/20/2017, 9:16 AM

## 2018-02-16 ENCOUNTER — Ambulatory Visit (INDEPENDENT_AMBULATORY_CARE_PROVIDER_SITE_OTHER): Payer: 59 | Admitting: Psychiatry

## 2018-02-16 ENCOUNTER — Encounter: Payer: Self-pay | Admitting: Psychiatry

## 2018-02-16 VITALS — BP 157/85 | HR 85 | Temp 98.7°F | Wt 179.6 lb

## 2018-02-16 DIAGNOSIS — F1211 Cannabis abuse, in remission: Secondary | ICD-10-CM | POA: Diagnosis not present

## 2018-02-16 DIAGNOSIS — F900 Attention-deficit hyperactivity disorder, predominantly inattentive type: Secondary | ICD-10-CM

## 2018-02-16 DIAGNOSIS — R030 Elevated blood-pressure reading, without diagnosis of hypertension: Secondary | ICD-10-CM

## 2018-02-16 DIAGNOSIS — F17200 Nicotine dependence, unspecified, uncomplicated: Secondary | ICD-10-CM | POA: Diagnosis not present

## 2018-02-16 MED ORDER — BUPROPION HCL 75 MG PO TABS: 75.0000 mg | ORAL_TABLET | Freq: Every morning | ORAL | 1 refills | Status: DC

## 2018-02-16 MED ORDER — AMPHETAMINE-DEXTROAMPHETAMINE 5 MG PO TABS: 5.0000 mg | ORAL_TABLET | Freq: Every day | ORAL | 0 refills | Status: DC

## 2018-02-16 MED ORDER — AMPHETAMINE-DEXTROAMPHET ER 20 MG PO CP24: 20.0000 mg | ORAL_CAPSULE | Freq: Every day | ORAL | 0 refills | Status: DC

## 2018-02-16 NOTE — Patient Instructions (Signed)
       Bupropion tablets (Depression/Mood Disorders) What is this medicine? BUPROPION (byoo PROE pee on) is used to treat depression. This medicine may be used for other purposes; ask your health care provider or pharmacist if you have questions. COMMON BRAND NAME(S): Wellbutrin What should I tell my health care provider before I take this medicine? They need to know if you have any of these conditions: -an eating disorder, such as anorexia or bulimia -bipolar disorder or psychosis -diabetes or high blood sugar, treated with medication -glaucoma -heart disease, previous heart attack, or irregular heart beat -head injury or brain tumor -high blood pressure -kidney or liver disease -seizures -suicidal thoughts or a previous suicide attempt -Tourette's syndrome -weight loss -an unusual or allergic reaction to bupropion, other medicines, foods, dyes, or preservatives -breast-feeding -pregnant or trying to become pregnant How should I use this medicine? Take this medicine by mouth with a glass of water. Follow the directions on the prescription label. You can take it with or without food. If it upsets your stomach, take it with food. Take your medicine at regular intervals. Do not take your medicine more often than directed. Do not stop taking this medicine suddenly except upon the advice of your doctor. Stopping this medicine too quickly may cause serious side effects or your condition may worsen. A special MedGuide will be given to you by the pharmacist with each prescription and refill. Be sure to read this information carefully each time. Talk to your pediatrician regarding the use of this medicine in children. Special care may be needed. Overdosage: If you think you have taken too much of this medicine contact a poison control center or emergency room at once. NOTE: This medicine is only for you. Do not share this medicine with others. What if I miss a dose? If you miss a dose,  take it as soon as you can. If it is less than four hours to your next dose, take only that dose and skip the missed dose. Do not take double or extra doses. What may interact with this medicine? Do not take this medicine with any of the following medications: -linezolid -MAOIs like Azilect, Carbex, Eldepryl, Marplan, Nardil, and Parnate -methylene blue (injected into a vein) -other medicines that contain bupropion like Zyban This medicine may also interact with the following medications: -alcohol -certain medicines for anxiety or sleep -certain medicines for blood pressure like metoprolol, propranolol -certain medicines for depression or psychotic disturbances -certain medicines for HIV or AIDS like efavirenz, lopinavir, nelfinavir, ritonavir -certain medicines for irregular heart beat like propafenone, flecainide -certain medicines for Parkinson's disease like amantadine, levodopa -certain medicines for seizures like carbamazepine, phenytoin, phenobarbital -cimetidine -clopidogrel -cyclophosphamide -digoxin -furazolidone -isoniazid -nicotine -orphenadrine -procarbazine -steroid medicines like prednisone or cortisone -stimulant medicines for attention disorders, weight loss, or to stay awake -tamoxifen -theophylline -thiotepa -ticlopidine -tramadol -warfarin This list may not describe all possible interactions. Give your health care provider a list of all the medicines, herbs, non-prescription drugs, or dietary supplements you use. Also tell them if you smoke, drink alcohol, or use illegal drugs. Some items may interact with your medicine. What should I watch for while using this medicine? Tell your doctor if your symptoms do not get better or if they get worse. Visit your doctor or health care professional for regular checks on your progress. Because it may take several weeks to see the full effects of this medicine, it is important to continue your treatment as prescribed by    your doctor. Patients and their families should watch out for new or worsening thoughts of suicide or depression. Also watch out for sudden changes in feelings such as feeling anxious, agitated, panicky, irritable, hostile, aggressive, impulsive, severely restless, overly excited and hyperactive, or not being able to sleep. If this happens, especially at the beginning of treatment or after a change in dose, call your health care professional. Avoid alcoholic drinks while taking this medicine. Drinking excessive alcoholic beverages, using sleeping or anxiety medicines, or quickly stopping the use of these agents while taking this medicine may increase your risk for a seizure. Do not drive or use heavy machinery until you know how this medicine affects you. This medicine can impair your ability to perform these tasks. Do not take this medicine close to bedtime. It may prevent you from sleeping. Your mouth may get dry. Chewing sugarless gum or sucking hard candy, and drinking plenty of water may help. Contact your doctor if the problem does not go away or is severe. What side effects may I notice from receiving this medicine? Side effects that you should report to your doctor or health care professional as soon as possible: -allergic reactions like skin rash, itching or hives, swelling of the face, lips, or tongue -breathing problems -changes in vision -confusion -elevated mood, decreased need for sleep, racing thoughts, impulsive behavior -fast or irregular heartbeat -hallucinations, loss of contact with reality -increased blood pressure -redness, blistering, peeling or loosening of the skin, including inside the mouth -seizures -suicidal thoughts or other mood changes -unusually weak or tired -vomiting Side effects that usually do not require medical attention (report to your doctor or health care professional if they continue or are bothersome): -constipation -headache -loss of  appetite -nausea -tremors -weight loss This list may not describe all possible side effects. Call your doctor for medical advice about side effects. You may report side effects to FDA at 1-800-FDA-1088. Where should I keep my medicine? Keep out of the reach of children. Store at room temperature between 20 and 25 degrees C (68 and 77 degrees F), away from direct sunlight and moisture. Keep tightly closed. Throw away any unused medicine after the expiration date. NOTE: This sheet is a summary. It may not cover all possible information. If you have questions about this medicine, talk to your doctor, pharmacist, or health care provider.  2018 Elsevier/Gold Standard (2015-09-01 13:44:21)  

## 2018-02-16 NOTE — Progress Notes (Signed)
BH MD OP Progress Note  02/16/2018 5:31 PM Peter Kramer  MRN:  [id number]  Chief Complaint: ' I am here for follow up.' Chief Complaint    Follow-up; Medication Refill     HPI: Peter Kramer is a 37 year old Caucasian male, married, employed, lives [place], has a history of ADHD, presented to the clinic today for a follow-up visit.  Patient today reports he is doing well on the Adderall with regards to his concentration and focus.  He reports he is able to do well at work.  He denies any side effects like appetite suppression or increased anxiety symptoms.  He does have some sleep issues which is more so because of having a baby at home and he sometimes have to wake up to help out with the baby.  Patient reports he has been struggling with some relationship struggles with his wife.  He reports his wife has been out of work for at least 3-1/2 years now and he would like her to at least get something part-time.  Patient reports he does not see any initiative on her part which is kind of affecting him emotionally.  He however reports he would like to have that discussion with her again and is hoping things will change.  Patient reports he continues to want to cut down on his smoking cigarettes.  He is interested in Wellbutrin.  He did try Chantix in the past and had side effects of beer dreams of it.  Patient continues to have elevated blood pressure today.  Discussed with patient that he needs to get in touch with his primary medical doctor as well as get an EKG done if he continues to want to be on stimulant medications.  He reports he has upcoming appointment on December 9.  Denies any suicidality, perceptual disturbances or homicidality.  Patient continues to stay away from cannabis. Visit Diagnosis:    ICD-10-CM   1. Attention deficit hyperactivity disorder (ADHD), predominantly inattentive type F90.0 amphetamine-dextroamphetamine (ADDERALL XR) 20 MG 24 hr capsule     amphetamine-dextroamphetamine (ADDERALL) 5 MG tablet  2. Tobacco use disorder F17.200   3. Cannabis use disorder, mild, in sustained remission, abuse F12.11   4. Elevated BP without diagnosis of hypertension R03.0     Past Psychiatric History: Have reviewed past psychiatric history from my progress note on 06/23/2017.  Past trials of Vyvanse  Past Medical History:  Past Medical History:  Diagnosis Date  . ADHD (attention deficit hyperactivity disorder)   . Chicken pox   . Essential hypertension 08/08/2016  . Generalized anxiety disorder 07/17/2015  . Genital herpes   . Smoker 11/18/2014   History reviewed. No pertinent surgical history.  Family Psychiatric History: Have reviewed family psychiatric history from my progress note on 06/23/2017  Family History:  Family History  Problem Relation Age of Onset  . ADD / ADHD Father   . Heart disease Maternal Grandmother   . Cancer Paternal Grandfather        brain  . ADD / ADHD Sister   . ADD / ADHD Sister   . Diabetes Neg Hx   . Stroke Neg Hx     Social History: Reviewed social history from my progress note on 06/23/2017 Social History   Socioeconomic History  . Marital status: Married    Spouse name: Peter Kramer  . Number of children: 2  . Years of education: Not on file  . Highest education level: GED or equivalent  Occupational History  . Occupation:  [employer]    Comment: full time  Social Needs  . Financial resource strain: Very hard  . Food insecurity:    Worry: Sometimes true    Inability: Sometimes true  . Transportation needs:    Medical: No    Non-medical: No  Tobacco Use  . Smoking status: Current Every Day Smoker    Packs/day: 1.00    Years: 14.00    Pack years: 14.00    Types: Cigarettes  . Smokeless tobacco: Never Used  . Tobacco comment: last use was 1 month ago  Substance and Sexual Activity  . Alcohol use: No    Alcohol/week: 0.0 standard drinks    Frequency: Never    Comment: occasional  . Drug use: No   . Sexual activity: Yes    Birth control/protection: None  Lifestyle  . Physical activity:    Days per week: 3 days    Minutes per session: 120 min  . Stress: To some extent  Relationships  . Social connections:    Talks on phone: More than three times a week    Gets together: Once a week    Attends religious service: Never    Active member of club or organization: No    Attends meetings of clubs or organizations: Never    Relationship status: Married  Other Topics Concern  . Not on file  Social History Narrative  . Not on file    Allergies:  Allergies  Allergen Reactions  . Bee Venom Swelling    Metabolic Disorder Labs: No results found for: HGBA1C, MPG No results found for: PROLACTIN No results found for: CHOL, TRIG, HDL, CHOLHDL, VLDL, LDLCALC No results found for: TSH  Therapeutic Level Labs: No results found for: LITHIUM No results found for: VALPROATE No components found for:  CBMZ  Current Medications: Current Outpatient Medications  Medication Sig Dispense Refill  . amphetamine-dextroamphetamine (ADDERALL XR) 20 MG 24 hr capsule Take 1 capsule (20 mg total) by mouth daily. 30 capsule 0  . amphetamine-dextroamphetamine (ADDERALL XR) 20 MG 24 hr capsule Take 1 capsule (20 mg total) by mouth daily. 30 capsule 0  . [START ON 02/20/2018] amphetamine-dextroamphetamine (ADDERALL XR) 20 MG 24 hr capsule Take 1 capsule (20 mg total) by mouth daily. 30 capsule 0  . amphetamine-dextroamphetamine (ADDERALL) 5 MG tablet Take 1 tablet (5 mg total) by mouth daily. 30 tablet 0  . amphetamine-dextroamphetamine (ADDERALL) 5 MG tablet Take 1 tablet (5 mg total) by mouth daily. At noon 30 tablet 0  . [START ON 02/20/2018] amphetamine-dextroamphetamine (ADDERALL) 5 MG tablet Take 1 tablet (5 mg total) by mouth daily. After 1 pm ( but prior to 4 pm) 30 tablet 0  . valACYclovir (VALTREX) 500 MG tablet Takes 1 tab twice daily for 3 days when you have an outbreak 30 tablet 0  .  buPROPion (WELLBUTRIN) 75 MG tablet Take 1 tablet (75 mg total) by mouth every morning. Smoking cessation 30 tablet 1  . omeprazole (PRILOSEC) 40 MG capsule Take 1 capsule (40 mg total) by mouth daily. 30 capsule 0   No current facility-administered medications for this visit.      Musculoskeletal: Strength & Muscle Tone: within normal limits Gait & Station: normal Patient leans: N/A  Psychiatric Specialty Exam: Review of Systems  Psychiatric/Behavioral: Negative for depression and substance abuse. The patient is not nervous/anxious.   All other systems reviewed and are negative.   Blood pressure (!) 157/85, pulse 85, temperature 98.7 F (37.1 C), temperature source  Oral, weight 179 lb 9.6 oz (81.5 kg).Body mass index is 25.77 kg/m.  General Appearance: Casual  Eye Contact:  Fair  Speech:  Clear and Coherent  Volume:  Normal  Mood:  Euthymic  Affect:  Appropriate  Thought Process:  Goal Directed and Descriptions of Associations: Intact  Orientation:  Full (Time, Place, and Person)  Thought Content: Logical   Suicidal Thoughts:  No  Homicidal Thoughts:  No  Memory:  Immediate;   Fair Recent;   Fair Remote;   Fair  Judgement:  Fair  Insight:  Fair  Psychomotor Activity:  Normal  Concentration:  Concentration: Fair and Attention Span: Fair  Recall:  [employer] of Knowledge: Fair  Language: Fair  Akathisia:  No  Handed:  Right  AIMS (if indicated): denies tremors, rigidity,stiffness  Assets:  Communication Skills Desire for Improvement  ADL's:  Intact  Cognition: WNL  Sleep:  Fair   Screenings: PHQ2-9     Office Visit from 11/18/2014 in [facility] at [facility] Total Score  0       Assessment and Plan: Breckyn is a 37 year old Caucasian male who has a history of ADHD, presented to the clinic today for a follow-up visit.  Patient reports he is currently doing well on the medication regimen for his ADHD.  He continues to have elevated blood pressure  reading and he has upcoming appointment with his PMD on December 9.  Discussed with patient that he also needs an EKG done.Plan as noted below.  Plan ADHD Continue Adderall XR 20 mg p.o. daily Continue Adderall 5 mg p.o. q. noon I have reviewed [place] controlled substance database.  For elevated blood pressure Discussed with patient to get an EKG as well as get his blood pressure evaluated. He has upcoming appointment with PMD on December 9.  For tobacco use disorder Provided smoking cessation counseling.   Start Wellbutrin 75 mg p.o. daily.  Follow-up in clinic in 1 month or sooner if needed.  Patient also advised he could call after his PMD appointment.  Patient to ask his PMD to fax his EKG as well as any medication changes for his elevated blood pressure.  More than 50 % of the time was spent for psychoeducation and supportive psychotherapy and care coordination.  This note was generated in part or whole with voice recognition software. Voice recognition is usually quite accurate but there are transcription errors that can and very often do occur. I apologize for any typographical errors that were not detected and corrected.       Jomarie Longs, MD 02/16/2018, 5:31 PM

## 2018-03-23 ENCOUNTER — Telehealth: Payer: Self-pay

## 2018-03-23 DIAGNOSIS — F900 Attention-deficit hyperactivity disorder, predominantly inattentive type: Secondary | ICD-10-CM

## 2018-03-23 MED ORDER — AMPHETAMINE-DEXTROAMPHETAMINE 5 MG PO TABS: 5.0000 mg | ORAL_TABLET | Freq: Every day | ORAL | 0 refills | Status: DC

## 2018-03-23 MED ORDER — AMPHETAMINE-DEXTROAMPHET ER 20 MG PO CP24: 20.0000 mg | ORAL_CAPSULE | Freq: Every day | ORAL | 0 refills | Status: DC

## 2018-03-23 NOTE — Telephone Encounter (Signed)
Sent Adderall XR and Adderall IR to pharmacy .  

## 2018-03-23 NOTE — Telephone Encounter (Signed)
pt called left message that he needs refills on his medications. bother adderall and wellbutrin.

## 2018-03-30 ENCOUNTER — Ambulatory Visit: Payer: 59 | Admitting: Psychiatry

## 2018-04-02 IMAGING — CT CT RENAL STONE PROTOCOL
3 of 4 series · 8 of 46 positions shown, 15 images · non-contrast
Comparison: 01/17/2009

CLINICAL DATA: Left testicular and left lower quadrant pain

EXAM:
CT ABDOMEN AND PELVIS WITHOUT CONTRAST
TECHNIQUE: Multidetector CT imaging of the abdomen and pelvis was performed
following the standard protocol without IV contrast.

[Series 4: lung bases · axial · 0.68mm/px · z∈[-199,-139]mm · 4 of 20 slices shown, 9 images]
[im 4/20  soft-tissue]
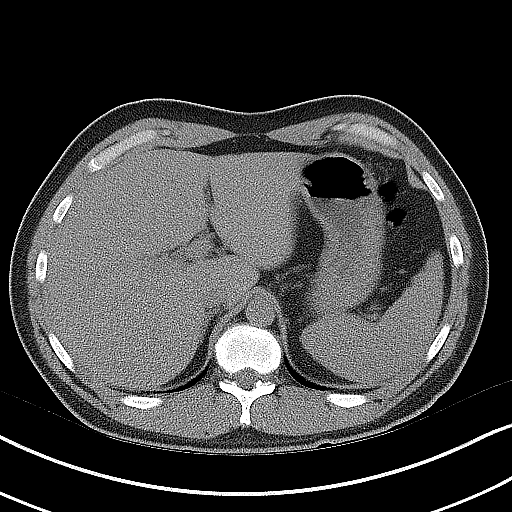
[im 4/20  lung]
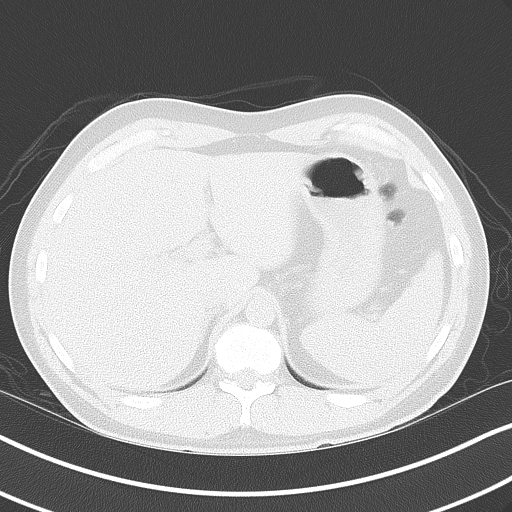
[im 4/20  bone]
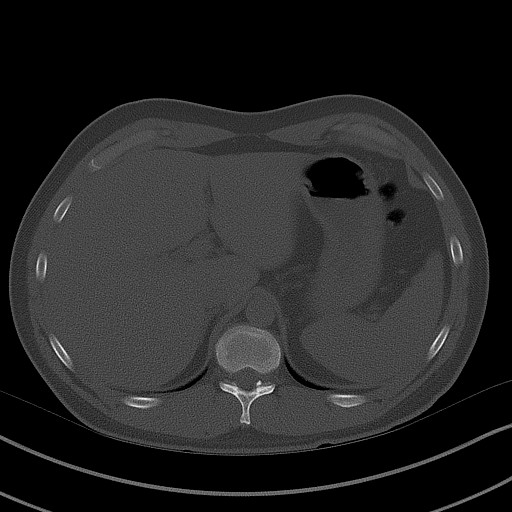
[im 8/20  soft-tissue]
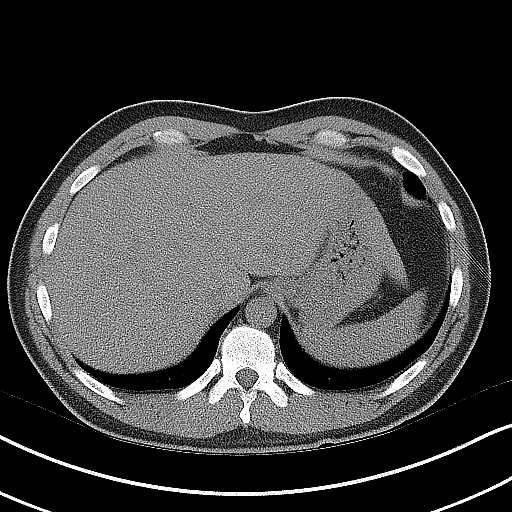
[im 8/20  lung]
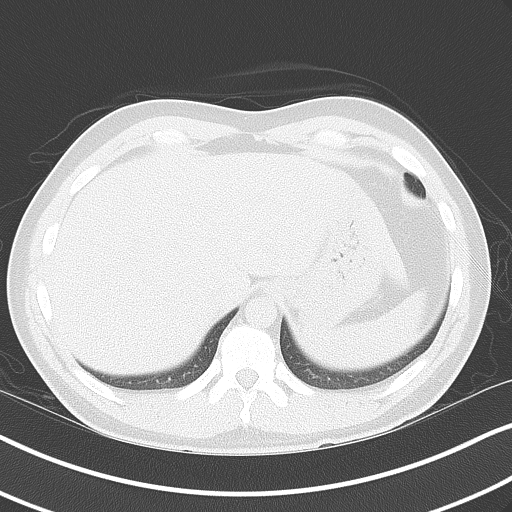
[im 12/20  soft-tissue]
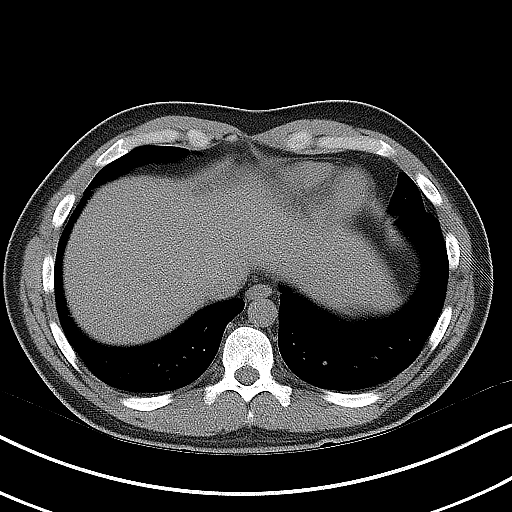
[im 12/20  lung]
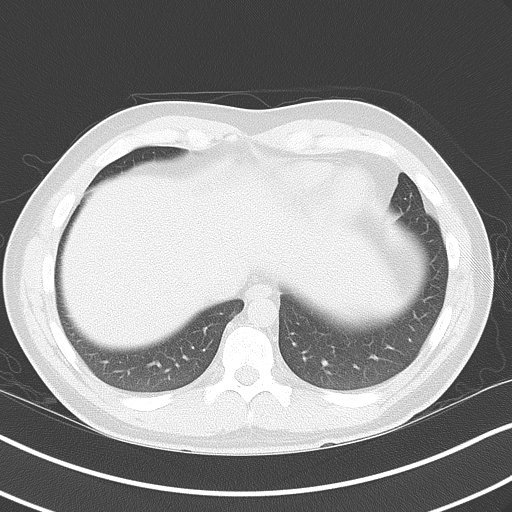
[im 16/20  soft-tissue]
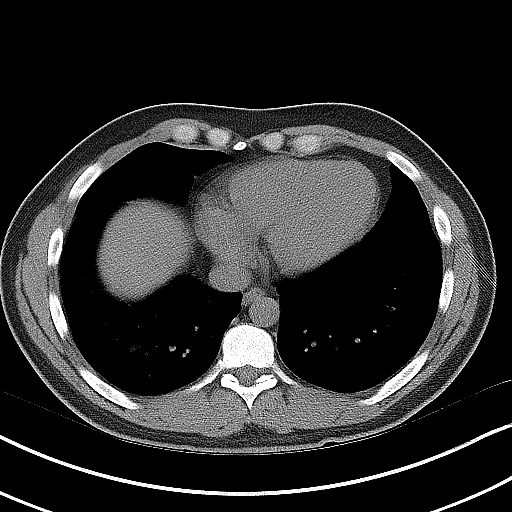
[im 16/20  lung]
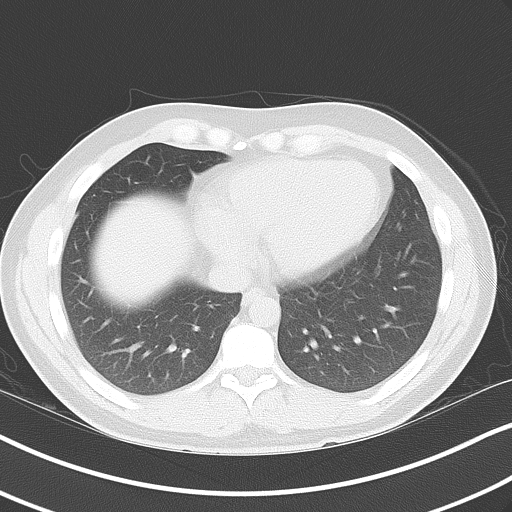

[Series 5: coronal · coronal · 0.70mm/px · 3 of 124 slices shown, 4 images]
[im 42/124  soft-tissue]
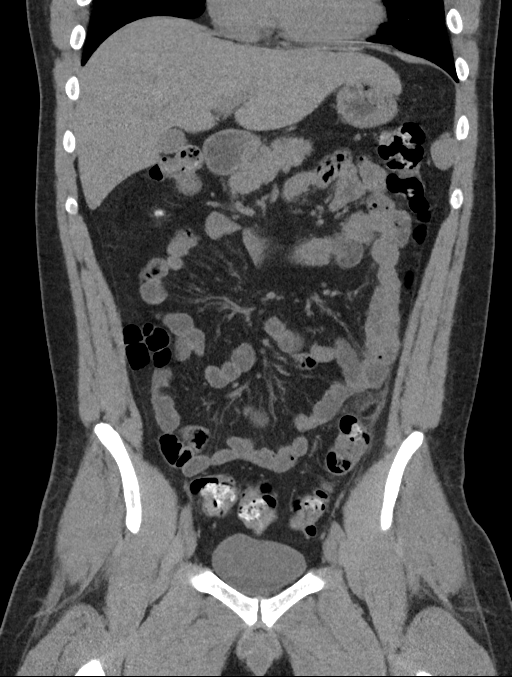
[im 55/124  soft-tissue]
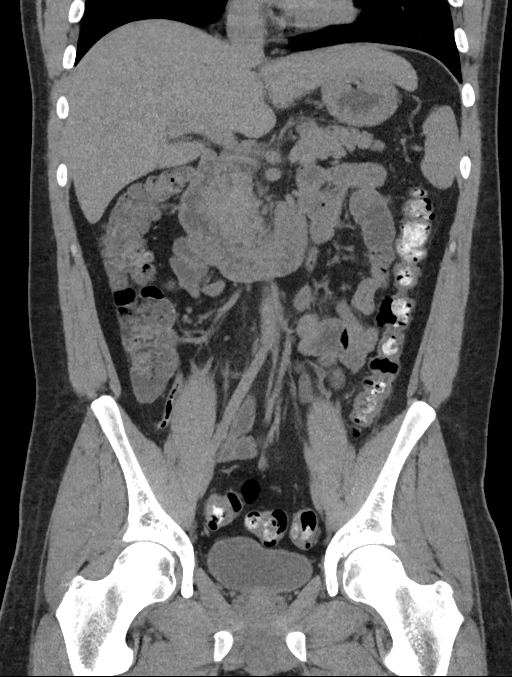
[im 55/124  bone]
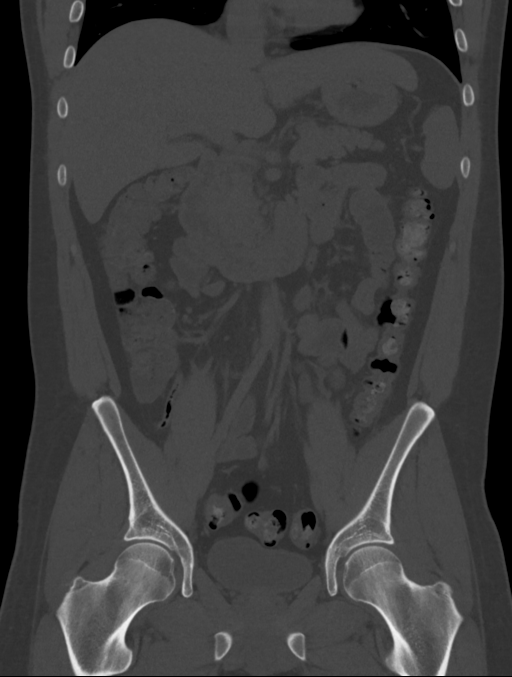
[im 69/124  soft-tissue]
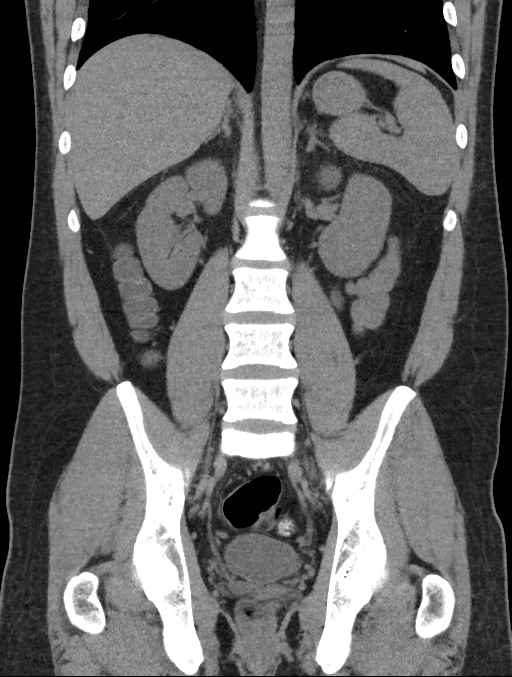

[Series 6: sagittal · sagittal · 0.52mm/px · 1 of 164 slices shown, 2 images]
[im 55/164  soft-tissue]
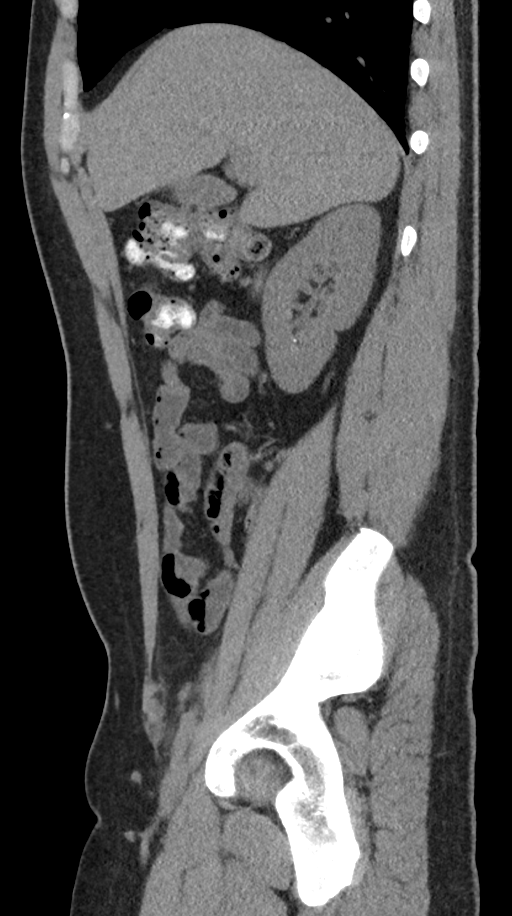
[im 55/164  bone]
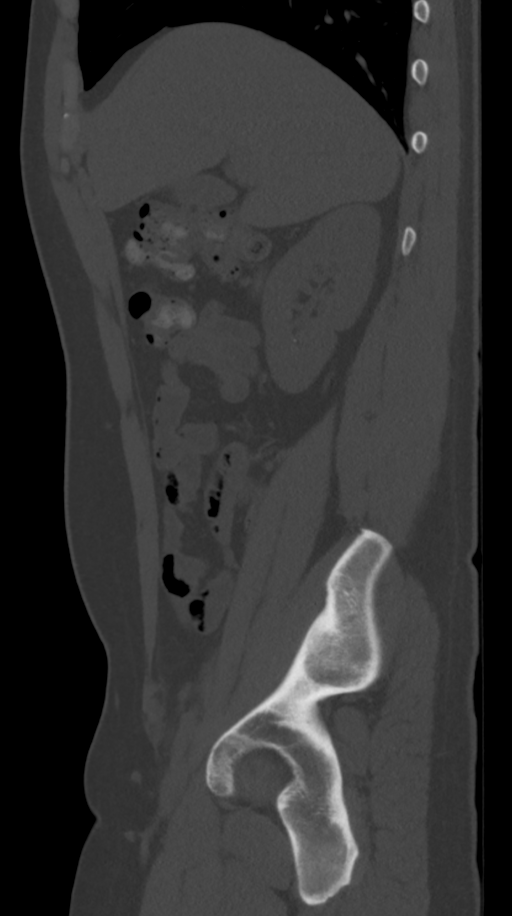

[8 of 46 positions shown; findings below may reference images not displayed]

FINDINGS: Lower chest: Lung bases are clear. No effusions. Heart is normal
size.

Hepatobiliary: No focal hepatic abnormality. Gallbladder
unremarkable.

Pancreas: No focal abnormality or ductal dilatation.

Spleen: No focal abnormality.  Normal size.

Adrenals/Urinary Tract: Punctate nonobstructing stone in the upper
pole of the left kidney. No ureteral stones or hydronephrosis.
Adrenal glands and urinary bladder unremarkable.

Stomach/Bowel: Appendix is normal. Stomach, large and small bowel
grossly unremarkable. There is stranding anterior to the proximal
sigmoid colon which appears separate from the sigmoid colon, likely
epiploic appendagitis.

Vascular/Lymphatic: No evidence of aneurysm or adenopathy.

Reproductive: No visible focal abnormality.

Other: No free fluid or free air.

Musculoskeletal: No acute bony abnormality
IMPRESSION: Stranding anterior to the proximal sigmoid colon, most compatible
with epiploic appendagitis. Underlying bowel appears unremarkable.

Normal appendix.

Punctate left upper pole nonobstructing renal stone.

## 2018-04-23 ENCOUNTER — Ambulatory Visit: Payer: 59 | Admitting: Psychiatry

## 2018-04-24 ENCOUNTER — Other Ambulatory Visit: Payer: Self-pay

## 2018-04-24 ENCOUNTER — Ambulatory Visit: Payer: 59 | Admitting: Psychiatry

## 2018-04-24 ENCOUNTER — Encounter: Payer: Self-pay | Admitting: Psychiatry

## 2018-04-24 VITALS — BP 150/85 | HR 109 | Temp 98.1°F | Wt 185.2 lb

## 2018-04-24 DIAGNOSIS — F900 Attention-deficit hyperactivity disorder, predominantly inattentive type: Secondary | ICD-10-CM

## 2018-04-24 DIAGNOSIS — F17200 Nicotine dependence, unspecified, uncomplicated: Secondary | ICD-10-CM | POA: Diagnosis not present

## 2018-04-24 DIAGNOSIS — F1211 Cannabis abuse, in remission: Secondary | ICD-10-CM

## 2018-04-24 MED ORDER — AMPHETAMINE-DEXTROAMPHET ER 20 MG PO CP24: 20.0000 mg | ORAL_CAPSULE | Freq: Every day | ORAL | 0 refills | Status: DC

## 2018-04-24 MED ORDER — GUANFACINE HCL ER 1 MG PO TB24: 1.0000 mg | ORAL_TABLET | Freq: Every day | ORAL | 1 refills | Status: DC

## 2018-04-24 NOTE — Patient Instructions (Signed)
Guanfacine extended-release oral tablets What is this medicine? GUANFACINE (GWAHN fa seen) is used to treat attention-deficit hyperactivity disorder (ADHD). This medicine may be used for other purposes; ask your health care provider or pharmacist if you have questions. COMMON BRAND NAME(S): Intuniv What should I tell my health care provider before I take this medicine? They need to know if you have any of these conditions: -high blood pressure -kidney disease -liver disease -low blood pressure -slow heart rate -an unusual or allergic reaction to guanfacine, other medicines, foods, dyes, or preservatives -pregnant or trying to get pregnant -breast-feeding How should I use this medicine? Take this medicine by mouth with a glass of water. Follow the directions on the prescription label. Do not cut, crush, or chew this medicine. Do not take this medicine with a high-fat meal. Take your medicine at regular intervals. Do not take it more often than directed. Do not stop taking except on your doctor's advice. Stopping this medicine too quickly may cause serious side effects. Ask your doctor or health care professional for advice. This drug may be prescribed for children as young as 6 years. Talk to your doctor if you have any questions. Overdosage: If you think you have taken too much of this medicine contact a poison control center or emergency room at once. NOTE: This medicine is only for you. Do not share this medicine with others. What if I miss a dose? If you miss a dose, take it as soon as you can. If it is almost time for your next dose, take only that dose. Do not take double or extra doses. If you miss 2 or more doses in a row, you should contact your doctor or health care professional. You may need to restart your medicine at a lower dose. What may interact with this medicine? -certain medicines for blood pressure, heart disease, irregular heart beat -certain medicines for depression,  anxiety, or psychotic disturbances -certain medicines for seizures like carbamazepine, phenobarbital, phenytoin -certain medicines for sleep -ketoconazole -narcotic medicines for pain -rifampin This list may not describe all possible interactions. Give your health care provider a list of all the medicines, herbs, non-prescription drugs, or dietary supplements you use. Also tell them if you smoke, drink alcohol, or use illegal drugs. Some items may interact with your medicine. What should I watch for while using this medicine? Visit your doctor or health care professional for regular checks on your progress. Check your heart rate and blood pressure as directed. Ask your doctor or health care professional what your heart rate and blood pressure should be and when you should contact him or her. You may get dizzy or drowsy. Do not drive, use machinery, or do anything that needs mental alertness until you know how this medicine affects you. Do not stand or sit up quickly, especially if you are an older patient. This reduces the risk of dizzy or fainting spells. Alcohol can make you more drowsy and dizzy. Avoid alcoholic drinks. Avoid becoming dehydrated or overheated while taking this medicine. Tell your healthcare provider if you have been vomiting and cannot take this medicine because you may be at risk for a sudden and large increase in blood pressure called rebound hypertension. Your mouth may get dry. Chewing sugarless gum or sucking hard candy, and drinking plenty of water may help. Contact your doctor if the problem does not go away or is severe. What side effects may I notice from receiving this medicine? Side effects that you should report   to your doctor or health care professional as soon as possible: -allergic reactions like skin rash, itching or hives, swelling of the face, lips, or tongue -changes in emotions or moods -chest pain or chest tightness -signs and symptoms of low blood pressure  like dizziness; feeling faint or lightheaded, falls; unusually weak or tired -unusually slow heartbeat Side effects that usually do not require medical attention (report to your doctor or health care professional if they continue or are bothersome): -drowsiness -dry mouth -headache -nausea -tiredness This list may not describe all possible side effects. Call your doctor for medical advice about side effects. You may report side effects to FDA at 1-800-FDA-1088. Where should I keep my medicine? Keep out of the reach of children. Store at room temperature between 15 and 30 degrees C (59 and 86 degrees F). Throw away any unused medicine after the expiration date. NOTE: This sheet is a summary. It may not cover all possible information. If you have questions about this medicine, talk to your doctor, pharmacist, or health care provider.  2019 Elsevier/Gold Standard (2016-06-18 19:38:26)  

## 2018-04-24 NOTE — Progress Notes (Signed)
[facility] MD OP Progress Note  04/24/2018 12:35 PM Peter Kramer Orea  MRN:  [id number]  Chief Complaint: ' I am here for follow up."  Chief Complaint    Follow-up     HPI: Peter Peter Kramer is a 38 year old Caucasian male, married, employed, lives [place] [employer] [place], has a history of ADHD, presented to the clinic today for a follow-up visit.  Patient today reports he continues to take the Adderall as prescribed.Pt reports it helps with focus and attention.  Patient reports appetite is fair.  He denies any problems with his sleep.  He reports he has started working out and has joined a gym.  He also has been eating healthy.  He was able to quit smoking few weeks ago.  He stopped taking the Wellbutrin since he felt like he did not need it anymore and it was for smoking cessation.  Patient's blood pressure continues to be elevated today.  Patient was advised to get a primary medical doctor evaluation as well as an EKG done few months ago.  Patient's blood pressure has been consistently elevated since the past several visits.  This was discussed with patient.  Discussed with him that his Adderall can be reduced to see if that will help with his blood pressure elevation.  Also discussed that Intuniv can be added to help.  He agrees with plan.  Visit Diagnosis:    ICD-10-CM   1. Attention deficit hyperactivity disorder (ADHD), predominantly inattentive type F90.0 amphetamine-dextroamphetamine (ADDERALL XR) 20 MG 24 hr capsule  2. Tobacco use disorder F17.200    in early remission  3. Cannabis use disorder, mild, in sustained remission, abuse F12.11     Past Psychiatric History: Reviewed past psychiatric history from my progress note on 06/23/2017.  Past trials of Vyvanse, Adderall immediate release, Wellbutrin -for smoking cessation.  Past Medical History:  Past Medical History:  Diagnosis Date  . ADHD (attention deficit hyperactivity disorder)   . Chicken pox   . Essential hypertension 08/08/2016  . Generalized  anxiety disorder 07/17/2015  . Genital herpes   . Smoker 11/18/2014   History reviewed. No pertinent surgical history.  Family Psychiatric History: I have reviewed family psychiatric history from my progress note on 04/13/2017  Family History:  Family History  Problem Relation Age of Onset  . ADD / ADHD Father   . Heart disease Maternal Grandmother   . Cancer Paternal Grandfather        brain  . ADD / ADHD Sister   . ADD / ADHD Sister   . Diabetes Neg Hx   . Stroke Neg Hx     Social History: Reviewed social history from my progress note on 06/23/2017. Social History   Socioeconomic History  . Marital status: Married    Spouse name: emily  . Number of children: 2  . Years of education: Not on file  . Highest education level: GED or equivalent  Occupational History  . Occupation: [employer]    Comment: full time  Social Needs  . Financial resource strain: Very hard  . Food insecurity:    Worry: Sometimes true    Inability: Sometimes true  . Transportation needs:    Medical: No    Non-medical: No  Tobacco Use  . Smoking status: Current Every Day Smoker    Packs/day: 1.00    Years: 14.00    Pack years: 14.00    Types: Cigarettes  . Smokeless tobacco: Never Used  . Tobacco comment: last use was 1 month  ago  Substance and Sexual Activity  . Alcohol use: No    Alcohol/week: 0.0 standard drinks    Frequency: Never    Comment: occasional  . Drug use: No  . Sexual activity: Yes    Birth control/protection: None  Lifestyle  . Physical activity:    Days per week: 3 days    Minutes per session: 120 min  . Stress: To some extent  Relationships  . Social connections:    Talks on phone: More than three times a week    Gets together: Once a week    Attends religious service: Never    Active member of club or organization: No    Attends meetings of clubs or organizations: Never    Relationship status: Married  Other Topics Concern  . Not on file  Social History  Narrative  . Not on file    Allergies:  Allergies  Allergen Reactions  . Bee Venom Swelling    Metabolic Disorder Labs: No results found for: HGBA1C, MPG No results found for: PROLACTIN No results found for: CHOL, TRIG, HDL, CHOLHDL, VLDL, LDLCALC No results found for: TSH  Therapeutic Level Labs: No results found for: LITHIUM No results found for: VALPROATE No components found for:  CBMZ  Current Medications: Current Outpatient Medications  Medication Sig Dispense Refill  . amphetamine-dextroamphetamine (ADDERALL XR) 20 MG 24 hr capsule Take 1 capsule (20 mg total) by mouth daily. 30 capsule 0  . amphetamine-dextroamphetamine (ADDERALL XR) 20 MG 24 hr capsule Take 1 capsule (20 mg total) by mouth daily. 30 capsule 0  . [START ON 05/24/2018] amphetamine-dextroamphetamine (ADDERALL XR) 20 MG 24 hr capsule Take 1 capsule (20 mg total) by mouth daily. 30 capsule 0  . amphetamine-dextroamphetamine (ADDERALL) 5 MG tablet Take 1 tablet (5 mg total) by mouth daily. At noon 30 tablet 0  . amphetamine-dextroamphetamine (ADDERALL) 5 MG tablet Take 1 tablet (5 mg total) by mouth daily. After 1 pm ( but prior to 4 pm) 30 tablet 0  . amphetamine-dextroamphetamine (ADDERALL) 5 MG tablet Take 1 tablet (5 mg total) by mouth daily. 30 tablet 0  . valACYclovir (VALTREX) 500 MG tablet Takes 1 tab twice daily for 3 days when you have an outbreak 30 tablet 0  . guanFACINE (INTUNIV) 1 MG TB24 ER tablet Take 1 tablet (1 mg total) by mouth at bedtime. 30 tablet 1  . omeprazole (PRILOSEC) 40 MG capsule Take 1 capsule (40 mg total) by mouth daily. 30 capsule 0   No current facility-administered medications for this visit.      Musculoskeletal: Strength & Muscle Tone: within normal limits Gait & Station: normal Patient leans: N/A  Psychiatric Specialty Exam: Review of Systems  Psychiatric/Behavioral: The patient is not nervous/anxious.   All other systems reviewed and are negative.   Blood  pressure (!) 150/85, pulse (!) 109, temperature 98.1 F (36.7 Kramer), temperature source Oral, weight 185 lb 3.2 oz (84 kg).Body mass index is 26.57 kg/m.  General Appearance: Casual  Eye Contact:  Fair  Speech:  Normal Rate  Volume:  Normal  Mood:  Euthymic  Affect:  Appropriate  Thought Process:  Goal Directed and Descriptions of Associations: Intact  Orientation:  Full (Time, Place, and Person)  Thought Content: WDL   Suicidal Thoughts:  No  Homicidal Thoughts:  No  Memory:  Immediate;   Fair Recent;   Fair Remote;   Fair  Judgement:  Fair  Insight:  Fair  Psychomotor Activity:  Normal  Concentration:  Concentration: Fair and Attention Span: Fair  Recall:  [employer] of Knowledge: Fair  Language: Fair  Akathisia:  No  Handed:  Right  AIMS (if indicated):denies tremors, rigidity,stiffness  Assets:  Communication Skills Desire for Improvement Housing Social Support Talents/Skills Transportation  ADL's:  Intact  Cognition: WNL  Sleep:  Fair   Screenings: PHQ2-9     Office Visit from 11/18/2014 in [facility] Total Score  0       Assessment and Plan: Braddox is a 38 year old Caucasian male who has a history of ADHD, presented to clinic today for a follow-up visit.  Patient reports continued benefit from the Adderall.  However his blood pressure continues to be elevated.  Hence discussed medication readjustment as well as EKG and primary medical doctor evaluation.  Plan ADHD- improving Continue Adderall extended release 20 mg p.o. daily. I have reviewed [place] controlled substance database.  For elevated blood pressure-unstable Patient has not been able to get an EKG as well as PMD evaluation yet. We will discontinue Adderall 5 mg. Will add Intuniv ER 1 mg p.o. daily to help with ADHD symptoms.  For tobacco use disorder-in remission Patient quit smoking few weeks ago. Discontinue Wellbutrin for noncompliance.  Follow-up in clinic in 2  months or sooner if needed.  In the meantime patient to follow-up with his primary medical doctor for an evaluation of elevated blood pressure as well as EKG since he is on Adderall.  I have spent atleast 15 minutes face to face with patient today. More than 50 % of the time was spent for psychoeducation and supportive psychotherapy and care coordination.  This note was generated in part or whole with voice recognition software. Voice recognition is usually quite accurate but there are transcription errors that can and very often do occur. I apologize for any typographical errors that were not detected and corrected.         Jomarie Longs, MD 04/24/2018, 12:35 PM

## 2018-06-01 ENCOUNTER — Ambulatory Visit (INDEPENDENT_AMBULATORY_CARE_PROVIDER_SITE_OTHER): Payer: 59 | Admitting: Internal Medicine

## 2018-06-01 ENCOUNTER — Encounter: Payer: Self-pay | Admitting: Internal Medicine

## 2018-06-01 VITALS — BP 152/66 | HR 90 | Temp 98.0°F | Ht 70.0 in | Wt 180.8 lb

## 2018-06-01 DIAGNOSIS — I10 Essential (primary) hypertension: Secondary | ICD-10-CM

## 2018-06-01 DIAGNOSIS — Z0000 Encounter for general adult medical examination without abnormal findings: Secondary | ICD-10-CM

## 2018-06-01 DIAGNOSIS — Z23 Encounter for immunization: Secondary | ICD-10-CM | POA: Diagnosis not present

## 2018-06-01 DIAGNOSIS — A6001 Herpesviral infection of penis: Secondary | ICD-10-CM | POA: Diagnosis not present

## 2018-06-01 DIAGNOSIS — F900 Attention-deficit hyperactivity disorder, predominantly inattentive type: Secondary | ICD-10-CM

## 2018-06-01 MED ORDER — LISINOPRIL 10 MG PO TABS: 10.0000 mg | ORAL_TABLET | Freq: Every day | ORAL | 2 refills | Status: DC

## 2018-06-01 NOTE — Assessment & Plan Note (Signed)
Stable on Adderall and Intuniv He will continue to follow with psych

## 2018-06-01 NOTE — Assessment & Plan Note (Signed)
Will restart Lisinopril  Reinforced DASH diet, aerobic exercise and smoking cessation

## 2018-06-01 NOTE — Patient Instructions (Signed)

## 2018-06-01 NOTE — Assessment & Plan Note (Signed)
No flares off Valtrex suppression Discussed condom use

## 2018-06-01 NOTE — Progress Notes (Signed)
Subjective:    Patient ID: Peter Kramer, male    DOB: [birth year], 38 y.o.   MRN: [id number]  HPI  Pt presents to the clinic today for his annual exam. He is also due to follow up chronic conditions.  ADD: Currently managed on Adderall and Intuiv. He follows with psychiatry.  GERD: Currently not an issue. He no longer takes Omeprazole. He has never had an upper GI.  Genital Herpes: He denies recent outbreak. He is no longer taking Valtrex for suppression.  GAD: Much better since he changed jobs. He follows with psychiatry but is not seeing a therapist. He does not take anything for anxiety at this time. He denies depression, SI/HI.  HTN: His BP today is 152/66. He stopped taking Lisinopril because he was positive he did not have high blood pressure.  Flu: never Tetanus: unsure Dentist: as needed  Diet: He does eat meat. He consumes some fruits and veggies. He does eat fried foods. He drinks mostly water. Exercise: none   Review of Systems      Past Medical History:  Diagnosis Date  . ADHD (attention deficit hyperactivity disorder)   . Chicken pox   . Essential hypertension 08/08/2016  . Generalized anxiety disorder 07/17/2015  . Genital herpes   . Smoker 11/18/2014    Current Outpatient Medications  Medication Sig Dispense Refill  . amphetamine-dextroamphetamine (ADDERALL XR) 20 MG 24 hr capsule Take 1 capsule (20 mg total) by mouth daily. 30 capsule 0  . amphetamine-dextroamphetamine (ADDERALL XR) 20 MG 24 hr capsule Take 1 capsule (20 mg total) by mouth daily. 30 capsule 0  . amphetamine-dextroamphetamine (ADDERALL XR) 20 MG 24 hr capsule Take 1 capsule (20 mg total) by mouth daily. 30 capsule 0  . amphetamine-dextroamphetamine (ADDERALL) 5 MG tablet Take 1 tablet (5 mg total) by mouth daily. At noon 30 tablet 0  . amphetamine-dextroamphetamine (ADDERALL) 5 MG tablet Take 1 tablet (5 mg total) by mouth daily. After 1 pm ( but prior to 4 pm) 30 tablet 0  .  amphetamine-dextroamphetamine (ADDERALL) 5 MG tablet Take 1 tablet (5 mg total) by mouth daily. 30 tablet 0  . guanFACINE (INTUNIV) 1 MG TB24 ER tablet Take 1 tablet (1 mg total) by mouth at bedtime. 30 tablet 1  . valACYclovir (VALTREX) 500 MG tablet Takes 1 tab twice daily for 3 days when you have an outbreak 30 tablet 0  . omeprazole (PRILOSEC) 40 MG capsule Take 1 capsule (40 mg total) by mouth daily. 30 capsule 0   No current facility-administered medications for this visit.     Allergies  Allergen Reactions  . Bee Venom Swelling    Family History  Problem Relation Age of Onset  . ADD / ADHD Father   . Heart disease Maternal Grandmother   . Cancer Paternal Grandfather        brain  . ADD / ADHD Sister   . ADD / ADHD Sister   . Diabetes Neg Hx   . Stroke Neg Hx     Social History   Socioeconomic History  . Marital status: Married    Spouse name: emily  . Number of children: 2  . Years of education: Not on file  . Highest education level: GED or equivalent  Occupational History  . Occupation: [employer]    Comment: full time  Social Needs  . Financial resource strain: Very hard  . Food insecurity:    Worry: Sometimes true    Inability: Sometimes  true  . Transportation needs:    Medical: No    Non-medical: No  Tobacco Use  . Smoking status: Current Every Day Smoker    Packs/day: 1.00    Years: 14.00    Pack years: 14.00    Types: Cigarettes  . Smokeless tobacco: Never Used  . Tobacco comment: last use was 1 month ago  Substance and Sexual Activity  . Alcohol use: No    Alcohol/week: 0.0 standard drinks    Frequency: Never    Comment: occasional  . Drug use: No  . Sexual activity: Yes    Birth control/protection: None  Lifestyle  . Physical activity:    Days per week: 3 days    Minutes per session: 120 min  . Stress: To some extent  Relationships  . Social connections:    Talks on phone: More than three times a week    Gets together: Once a week      Attends religious service: Never    Active member of club or organization: No    Attends meetings of clubs or organizations: Never    Relationship status: Married  . Intimate partner violence:    Fear of current or ex partner: No    Emotionally abused: No    Physically abused: No    Forced sexual activity: No  Other Topics Concern  . Not on file  Social History Narrative  . Not on file     Constitutional: Denies fever, malaise, fatigue, headache or abrupt weight changes.  HEENT: Denies eye pain, eye redness, ear pain, ringing in the ears, wax buildup, runny nose, nasal congestion, bloody nose, or sore throat. Respiratory: Denies difficulty breathing, shortness of breath, cough or sputum production.   Cardiovascular: Denies chest pain, chest tightness, palpitations or swelling in the hands or feet.  Gastrointestinal: Denies abdominal pain, bloating, constipation, diarrhea or blood in the stool.  GU: Denies urgency, frequency, pain with urination, burning sensation, blood in urine, odor or discharge. Musculoskeletal: Pt reports right shoulder pain. Denies decrease in range of motion, difficulty with gait, muscle pain or joint swelling.  Skin: Denies redness, rashes, lesions or ulcercations.  Neurological: Pt reports inattention. Denies dizziness, difficulty with memory, difficulty with speech or problems with balance and coordination.  Psych: Denies anxiety, depression, SI/HI.  No other specific complaints in a complete review of systems (except as listed in HPI above).  Objective:   Physical Exam   BP (!) 152/66   Pulse 90   Temp 98 F (36.7 C)   Ht 5\' 10"  (1.778 m)   Wt 180 lb 12.8 oz (82 kg)   SpO2 98%   BMI 25.94 kg/m  Wt Readings from Last 3 Encounters:  06/01/18 180 lb 12.8 oz (82 kg)  01/08/17 185 lb (83.9 kg)  10/10/16 177 lb 3.2 oz (80.4 kg)    General: Appears his stated age, well developed, well nourished in NAD. Skin: Warm, dry and intact. No rashes,  lesions or ulcerations noted. HEENT: Head: normal shape and size; Eyes: sclera white, no icterus, conjunctiva pink, PERRLA and EOMs intact; Ears: Tm's gray and intact, normal light reflex; Throat/Mouth: Teeth present, mucosa pink and moist, no exudate, lesions or ulcerations noted.  Neck:  Neck supple, trachea midline. No masses, lumps or thyromegaly present.  Cardiovascular: Normal rate and rhythm. S1,S2 noted.  No murmur, rubs or gallops noted. No JVD or BLE edema.  Pulmonary/Chest: Normal effort and positive vesicular breath sounds. No respiratory distress. No wheezes, rales or  ronchi noted.  Abdomen: Soft and nontender. Normal bowel sounds. No distention or masses noted. Liver, spleen and kidneys non palpable. Musculoskeletal: Strength 5/5 BUE/BLE. No difficulty with gait.  Neurological: Alert and oriented. Cranial nerves II-XII grossly intact. Coordination normal.  Psychiatric: Mood and affect normal. Behavior is normal. Judgment and thought content normal.     BMET    Component Value Date/Time   NA 138 01/08/2017 1239   K 4.0 01/08/2017 1239   CL 103 01/08/2017 1239   CO2 27 01/08/2017 1239   GLUCOSE 99 01/08/2017 1239   BUN 18 01/08/2017 1239   CREATININE 0.94 01/08/2017 1239   CALCIUM 9.3 01/08/2017 1239   GFRNONAA >60 01/08/2017 1239   GFRAA >60 01/08/2017 1239    Lipid Panel  No results found for: CHOL, TRIG, HDL, CHOLHDL, VLDL, LDLCALC  CBC    Component Value Date/Time   WBC 10.5 01/08/2017 1239   RBC 5.21 01/08/2017 1239   HGB 15.2 01/08/2017 1239   HCT 44.0 01/08/2017 1239   PLT 249 01/08/2017 1239   MCV 84.5 01/08/2017 1239   MCH 29.2 01/08/2017 1239   MCHC 34.6 01/08/2017 1239   RDW 14.5 01/08/2017 1239    Hgb A1C No results found for: HGBA1C         Assessment & Plan:   Preventative Health Maintenance:  Flu shot UTD Tdap today

## 2018-06-01 NOTE — Progress Notes (Signed)
Pre visit review using our clinic review tool, if applicable. No additional management support is needed unless otherwise documented below in the visit note. 

## 2018-06-02 ENCOUNTER — Ambulatory Visit: Payer: 59 | Admitting: Psychiatry

## 2018-06-02 NOTE — Addendum Note (Signed)
Addended by: Alvina Chou on: 06/02/2018 08:24 AM   Modules accepted: Orders

## 2018-06-17 ENCOUNTER — Other Ambulatory Visit: Payer: Self-pay

## 2018-06-17 ENCOUNTER — Encounter: Payer: Self-pay | Admitting: Psychiatry

## 2018-06-17 ENCOUNTER — Ambulatory Visit (INDEPENDENT_AMBULATORY_CARE_PROVIDER_SITE_OTHER): Payer: Self-pay | Admitting: Psychiatry

## 2018-06-17 DIAGNOSIS — F17200 Nicotine dependence, unspecified, uncomplicated: Secondary | ICD-10-CM

## 2018-06-17 DIAGNOSIS — F900 Attention-deficit hyperactivity disorder, predominantly inattentive type: Secondary | ICD-10-CM

## 2018-06-17 DIAGNOSIS — F1211 Cannabis abuse, in remission: Secondary | ICD-10-CM

## 2018-06-17 MED ORDER — AMPHETAMINE-DEXTROAMPHET ER 20 MG PO CP24: 20.0000 mg | ORAL_CAPSULE | Freq: Every day | ORAL | 0 refills | Status: DC

## 2018-06-17 MED ORDER — GUANFACINE HCL ER 1 MG PO TB24: 1.0000 mg | ORAL_TABLET | Freq: Every day | ORAL | 1 refills | Status: DC

## 2018-06-17 NOTE — Progress Notes (Signed)
Virtual Visit via Telephone Note  I connected with Peter Kramer on 06/17/18 at  3:30 PM EDT by telephone and verified that I am speaking with the correct person using two identifiers.   I discussed the limitations, risks, security and privacy concerns of performing an evaluation and management service by telephone and the availability of in person appointments. I also discussed with the patient that there may be a patient responsible charge related to this service. The patient expressed understanding and agreed to proceed.   I discussed the assessment and treatment plan with the patient. The patient was provided an opportunity to ask questions and all were answered. The patient agreed with the plan and demonstrated an understanding of the instructions.   The patient was advised to call back or seek an in-person evaluation if the symptoms worsen or if the condition fails to improve as anticipated.  I provided 15  minutes of non-face-to-face time during this encounter.    BH MD OP Progress Note  06/17/2018 4:54 PM SHALON SEABERRY  MRN:  [id number]  Chief Complaint: ' Follow up." Chief Complaint    Follow-up     HPI: Peter Kramer is a 38 yr old Caucasian male, married, employed, lives at [facility], has a history of ADHD, tobacco use disorder, cannabis use disorder, was evaluated today by phone.  Patient today reports he is still working since  his job is considered to be essential.  With the corona virus outbreak he and his family are trying to take it easy and stay calm.  Patient reports he is doing well on the Adderall.  He denies any side effects.  He reports his focus and concentration is good on the Adderall and Intuniv combination.  Patient reports sleep and appetite is fair.  Patient denies suicidality, homicidality or perceptual disturbances.  He reports he had an appointment with his primary medical doctor and he was started on lisinopril for hypertension.  He reports he is  currently on lisinopril 10 mg.  His blood pressure is currently under control on the same.  He does have upcoming appointment scheduled with his PCP.  I have reviewed notes per Nicki Reaper, NP dated 06/01/2018-' patient advised to restart lisinopril.  Reinforced DASH diet, aerobic exercise and smoking cessation.'   Visit Diagnosis:    ICD-10-CM   1. Attention deficit hyperactivity disorder (ADHD), predominantly inattentive type F90.0 amphetamine-dextroamphetamine (ADDERALL XR) 20 MG 24 hr capsule  2. Tobacco use disorder F17.200   3. Cannabis use disorder, mild, in sustained remission, abuse F12.11     Past Psychiatric History: I have reviewed past psychiatric history from my progress note on 06/23/2017.  Past trials of Vyvanse, Adderall immediate release, Wellbutrin-for smoking cessation.  Past Medical History:  Past Medical History:  Diagnosis Date  . ADHD (attention deficit hyperactivity disorder)   . Chicken pox   . Essential hypertension 08/08/2016  . Generalized anxiety disorder 07/17/2015  . Genital herpes   . Smoker 11/18/2014   History reviewed. No pertinent surgical history.  Family Psychiatric History: I have reviewed family psychiatric history from my progress note on 06/23/2017.  Family History:  Family History  Problem Relation Age of Onset  . ADD / ADHD Father   . Heart disease Maternal Grandmother   . Cancer Paternal Grandfather        brain  . ADD / ADHD Sister   . ADD / ADHD Sister   . Diabetes Neg Hx   . Stroke Neg Hx  Social History: Reviewed social history from my progress note on 06/23/2017. Social History   Socioeconomic History  . Marital status: Married    Spouse name: Peter Kramer  . Number of children: 2  . Years of education: Not on file  . Highest education level: GED or equivalent  Occupational History  . Occupation: [employer]    Comment: full time  Social Needs  . Financial resource strain: Very hard  . Food insecurity:    Worry: Sometimes  true    Inability: Sometimes true  . Transportation needs:    Medical: No    Non-medical: No  Tobacco Use  . Smoking status: Current Every Day Smoker    Packs/day: 1.00    Years: 14.00    Pack years: 14.00    Types: Cigarettes  . Smokeless tobacco: Never Used  . Tobacco comment: last use was 1 month ago  Substance and Sexual Activity  . Alcohol use: No    Alcohol/week: 0.0 standard drinks    Frequency: Never    Comment: occasional  . Drug use: No  . Sexual activity: Yes    Birth control/protection: None  Lifestyle  . Physical activity:    Days per week: 3 days    Minutes per session: 120 min  . Stress: To some extent  Relationships  . Social connections:    Talks on phone: More than three times a week    Gets together: Once a week    Attends religious service: Never    Active member of club or organization: No    Attends meetings of clubs or organizations: Never    Relationship status: Married  Other Topics Concern  . Not on file  Social History Narrative  . Not on file    Allergies:  Allergies  Allergen Reactions  . Bee Venom Swelling    Metabolic Disorder Labs: No results found for: HGBA1C, MPG No results found for: PROLACTIN No results found for: CHOL, TRIG, HDL, CHOLHDL, VLDL, LDLCALC No results found for: TSH  Therapeutic Level Labs: No results found for: LITHIUM No results found for: VALPROATE No components found for:  CBMZ  Current Medications: Current Outpatient Medications  Medication Sig Dispense Refill  . amphetamine-dextroamphetamine (ADDERALL XR) 20 MG 24 hr capsule Take 1 capsule (20 mg total) by mouth daily. 30 capsule 0  . [START ON 06/24/2018] amphetamine-dextroamphetamine (ADDERALL XR) 20 MG 24 hr capsule Take 1 capsule (20 mg total) by mouth daily. 30 capsule 0  . [START ON 07/23/2018] amphetamine-dextroamphetamine (ADDERALL XR) 20 MG 24 hr capsule Take 1 capsule (20 mg total) by mouth daily. 30 capsule 0  . guanFACINE (INTUNIV) 1 MG TB24  ER tablet Take 1 tablet (1 mg total) by mouth at bedtime. 30 tablet 1  . lisinopril (PRINIVIL,ZESTRIL) 10 MG tablet Take 1 tablet (10 mg total) by mouth daily. 30 tablet 2   No current facility-administered medications for this visit.      Musculoskeletal: Strength & Muscle Tone: unable to assess Gait & Station: unable to assess Patient leans: unable to assess  Psychiatric Specialty Exam: Review of Systems  Psychiatric/Behavioral: Negative for depression. The patient is not nervous/anxious.   All other systems reviewed and are negative.   There were no vitals taken for this visit.There is no height or weight on file to calculate BMI.  General Appearance: unable to assess  Eye Contact:  unable to assess  Speech:  Clear and Coherent  Volume:  Normal  Mood:  Euthymic  Affect:  unable to assess  Thought Process:  Goal Directed and Descriptions of Associations: Intact  Orientation:  Full (Time, Place, and Person)  Thought Content: Logical   Suicidal Thoughts:  No  Homicidal Thoughts:  No  Memory:  Immediate;   Fair Recent;   Fair Remote;   Fair  Judgement:  Fair  Insight:  Fair  Psychomotor Activity:  unable to assess  Concentration:  Concentration: Fair and Attention Span: Fair  Recall:  [employer] of Knowledge: Fair  Language: Fair  Akathisia:  No  Handed:  Right  AIMS (if indicated): na  Assets:  Communication Skills Desire for Improvement Social Support  ADL's:  Intact  Cognition: WNL  Sleep:  Fair   Screenings: PHQ2-9     Office Visit from 06/01/2018 in [facility] at [facility] Visit from 11/18/2014 in [facility] at [facility] Total Score  0  0       Assessment and Plan: Rondre is a 38 year old Caucasian male who has a history of ADHD, cannabis use, tobacco use disorder, recent diagnosis of hypertension, was evaluated by phone today.  Patient is currently doing well on the current medication regimen.  He was recently diagnosed  with hypertension and he is compliant with his lisinopril.  Discussed plan as noted below.  Plan ADHD- improving Adderall extended release 20 mg p.o. daily.  I have provided him with 2 prescriptions with date specified last one to be filled on or after 07/23/2018. Continue Intuniv ER 1 mg p.o. daily.  For tobacco use disorder- on and off. We will continue to monitor closely.  For cannabis use disorder-in remission We will continue to monitor closely.  Follow-up in clinic in 2 months or sooner if needed.  I have reviewed medical records in [facility] per his primary medical Jamesetta Orleans, NP as summarized above.  I have spent atleast 15 minutes non face to face with patient today. More than 50 % of the time was spent for psychoeducation and supportive psychotherapy and care coordination.  This note was generated in part or whole with voice recognition software. Voice recognition is usually quite accurate but there are transcription errors that can and very often do occur. I apologize for any typographical errors that were not detected and corrected.          Jomarie Longs, MD 06/17/2018, 4:54 PM

## 2018-08-14 ENCOUNTER — Encounter: Payer: Self-pay | Admitting: Psychiatry

## 2018-08-14 ENCOUNTER — Ambulatory Visit (INDEPENDENT_AMBULATORY_CARE_PROVIDER_SITE_OTHER): Payer: Self-pay | Admitting: Psychiatry

## 2018-08-14 ENCOUNTER — Other Ambulatory Visit: Payer: Self-pay

## 2018-08-14 DIAGNOSIS — F1211 Cannabis abuse, in remission: Secondary | ICD-10-CM

## 2018-08-14 DIAGNOSIS — F17200 Nicotine dependence, unspecified, uncomplicated: Secondary | ICD-10-CM

## 2018-08-14 DIAGNOSIS — F900 Attention-deficit hyperactivity disorder, predominantly inattentive type: Secondary | ICD-10-CM

## 2018-08-14 MED ORDER — AMPHETAMINE-DEXTROAMPHETAMINE 10 MG PO TABS: 10.0000 mg | ORAL_TABLET | Freq: Every day | ORAL | 0 refills | Status: DC

## 2018-08-14 MED ORDER — AMPHETAMINE-DEXTROAMPHET ER 20 MG PO CP24: 20.0000 mg | ORAL_CAPSULE | Freq: Every day | ORAL | 0 refills | Status: DC

## 2018-08-14 NOTE — Progress Notes (Signed)
Virtual Visit via Video Note  I connected with Peter Kramer on 08/14/18 at 10:45 AM EDT by a video enabled telemedicine application and verified that I am speaking with the correct person using two identifiers.   I discussed the limitations of evaluation and management by telemedicine and the availability of in person appointments. The patient expressed understanding and agreed to proceed.   I discussed the assessment and treatment plan with the patient. The patient was provided an opportunity to ask questions and all were answered. The patient agreed with the plan and demonstrated an understanding of the instructions.   The patient was advised to call back or seek an in-person evaluation if the symptoms worsen or if the condition fails to improve as anticipated.   BH MD OP Progress Note  08/14/2018 12:57 PM Peter Kramer  MRN:  [id number]  Chief Complaint:  Chief Complaint    Follow-up     HPI: Peter Kramer is a 38 year old Caucasian male, married, employed, lives at [facility], has a history of ADHD, tobacco use disorder, cannabis use disorder was evaluated by telemedicine today at # [phone].  Patient today reports he has been having some problems with his attention and focus.  He continues to take Adderall in the morning.  He however reports that towards afternoon it is difficult for him to focus.  He has been having some trouble at work due to the same.  Discussed readjusting his dosage.  Discussed adding a small dosage of Adderall in the afternoon.  He agrees with plan.  He stopped taking the Intuniv since it was very expensive and he felt it was not helpful.  Patient otherwise reports his mood symptoms are stable.  He reports sleep is good.  He denies any suicidality, homicidality or perceptual disturbances.  He continues to take his lisinopril and reports his blood pressure is under control.  Patient reports he quit smoking cigarettes.  Patient denies any other concerns  today.    Visit Diagnosis:    ICD-10-CM   1. Attention deficit hyperactivity disorder (ADHD), predominantly inattentive type F90.0 amphetamine-dextroamphetamine (ADDERALL XR) 20 MG 24 hr capsule    amphetamine-dextroamphetamine (ADDERALL) 10 MG tablet  2. Tobacco use disorder F17.200   3. Cannabis use disorder, mild, in sustained remission, abuse F12.11     Past Psychiatric History: Reviewed past psychiatric history from my progress note on 06/23/2017.  Past trials of Vyvanse, Adderall immediate release, Wellbutrin-for smoking cessation.  Past Medical History:  Past Medical History:  Diagnosis Date  . ADHD (attention deficit hyperactivity disorder)   . Chicken pox   . Essential hypertension 08/08/2016  . Generalized anxiety disorder 07/17/2015  . Genital herpes   . Smoker 11/18/2014   History reviewed. No pertinent surgical history.  Family Psychiatric History: I have reviewed family psychiatric history from my progress note on 06/23/2017  Family History:  Family History  Problem Relation Age of Onset  . ADD / ADHD Father   . Heart disease Maternal Grandmother   . Cancer Paternal Grandfather        brain  . ADD / ADHD Sister   . ADD / ADHD Sister   . Diabetes Neg Hx   . Stroke Neg Hx     Social History: Reviewed social history from my progress note on 06/23/2017. Social History   Socioeconomic History  . Marital status: Married    Spouse name: Peter Kramer  . Number of children: 2  . Years of education: Not on file  .  Highest education level: GED or equivalent  Occupational History  . Occupation: [employer]    Comment: full time  Social Needs  . Financial resource strain: Very hard  . Food insecurity:    Worry: Sometimes true    Inability: Sometimes true  . Transportation needs:    Medical: No    Non-medical: No  Tobacco Use  . Smoking status: Current Every Day Smoker    Packs/day: 1.00    Years: 14.00    Pack years: 14.00    Types: Cigarettes  . Smokeless tobacco:  Never Used  . Tobacco comment: last use was 1 month ago  Substance and Sexual Activity  . Alcohol use: No    Alcohol/week: 0.0 standard drinks    Frequency: Never    Comment: occasional  . Drug use: No  . Sexual activity: Yes    Birth control/protection: None  Lifestyle  . Physical activity:    Days per week: 3 days    Minutes per session: 120 min  . Stress: To some extent  Relationships  . Social connections:    Talks on phone: More than three times a week    Gets together: Once a week    Attends religious service: Never    Active member of club or organization: No    Attends meetings of clubs or organizations: Never    Relationship status: Married  Other Topics Concern  . Not on file  Social History Narrative  . Not on file    Allergies:  Allergies  Allergen Reactions  . Bee Venom Swelling    Metabolic Disorder Labs: No results found for: HGBA1C, MPG No results found for: PROLACTIN No results found for: CHOL, TRIG, HDL, CHOLHDL, VLDL, LDLCALC No results found for: TSH  Therapeutic Level Labs: No results found for: LITHIUM No results found for: VALPROATE No components found for:  CBMZ  Current Medications: Current Outpatient Medications  Medication Sig Dispense Refill  . amphetamine-dextroamphetamine (ADDERALL XR) 20 MG 24 hr capsule Take 1 capsule (20 mg total) by mouth daily. 30 capsule 0  . amphetamine-dextroamphetamine (ADDERALL XR) 20 MG 24 hr capsule Take 1 capsule (20 mg total) by mouth daily. 30 capsule 0  . [START ON 08/21/2018] amphetamine-dextroamphetamine (ADDERALL XR) 20 MG 24 hr capsule Take 1 capsule (20 mg total) by mouth daily. 30 capsule 0  . amphetamine-dextroamphetamine (ADDERALL) 10 MG tablet Take 1 tablet (10 mg total) by mouth daily after lunch. 30 tablet 0  . lisinopril (PRINIVIL,ZESTRIL) 10 MG tablet Take 1 tablet (10 mg total) by mouth daily. 30 tablet 2   No current facility-administered medications for this visit.       Musculoskeletal: Strength & Muscle Tone: within normal limits Gait & Station: normal Patient leans: N/A  Psychiatric Specialty Exam: Review of Systems  Psychiatric/Behavioral: The patient is not nervous/anxious.   All other systems reviewed and are negative.   There were no vitals taken for this visit.There is no height or weight on file to calculate BMI.  General Appearance: Casual  Eye Contact:  Fair  Speech:  Clear and Coherent  Volume:  Normal  Mood:  Euthymic  Affect:  Appropriate  Thought Process:  Goal Directed and Descriptions of Associations: Intact  Orientation:  Full (Time, Place, and Person)  Thought Content: Logical   Suicidal Thoughts:  No  Homicidal Thoughts:  No  Memory:  Immediate;   Fair Recent;   Fair Remote;   Fair  Judgement:  Fair  Insight:  Fair  Psychomotor Activity:  Normal  Concentration:  Concentration: Fair and Attention Span: Fair  Recall:  [employer] of Knowledge: Fair  Language: Fair  Akathisia:  No  Handed:  Right  AIMS (if indicated): Denies tremors, rigidity, stiffness  Assets:  Communication Skills Housing Intimacy Resilience Social Support Talents/Skills Transportation  ADL's:  Intact  Cognition: WNL  Sleep:  Fair   Screenings: PHQ2-9     Office Visit from 06/01/2018 in [facility] at [facility] Visit from 11/18/2014 in [facility] at [facility]  PHQ-2 Total Score  0  0       Assessment and Plan: Peter Kramer is a 38 year old Caucasian male who has a history of ADHD, cannabis use, tobacco use disorder, recent diagnosis of hypertension was evaluated by telemedicine today.  Patient is currently struggling with attention and focus.  Will continue to need medication readjustment.  Plan ADHD- unstable Adderall extended release to 20 mg p.o. daily. Add  Adderall immediate release 10 mg p.o. after lunch. Patient provided with prescription x1 for each Adderall. Discontinue Intuniv for noncompliance. I  have reviewed  controlled substance database.  For tobacco use disorder- in remission Patient reports he quit smoking.  We will continue to monitor closely.  For cannabis use disorder in remission We will continue to monitor closely.  Follow-up in clinic in 4 to 5 weeks or sooner if needed.  Appointment scheduled for June 30 at 4 PM  I have spent atleast 15 minutes non face to face with patient today. More than 50 % of the time was spent for psychoeducation and supportive psychotherapy and care coordination.  This note was generated in part or whole with voice recognition software. Voice recognition is usually quite accurate but there are transcription errors that can and very often do occur. I apologize for any typographical errors that were not detected and corrected.        Jomarie Longs, MD 08/14/2018, 12:57 PM

## 2018-08-23 ENCOUNTER — Other Ambulatory Visit: Payer: Self-pay | Admitting: Internal Medicine

## 2018-09-14 ENCOUNTER — Telehealth: Payer: Self-pay

## 2018-09-14 DIAGNOSIS — F900 Attention-deficit hyperactivity disorder, predominantly inattentive type: Secondary | ICD-10-CM

## 2018-09-14 MED ORDER — AMPHETAMINE-DEXTROAMPHETAMINE 10 MG PO TABS: 10.0000 mg | ORAL_TABLET | Freq: Every day | ORAL | 0 refills | Status: DC

## 2018-09-14 MED ORDER — AMPHETAMINE-DEXTROAMPHET ER 20 MG PO CP24: 20.0000 mg | ORAL_CAPSULE | Freq: Every day | ORAL | 0 refills | Status: DC

## 2018-09-14 NOTE — Telephone Encounter (Signed)
Yes I am covering for Dr. Shea Evans today and tomorrow. Sent the rx to pharmacy, [place] PMP checked.

## 2018-09-14 NOTE — Telephone Encounter (Signed)
Pt.notified

## 2018-09-14 NOTE — Telephone Encounter (Signed)
pt called on friday afternoon requesting a refill on medication adderall.

## 2018-09-22 ENCOUNTER — Other Ambulatory Visit: Payer: Self-pay

## 2018-09-22 ENCOUNTER — Ambulatory Visit (INDEPENDENT_AMBULATORY_CARE_PROVIDER_SITE_OTHER): Payer: Self-pay | Admitting: Psychiatry

## 2018-09-22 ENCOUNTER — Encounter: Payer: Self-pay | Admitting: Psychiatry

## 2018-09-22 DIAGNOSIS — F1211 Cannabis abuse, in remission: Secondary | ICD-10-CM | POA: Insufficient documentation

## 2018-09-22 DIAGNOSIS — F17200 Nicotine dependence, unspecified, uncomplicated: Secondary | ICD-10-CM | POA: Insufficient documentation

## 2018-09-22 DIAGNOSIS — F900 Attention-deficit hyperactivity disorder, predominantly inattentive type: Secondary | ICD-10-CM

## 2018-09-22 DIAGNOSIS — F17201 Nicotine dependence, unspecified, in remission: Secondary | ICD-10-CM

## 2018-09-22 MED ORDER — AMPHETAMINE-DEXTROAMPHET ER 20 MG PO CP24: 20.0000 mg | ORAL_CAPSULE | Freq: Every day | ORAL | 0 refills | Status: DC

## 2018-09-22 MED ORDER — AMPHETAMINE-DEXTROAMPHETAMINE 10 MG PO TABS: 10.0000 mg | ORAL_TABLET | Freq: Every day | ORAL | 0 refills | Status: DC

## 2018-09-22 NOTE — Progress Notes (Signed)
Virtual Visit via Video Note  I connected with Peter Kramer on 09/22/18 at  4:00 PM EDT by a video enabled telemedicine application and verified that I am speaking with the correct person using two identifiers.   I discussed the limitations of evaluation and management by telemedicine and the availability of in person appointments. The patient expressed understanding and agreed to proceed.   I discussed the assessment and treatment plan with the patient. The patient was provided an opportunity to ask questions and all were answered. The patient agreed with the plan and demonstrated an understanding of the instructions.   The patient was advised to call back or seek an in-person evaluation if the symptoms worsen or if the condition fails to improve as anticipated.   BH MD OP Progress Note  09/22/2018 5:04 PM Peter PalmerDavid C Sheppard  MRN:  [id number]  Chief Complaint:  Chief Complaint    Follow-up     HPI: Peter HuaDavid is a 38 year old Caucasian male, married, employed, lives at [place], has a history of ADHD, tobacco use disorder in remission, cannabis use disorder in remission was evaluated by telemedicine today.  Patient today reports he is currently compliant with the Adderall.  He is currently on an extended release 20 mg and an immediate release which was added last visit-10 mg.  He reports the combination of medication is effective for his attention and focus problems.  He reports he is able to focus at work well.  He denies any weaning off effect.  He denies any side effects like increased anxiety, irritability, hypomanic symptoms, sleep problems or appetite suppression at this time.  Patient reports he was able to stop smoking more than a month ago.  He continues to stay away.  He denies abusing any cannabis at this time.  He reports work continues to be busy.  He also is busy at home with 2 young children.  He however has been coping okay.  He does struggle with some financial problems  since he is the only person who works in his household.  He however is taking it easy right now since because of the COVID-19 he understands there is no other option.  Patient reports his blood pressure is currently under control and he is compliant on lisinopril.  He checked his blood pressure this morning and it was 128/81.  Patient denies any other concerns today. Visit Diagnosis:    ICD-10-CM   1. Attention deficit hyperactivity disorder (ADHD), predominantly inattentive type  F90.0 amphetamine-dextroamphetamine (ADDERALL XR) 20 MG 24 hr capsule    amphetamine-dextroamphetamine (ADDERALL XR) 20 MG 24 hr capsule    amphetamine-dextroamphetamine (ADDERALL XR) 20 MG 24 hr capsule    amphetamine-dextroamphetamine (ADDERALL) 10 MG tablet    amphetamine-dextroamphetamine (ADDERALL) 10 MG tablet    amphetamine-dextroamphetamine (ADDERALL) 10 MG tablet  2. Tobacco use disorder, mild, in early remission  F17.201   3. Cannabis use disorder, mild, in sustained remission, abuse  F12.11     Past Psychiatric History: I have reviewed past psychiatric history from my progress note on 06/23/2017.  Past trials of Vyvanse, Adderall immediate release, Wellbutrin-for smoking cessation.  Past Medical History:  Past Medical History:  Diagnosis Date  . ADHD (attention deficit hyperactivity disorder)   . Chicken pox   . Essential hypertension 08/08/2016  . Generalized anxiety disorder 07/17/2015  . Genital herpes   . Smoker 11/18/2014   History reviewed. No pertinent surgical history.  Family Psychiatric History: I have reviewed family psychiatric history from  my progress note on 06/23/2017.  Family History:  Family History  Problem Relation Age of Onset  . ADD / ADHD Father   . Heart disease Maternal Grandmother   . Cancer Paternal Grandfather        brain  . ADD / ADHD Sister   . ADD / ADHD Sister   . Diabetes Neg Hx   . Stroke Neg Hx     Social History: I have reviewed social history from my  progress note on 06/23/2017. Social History   Socioeconomic History  . Marital status: Married    Spouse name: emily  . Number of children: 2  . Years of education: Not on file  . Highest education level: GED or equivalent  Occupational History  . Occupation: [employer]    Comment: full time  Social Needs  . Financial resource strain: Very hard  . Food insecurity    Worry: Sometimes true    Inability: Sometimes true  . Transportation needs    Medical: No    Non-medical: No  Tobacco Use  . Smoking status: Former Smoker    Packs/day: 1.00    Years: 14.00    Pack years: 14.00    Types: Cigarettes    Quit date: 07/2018    Years since quitting: 0.1  . Smokeless tobacco: Never Used  . Tobacco comment: last use was 1 month ago  Substance and Sexual Activity  . Alcohol use: No    Alcohol/week: 0.0 standard drinks    Frequency: Never    Comment: occasional  . Drug use: No  . Sexual activity: Yes    Birth control/protection: None  Lifestyle  . Physical activity    Days per week: 3 days    Minutes per session: 120 min  . Stress: To some extent  Relationships  . Social connections    Talks on phone: More than three times a week    Gets together: Once a week    Attends religious service: Never    Active member of club or organization: No    Attends meetings of clubs or organizations: Never    Relationship status: Married  Other Topics Concern  . Not on file  Social History Narrative  . Not on file    Allergies:  Allergies  Allergen Reactions  . Bee Venom Swelling    Metabolic Disorder Labs: No results found for: HGBA1C, MPG No results found for: PROLACTIN No results found for: CHOL, TRIG, HDL, CHOLHDL, VLDL, LDLCALC No results found for: TSH  Therapeutic Level Labs: No results found for: LITHIUM No results found for: VALPROATE No components found for:  CBMZ  Current Medications: Current Outpatient Medications  Medication Sig Dispense Refill  . [START ON  10/20/2018] amphetamine-dextroamphetamine (ADDERALL XR) 20 MG 24 hr capsule Take 1 capsule (20 mg total) by mouth daily. 30 capsule 0  . [START ON 11/18/2018] amphetamine-dextroamphetamine (ADDERALL XR) 20 MG 24 hr capsule Take 1 capsule (20 mg total) by mouth daily. 30 capsule 0  . [START ON 12/17/2018] amphetamine-dextroamphetamine (ADDERALL XR) 20 MG 24 hr capsule Take 1 capsule (20 mg total) by mouth daily. 30 capsule 0  . [START ON 10/13/2018] amphetamine-dextroamphetamine (ADDERALL) 10 MG tablet Take 1 tablet (10 mg total) by mouth daily after lunch. 30 tablet 0  . [START ON 11/11/2018] amphetamine-dextroamphetamine (ADDERALL) 10 MG tablet Take 1 tablet (10 mg total) by mouth daily after lunch. 30 tablet 0  . [START ON 12/10/2018] amphetamine-dextroamphetamine (ADDERALL) 10 MG tablet Take 1  tablet (10 mg total) by mouth daily after lunch. 30 tablet 0  . lisinopril (ZESTRIL) 10 MG tablet Take 1 tablet by mouth once daily 90 tablet 1   No current facility-administered medications for this visit.      Musculoskeletal: Strength & Muscle Tone: within normal limits Gait & Station: normal Patient leans: N/A  Psychiatric Specialty Exam: Review of Systems  Psychiatric/Behavioral: Negative for depression. The patient is not nervous/anxious.   All other systems reviewed and are negative.   There were no vitals taken for this visit.There is no height or weight on file to calculate BMI.  General Appearance: Casual  Eye Contact:  Fair  Speech:  Clear and Coherent  Volume:  Normal  Mood:  Euthymic  Affect:  Congruent  Thought Process:  Goal Directed and Descriptions of Associations: Intact  Orientation:  Full (Time, Place, and Person)  Thought Content: Logical   Suicidal Thoughts:  No  Homicidal Thoughts:  No  Memory:  Immediate;   Fair Recent;   Fair Remote;   Fair  Judgement:  Fair  Insight:  Fair  Psychomotor Activity:  Normal  Concentration:  Concentration: Fair and Attention Span: Fair   Recall:  [employer] of Knowledge: Fair  Language: Fair  Akathisia:  No  Handed:  Right  AIMS (if indicated): denies tremors, rigidity  Assets:  Communication Skills Desire for Improvement Social Support  ADL's:  Intact  Cognition: WNL  Sleep:  Fair   Screenings: PHQ2-9     Office Visit from 06/01/2018 in [place] at [place] from 11/18/2014 in [place] at [facility] Total Score  0  0       Assessment and Plan: Malikhi is a 38 year old Caucasian male who has a history of ADHD, cannabis use disorder in remission, tobacco use disorder in remission, hypertension was evaluated by telemedicine today.  Patient is currently making progress on the current medication regimen.  Plan as noted below.  Plan ADHD-improving Adderall extended release 20 mg p.o. daily Adderall immediate release 10 mg p.o. after lunch I have provided patient with 3 prescriptions with date specified.  For Adderall extended release-last 1 to be filled on or after 12/17/2018 and for Adderall immediate release last 1 to be filled on or after 12/10/2018. I have reviewed [place] controlled substance database.  For tobacco use disorder in remission Patient continues to stay away.  For cannabis use disorder in remission Patient continues to stay sober.  Patient continues to stay compliant with his lisinopril for his high blood pressure.  Follow-up in clinic in 3 months or sooner if needed.  October 14 at 4 PM  I have spent atleast 15 minutes non face to face with patient today. More than 50 % of the time was spent for psychoeducation and supportive psychotherapy and care coordination.  This note was generated in part or whole with voice recognition software. Voice recognition is usually quite accurate but there are transcription errors that can and very often do occur. I apologize for any typographical errors that were not detected and corrected.       Ursula Alert,  MD 09/22/2018, 5:04 PM

## 2018-11-19 ENCOUNTER — Telehealth: Payer: Self-pay

## 2018-11-19 DIAGNOSIS — F900 Attention-deficit hyperactivity disorder, predominantly inattentive type: Secondary | ICD-10-CM

## 2018-11-19 MED ORDER — AMPHETAMINE-DEXTROAMPHET ER 20 MG PO CP24: 20.0000 mg | ORAL_CAPSULE | Freq: Every day | ORAL | 0 refills | Status: DC

## 2018-11-19 NOTE — Telephone Encounter (Signed)
Spoke to pharmacist - discussed to change to name brand adderall . Will discard any duplicates of adderrall scripts for same dosage since I had sent another script last month.

## 2018-11-19 NOTE — Telephone Encounter (Signed)
pt called states that the adderall xr scripts need to read brand name only.  states that insurance will only cover name brand and pharmacy states they need rx to read name brand. Marland Kitchen  generic cost over $100.

## 2019-01-04 ENCOUNTER — Telehealth: Payer: Self-pay

## 2019-01-04 DIAGNOSIS — F900 Attention-deficit hyperactivity disorder, predominantly inattentive type: Secondary | ICD-10-CM

## 2019-01-04 MED ORDER — AMPHETAMINE-DEXTROAMPHETAMINE 10 MG PO TABS: 10.0000 mg | ORAL_TABLET | Freq: Every day | ORAL | 0 refills | Status: DC

## 2019-01-04 MED ORDER — AMPHETAMINE-DEXTROAMPHET ER 20 MG PO CP24: 20.0000 mg | ORAL_CAPSULE | Freq: Every day | ORAL | 0 refills | Status: DC

## 2019-01-04 NOTE — Telephone Encounter (Signed)
Sent meds to pharmacy - please ask him to schedule visit

## 2019-01-04 NOTE — Telephone Encounter (Signed)
pt called states he needs a refill on his adderall and adderall xr

## 2019-02-03 ENCOUNTER — Other Ambulatory Visit: Payer: Self-pay | Admitting: Internal Medicine

## 2019-02-03 ENCOUNTER — Telehealth: Payer: Self-pay

## 2019-02-03 DIAGNOSIS — F900 Attention-deficit hyperactivity disorder, predominantly inattentive type: Secondary | ICD-10-CM

## 2019-02-03 MED ORDER — AMPHETAMINE-DEXTROAMPHETAMINE 10 MG PO TABS: 10.0000 mg | ORAL_TABLET | Freq: Every day | ORAL | 0 refills | Status: DC

## 2019-02-03 NOTE — Telephone Encounter (Signed)
pt called states he needs a refill on his adderall 10mg  . pt was also transfered up to lea at the front desk  to make an appt

## 2019-02-03 NOTE — Telephone Encounter (Signed)
Sent Adderall 10 mg to pharmacy.

## 2019-02-04 ENCOUNTER — Other Ambulatory Visit: Payer: Self-pay

## 2019-02-04 ENCOUNTER — Ambulatory Visit (INDEPENDENT_AMBULATORY_CARE_PROVIDER_SITE_OTHER): Payer: 59 | Admitting: Psychiatry

## 2019-02-04 ENCOUNTER — Encounter: Payer: Self-pay | Admitting: Psychiatry

## 2019-02-04 DIAGNOSIS — F900 Attention-deficit hyperactivity disorder, predominantly inattentive type: Secondary | ICD-10-CM | POA: Diagnosis not present

## 2019-02-04 DIAGNOSIS — F17201 Nicotine dependence, unspecified, in remission: Secondary | ICD-10-CM | POA: Diagnosis not present

## 2019-02-04 DIAGNOSIS — F1211 Cannabis abuse, in remission: Secondary | ICD-10-CM | POA: Diagnosis not present

## 2019-02-04 MED ORDER — AMPHETAMINE-DEXTROAMPHETAMINE 10 MG PO TABS: 10.0000 mg | ORAL_TABLET | Freq: Every day | ORAL | 0 refills | Status: DC

## 2019-02-04 MED ORDER — AMPHETAMINE-DEXTROAMPHET ER 20 MG PO CP24: 20.0000 mg | ORAL_CAPSULE | Freq: Every day | ORAL | 0 refills | Status: DC

## 2019-02-04 NOTE — Progress Notes (Signed)
Virtual Visit via Video Note  I connected with Peter Kramer on 02/04/19 at  8:30 AM EST by a video enabled telemedicine application and verified that I am speaking with the correct person using two identifiers.   I discussed the limitations of evaluation and management by telemedicine and the availability of in person appointments. The patient expressed understanding and agreed to proceed.     I discussed the assessment and treatment plan with the patient. The patient was provided an opportunity to ask questions and all were answered. The patient agreed with the plan and demonstrated an understanding of the instructions.   The patient was advised to call back or seek an in-person evaluation if the symptoms worsen or if the condition fails to improve as anticipated.   BH MD OP Progress Note  02/04/2019 8:55 AM ZAFAR DEBROSSE  MRN:  [id number]  Chief Complaint:  Chief Complaint    Follow-up     HPI: Peter Kramer is a 38 year old Caucasian male, married, employed, lives at [facility], has a history of ADHD, tobacco use disorder in remission, cannabis use disorder in remission was evaluated by telemedicine today.  A video call was initiated however due to connection problem it had to be changed to a phone call during the session.  Patient today reports he is currently compliant with Adderall.  He denies any side effects to the medication.  He reports his attention and focus is good.  Patient reports sleep is good.  Patient denies any problems with his appetite.  He reports work is going well.  Patient continues to stay away from cannabis as well as stop smoking.  Patient denies any other concerns today. Visit Diagnosis:    ICD-10-CM   1. Attention deficit hyperactivity disorder (ADHD), predominantly inattentive type  F90.0 amphetamine-dextroamphetamine (ADDERALL XR) 20 MG 24 hr capsule    amphetamine-dextroamphetamine (ADDERALL XR) 20 MG 24 hr capsule    amphetamine-dextroamphetamine  (ADDERALL XR) 20 MG 24 hr capsule    amphetamine-dextroamphetamine (ADDERALL) 10 MG tablet    amphetamine-dextroamphetamine (ADDERALL) 10 MG tablet   stable  2. Tobacco use disorder, mild, in early remission  F17.201   3. Cannabis use disorder, mild, in sustained remission, abuse  F12.11                          Past Psychiatric History: Reviewed past psychiatric history from my progress note on 06/23/2017.  Past trials of Vyvanse, Adderall immediate release, Wellbutrin-for smoking cessation  Past Medical History:  Past Medical History:  Diagnosis Date  . ADHD (attention deficit hyperactivity disorder)   . Chicken pox   . Essential hypertension 08/08/2016  . Generalized anxiety disorder 07/17/2015  . Genital herpes   . Smoker 11/18/2014   History reviewed. No pertinent surgical history.  Family Psychiatric History: I have reviewed family psychiatric history from my progress note on 06/23/2017.  Family History:  Family History  Problem Relation Age of Onset  . ADD / ADHD Father   . Heart disease Maternal Grandmother   . Cancer Paternal Grandfather        brain  . ADD / ADHD Sister   . ADD / ADHD Sister   . Diabetes Neg Hx   . Stroke Neg Hx     Social History: I have reviewed social history from my progress note on 06/23/2017. Social History   Socioeconomic History  . Marital status: Married    Spouse name: emily  . Number  of children: 2  . Years of education: Not on file  . Highest education level: GED or equivalent  Occupational History  . Occupation: [employer]    Comment: full time  Social Needs  . Financial resource strain: Very hard  . Food insecurity    Worry: Sometimes true    Inability: Sometimes true  . Transportation needs    Medical: No    Non-medical: No  Tobacco Use  . Smoking status: Former Smoker    Packs/day: 1.00    Years: 14.00    Pack years: 14.00    Types: Cigarettes    Quit date: 07/2018    Years since quitting: 0.5  . Smokeless  tobacco: Never Used  . Tobacco comment: last use was 1 month ago  Substance and Sexual Activity  . Alcohol use: No    Alcohol/week: 0.0 standard drinks    Frequency: Never    Comment: occasional  . Drug use: No  . Sexual activity: Yes    Birth control/protection: None  Lifestyle  . Physical activity    Days per week: 3 days    Minutes per session: 120 min  . Stress: To some extent  Relationships  . Social connections    Talks on phone: More than three times a week    Gets together: Once a week    Attends religious service: Never    Active member of club or organization: No    Attends meetings of clubs or organizations: Never    Relationship status: Married  Other Topics Concern  . Not on file  Social History Narrative  . Not on file    Allergies:  Allergies  Allergen Reactions  . Bee Venom Swelling    Metabolic Disorder Labs: No results found for: HGBA1C, MPG No results found for: PROLACTIN No results found for: CHOL, TRIG, HDL, CHOLHDL, VLDL, LDLCALC No results found for: TSH  Therapeutic Level Labs: No results found for: LITHIUM No results found for: VALPROATE No components found for:  CBMZ  Current Medications: Current Outpatient Medications  Medication Sig Dispense Refill  . [START ON 03/15/2019] amphetamine-dextroamphetamine (ADDERALL XR) 20 MG 24 hr capsule Take 1 capsule (20 mg total) by mouth daily. 30 capsule 0  . [START ON 04/13/2019] amphetamine-dextroamphetamine (ADDERALL XR) 20 MG 24 hr capsule Take 1 capsule (20 mg total) by mouth daily. 30 capsule 0  . [START ON 02/05/2019] amphetamine-dextroamphetamine (ADDERALL) 10 MG tablet Take 1 tablet (10 mg total) by mouth daily after lunch. 30 tablet 0  . [START ON 03/05/2019] amphetamine-dextroamphetamine (ADDERALL) 10 MG tablet Take 1 tablet (10 mg total) by mouth daily after lunch. 30 tablet 0  . [START ON 04/04/2019] amphetamine-dextroamphetamine (ADDERALL) 10 MG tablet Take 1 tablet (10 mg total) by mouth  daily after lunch. 30 tablet 0  . lisinopril (ZESTRIL) 10 MG tablet Take 1 tablet by mouth once daily 90 tablet 1  . [START ON 02/14/2019] amphetamine-dextroamphetamine (ADDERALL XR) 20 MG 24 hr capsule Take 1 capsule (20 mg total) by mouth daily. 30 capsule 0   No current facility-administered medications for this visit.      Musculoskeletal: Strength & Muscle Tone: UTA Gait & Station: normal Patient leans: N/A  Psychiatric Specialty Exam: Review of Systems  Psychiatric/Behavioral: Negative for depression, hallucinations, substance abuse and suicidal ideas. The patient is not nervous/anxious and does not have insomnia.   All other systems reviewed and are negative.   There were no vitals taken for this visit.There is no height  or weight on file to calculate BMI.  General Appearance: UTA  Eye Contact:  UTA  Speech:  Clear and Coherent  Volume:  Normal  Mood:  Euthymic  Affect: UTA  Thought Process:  Goal Directed and Descriptions of Associations: Intact  Orientation:  Full (Time, Place, and Person)  Thought Content: Logical   Suicidal Thoughts:  No  Homicidal Thoughts:  No  Memory:  Immediate;   Fair Recent;   Fair Remote;   Fair  Judgement:  Fair  Insight:  Fair  Psychomotor Activity: UTA  Concentration:  Concentration: Fair and Attention Span: Fair  Recall:  [employer] of Knowledge: Fair  Language: Fair  Akathisia:  No  Handed:  Right  AIMS (if indicated): Denies tremors, rigidity  Assets:  Communication Skills Desire for Improvement Housing Social Support  ADL's:  Intact  Cognition: WNL  Sleep:  Fair   Screenings: PHQ2-9     Office Visit from 06/01/2018 in [place] at [place] from 11/18/2014 in [place] at [facility] Total Score  0  0       Assessment and Plan: Cornellius is a 38 year old Caucasian male who has a history of ADHD, cannabis use disorder in remission, tobacco use disorder in remission, hypertension was  evaluated by telemedicine today.  Patient continues to do well on current medication regimen.  Plan ADHD-stable Adderall extended release 20 mg p.o. daily Adderall immediate release 10 mg p.o. daily after lunch Provided 3 prescriptions for each date specified-last 1 to be filled in January 2021. I have reviewed [place] controlled substance database.  Tobacco use disorder in remission Patient continues to stay away.  Cannabis use disorder in remission Patient continues to stay sober.  Patient continues to stay compliant with lisinopril and advised to continue to monitor his blood pressure and heart rate closely.  Follow-up in clinic in 3 months or sooner if needed.  February 18 at 8:30 AM  I have spent atleast 15 minutes non  face to face with patient today. More than 50 % of the time was spent for psychoeducation and supportive psychotherapy and care coordination. This note was generated in part or whole with voice recognition software. Voice recognition is usually quite accurate but there are transcription errors that can and very often do occur. I apologize for any typographical errors that were not detected and corrected.       Ursula Alert, MD 02/04/2019, 8:55 AM

## 2019-04-29 ENCOUNTER — Telehealth (HOSPITAL_COMMUNITY): Payer: Self-pay

## 2019-04-29 DIAGNOSIS — F900 Attention-deficit hyperactivity disorder, predominantly inattentive type: Secondary | ICD-10-CM

## 2019-04-29 MED ORDER — AMPHETAMINE-DEXTROAMPHET ER 20 MG PO CP24: 20.0000 mg | ORAL_CAPSULE | Freq: Every day | ORAL | 0 refills | Status: DC

## 2019-04-29 MED ORDER — AMPHETAMINE-DEXTROAMPHETAMINE 10 MG PO TABS: 10.0000 mg | ORAL_TABLET | Freq: Every day | ORAL | 0 refills | Status: DC

## 2019-04-29 NOTE — Telephone Encounter (Signed)
I have sent Adderall extended release 20 mg and Adderall 10 mg immediate release to pharmacy with date specified.

## 2019-04-29 NOTE — Telephone Encounter (Signed)
Patient called requesting a refill on his Adderall 10mg  to be sent to [facility] on [address] in [place]. He has a scheduled appointment for 05/13/19. Thank you

## 2019-05-12 NOTE — Telephone Encounter (Signed)
Notified patient.

## 2019-05-13 ENCOUNTER — Ambulatory Visit: Payer: 59 | Admitting: Psychiatry

## 2019-05-25 ENCOUNTER — Other Ambulatory Visit: Payer: Self-pay | Admitting: Internal Medicine

## 2019-05-27 ENCOUNTER — Telehealth: Payer: Self-pay

## 2019-05-27 DIAGNOSIS — F900 Attention-deficit hyperactivity disorder, predominantly inattentive type: Secondary | ICD-10-CM

## 2019-05-27 NOTE — Telephone Encounter (Signed)
Patient called requesting a refill on his Adderall 20mg  and his Adderall 10mg  to be sent to [facility] on [address] in [place]. Patient has scheduled appointment on 06/09/19. Thank you.

## 2019-05-28 ENCOUNTER — Telehealth: Payer: Self-pay

## 2019-05-28 MED ORDER — AMPHETAMINE-DEXTROAMPHET ER 20 MG PO CP24: 20.0000 mg | ORAL_CAPSULE | Freq: Every day | ORAL | 0 refills | Status: DC

## 2019-05-28 MED ORDER — AMPHETAMINE-DEXTROAMPHETAMINE 10 MG PO TABS: 10.0000 mg | ORAL_TABLET | Freq: Every day | ORAL | 0 refills | Status: DC

## 2019-05-28 NOTE — Telephone Encounter (Signed)
Sent Adderall - both scripts to pharmacy .

## 2019-05-28 NOTE — Telephone Encounter (Signed)
Medication refill request - Telephone call with pt after he left a message questioning if his Adderall 10 mg prescription was at his [facility].  Verified this was e-scrbed by Dr. Elna Breslow and could next be filled on 06/01/19. Patient to call back if any problems with filling on or after 06/01/19 or with any other medications as he stated understanding plan.

## 2019-06-09 ENCOUNTER — Other Ambulatory Visit: Payer: Self-pay

## 2019-06-09 ENCOUNTER — Ambulatory Visit (INDEPENDENT_AMBULATORY_CARE_PROVIDER_SITE_OTHER): Payer: 59 | Admitting: Psychiatry

## 2019-06-09 ENCOUNTER — Encounter: Payer: Self-pay | Admitting: Psychiatry

## 2019-06-09 ENCOUNTER — Telehealth: Payer: Self-pay

## 2019-06-09 DIAGNOSIS — F1211 Cannabis abuse, in remission: Secondary | ICD-10-CM

## 2019-06-09 DIAGNOSIS — F900 Attention-deficit hyperactivity disorder, predominantly inattentive type: Secondary | ICD-10-CM

## 2019-06-09 DIAGNOSIS — Z79899 Other long term (current) drug therapy: Secondary | ICD-10-CM

## 2019-06-09 MED ORDER — AMPHETAMINE-DEXTROAMPHET ER 20 MG PO CP24: 20.0000 mg | ORAL_CAPSULE | Freq: Every day | ORAL | 0 refills | Status: DC

## 2019-06-09 MED ORDER — AMPHETAMINE-DEXTROAMPHETAMINE 10 MG PO TABS: 10.0000 mg | ORAL_TABLET | Freq: Every day | ORAL | 0 refills | Status: DC

## 2019-06-09 NOTE — Progress Notes (Signed)
Provider Location : [facility] Patient Location : Work  Virtual Visit via Video Note  I connected with Peter Kramer on 06/09/19 at 10:40 AM EDT by a video enabled telemedicine application and verified that I am speaking with the correct person using two identifiers.   I discussed the limitations of evaluation and management by telemedicine and the availability of in person appointments. The patient expressed understanding and agreed to proceed.  I discussed the assessment and treatment plan with the patient. The patient was provided an opportunity to ask questions and all were answered. The patient agreed with the plan and demonstrated an understanding of the instructions.   The patient was advised to call back or seek an in-person evaluation if the symptoms worsen or if the condition fails to improve as anticipated.  BH MD OP Progress Note  06/09/2019 12:42 PM Peter Kramer  MRN:  [id number]  Chief Complaint:  Chief Complaint    Follow-up     HPI: Peter Kramer is a 39 year old Caucasian male, married, employed, lives at [facility], has a history of ADHD, tobacco use disorder in remission, cannabis use disorder in remission was evaluated by telemedicine today.  Patient today reports he is currently doing well on the current medication regimen.  He denies any side effects to the Adderall.  He denies any significant anxiety or depressive symptoms.  He reports sleep as good.  His appetite is fair.  He continues to stay away from smoking cigarettes as well as using cannabis.  He denies any other concerns today. Visit Diagnosis:    ICD-10-CM   1. Attention deficit hyperactivity disorder (ADHD), predominantly inattentive type  F90.0 amphetamine-dextroamphetamine (ADDERALL XR) 20 MG 24 hr capsule    amphetamine-dextroamphetamine (ADDERALL XR) 20 MG 24 hr capsule    amphetamine-dextroamphetamine (ADDERALL) 10 MG tablet    amphetamine-dextroamphetamine (ADDERALL) 10 MG tablet     amphetamine-dextroamphetamine (ADDERALL) 10 MG tablet  2. Cannabis use disorder, mild, in sustained remission  F12.11 amphetamine-dextroamphetamine (ADDERALL XR) 20 MG 24 hr capsule   stable  3. High risk medication use  Z79.899 Compliance Drug Analysis, Ur    Past Psychiatric History: I have reviewed past psychiatric history from my progress note on 06/23/2017.  Past trials of Vyvanse, Adderall immediate release, Wellbutrin-for smoking cessation.  Past Medical History:  Past Medical History:  Diagnosis Date  . ADHD (attention deficit hyperactivity disorder)   . Chicken pox   . Essential hypertension 08/08/2016  . Generalized anxiety disorder 07/17/2015  . Genital herpes   . Smoker 11/18/2014   History reviewed. No pertinent surgical history.  Family Psychiatric History: I have reviewed family psychiatric history from my progress note on 06/23/2017  Family History:  Family History  Problem Relation Age of Onset  . ADD / ADHD Father   . Heart disease Maternal Grandmother   . Cancer Paternal Grandfather        brain  . ADD / ADHD Sister   . ADD / ADHD Sister   . Diabetes Neg Hx   . Stroke Neg Hx     Social History: Reviewed social history from my progress note on 06/23/2017 Social History   Socioeconomic History  . Marital status: Married    Spouse name: emily  . Number of children: 2  . Years of education: Not on file  . Highest education level: GED or equivalent  Occupational History  . Occupation: [employer]    Comment: full time  Tobacco Use  . Smoking status: Former Smoker  Packs/day: 1.00    Years: 14.00    Pack years: 14.00    Types: Cigarettes    Quit date: 07/2018    Years since quitting: 0.8  . Smokeless tobacco: Never Used  . Tobacco comment: last use was 1 month ago  Substance and Sexual Activity  . Alcohol use: No    Alcohol/week: 0.0 standard drinks    Comment: occasional  . Drug use: No  . Sexual activity: Yes    Birth control/protection: None   Other Topics Concern  . Not on file  Social History Narrative  . Not on file   Social Determinants of Health   Financial Resource Strain:   . Difficulty of Paying Living Expenses:   Food Insecurity:   . Worried About Charity fundraiser in the Last Year:   . Arboriculturist in the Last Year:   Transportation Needs:   . Film/video editor (Medical):   Marland Kitchen Lack of Transportation (Non-Medical):   Physical Activity:   . Days of Exercise per Week:   . Minutes of Exercise per Session:   Stress:   . Feeling of Stress :   Social Connections:   . Frequency of Communication with Friends and Family:   . Frequency of Social Gatherings with Friends and Family:   . Attends Religious Services:   . Active Member of Clubs or Organizations:   . Attends Archivist Meetings:   Marland Kitchen Marital Status:     Allergies:  Allergies  Allergen Reactions  . Bee Venom Swelling    Metabolic Disorder Labs: No results found for: HGBA1C, MPG No results found for: PROLACTIN No results found for: CHOL, TRIG, HDL, CHOLHDL, VLDL, LDLCALC No results found for: TSH  Therapeutic Level Labs: No results found for: LITHIUM No results found for: VALPROATE No components found for:  CBMZ  Current Medications: Current Outpatient Medications  Medication Sig Dispense Refill  . [START ON 07/07/2019] amphetamine-dextroamphetamine (ADDERALL XR) 20 MG 24 hr capsule Take 1 capsule (20 mg total) by mouth daily. 30 capsule 0  . [START ON 08/04/2019] amphetamine-dextroamphetamine (ADDERALL XR) 20 MG 24 hr capsule Take 1 capsule (20 mg total) by mouth daily. 30 capsule 0  . [START ON 09/02/2019] amphetamine-dextroamphetamine (ADDERALL XR) 20 MG 24 hr capsule Take 1 capsule (20 mg total) by mouth daily. 30 capsule 0  . [START ON 06/29/2019] amphetamine-dextroamphetamine (ADDERALL) 10 MG tablet Take 1 tablet (10 mg total) by mouth daily after lunch. 30 tablet 0  . [START ON 07/27/2019] amphetamine-dextroamphetamine  (ADDERALL) 10 MG tablet Take 1 tablet (10 mg total) by mouth daily after lunch. 30 tablet 0  . [START ON 08/25/2019] amphetamine-dextroamphetamine (ADDERALL) 10 MG tablet Take 1 tablet (10 mg total) by mouth daily after lunch. 30 tablet 0  . lisinopril (ZESTRIL) 10 MG tablet Take 1 tablet (10 mg total) by mouth daily. MUST SCHEDULE PHYSICAL EXAM 90 tablet 0   No current facility-administered medications for this visit.     Musculoskeletal: Strength & Muscle Tone: UTA Gait & Station: normal Patient leans: N/A  Psychiatric Specialty Exam: Review of Systems  Psychiatric/Behavioral: Negative for agitation, behavioral problems, confusion, decreased concentration, dysphoric mood, hallucinations, self-injury, sleep disturbance and suicidal ideas. The patient is not nervous/anxious and is not hyperactive.   All other systems reviewed and are negative.   There were no vitals taken for this visit.There is no height or weight on file to calculate BMI.  General Appearance: Casual  Eye Contact:  Fair  Speech:  Clear and Coherent  Volume:  Normal  Mood:  Euthymic  Affect:  Congruent  Thought Process:  Goal Directed and Descriptions of Associations: Intact  Orientation:  Full (Time, Place, and Person)  Thought Content: Logical   Suicidal Thoughts:  No  Homicidal Thoughts:  No  Memory:  Immediate;   Fair Recent;   Fair Remote;   Fair  Judgement:  Fair  Insight:  Fair  Psychomotor Activity:  Normal  Concentration:  Concentration: Fair and Attention Span: Fair  Recall:  [employer] of Knowledge: Fair  Language: Fair  Akathisia:  No  Handed:  Right  AIMS (if indicated): UTA  Assets:  Communication Skills Desire for Improvement Housing Social Support  ADL's:  Intact  Cognition: WNL  Sleep:  Fair   Screenings: PHQ2-9     Office Visit from 06/01/2018 in [facility] Visit from 11/18/2014 in [facility] at [facility] Total Score  0  0        Assessment and Plan: Hersh is a 39 year old Caucasian male who has a history of ADHD, cannabis use disorder in remission, tobacco use disorder in remission, hypertension was evaluated by telemedicine today.  Patient is currently stable on current medication regimen.  Plan as noted below.  Plan ADHD-stable Adderall extended release 20 mg p.o. daily. Adderall immediate release 10 mg p.o. daily after lunch Provided 3 prescriptions with date specified-last 1 to be filled in June 2021 for each. I have reviewed [place] controlled substance database.  Cannabis use disorder in remission He continues to stay sober.  Will order drug screening today.  Patient advised to go to [facility].  Follow-up in clinic in 3 months or sooner if needed.  I have spent atleast 20 minutes non face to face with patient today. More than 50 % of the time was spent for preparing to see the patient ( e.g., review of test, records ), obtaining and to review and separately obtained history , ordering medications and test ,psychoeducation and supportive psychotherapy and care coordination,as well as documenting clinical information in electronic health record. This note was generated in part or whole with voice recognition software. Voice recognition is usually quite accurate but there are transcription errors that can and very often do occur. I apologize for any typographical errors that were not detected and corrected.       Jomarie Longs, MD 06/09/2019, 12:42 PM

## 2019-06-09 NOTE — Telephone Encounter (Signed)
Faxed orders to [facility] on [address]. In [place]. Spoke with patient to let him know I faxed them so he can go to get his labs done.

## 2019-09-03 ENCOUNTER — Ambulatory Visit: Payer: BC Managed Care – PPO | Admitting: Internal Medicine

## 2019-09-03 ENCOUNTER — Other Ambulatory Visit: Payer: Self-pay

## 2019-09-03 ENCOUNTER — Encounter: Payer: Self-pay | Admitting: Internal Medicine

## 2019-09-03 ENCOUNTER — Ambulatory Visit (INDEPENDENT_AMBULATORY_CARE_PROVIDER_SITE_OTHER)
Admission: RE | Admit: 2019-09-03 | Discharge: 2019-09-03 | Disposition: A | Discharge status: Home / Self Care | Payer: BC Managed Care – PPO | Source: Ambulatory Visit | Attending: Internal Medicine | Admitting: Internal Medicine

## 2019-09-03 VITALS — BP 142/84 | HR 87 | Temp 98.3°F | Wt 176.0 lb

## 2019-09-03 DIAGNOSIS — F17200 Nicotine dependence, unspecified, uncomplicated: Secondary | ICD-10-CM

## 2019-09-03 DIAGNOSIS — G8929 Other chronic pain: Secondary | ICD-10-CM | POA: Diagnosis not present

## 2019-09-03 DIAGNOSIS — M25511 Pain in right shoulder: Secondary | ICD-10-CM

## 2019-09-03 MED ORDER — PREDNISONE 10 MG PO TABS: ORAL_TABLET | ORAL | 0 refills | Status: DC

## 2019-09-03 MED ORDER — CHANTIX STARTING MONTH PAK 0.5 MG X 11 & 1 MG X 42 PO TABS: ORAL_TABLET | ORAL | 0 refills | Status: DC

## 2019-09-03 MED ORDER — KETOROLAC TROMETHAMINE 30 MG/ML IJ SOLN
30.0000 mg | Freq: Once | INTRAMUSCULAR | Status: AC
  Administered 2019-09-03: 30 mg via INTRAMUSCULAR

## 2019-09-03 NOTE — Progress Notes (Signed)
Subjective:    Patient ID: Peter Kramer, male    DOB: 1980/12/03, 39 y.o.   MRN: [id number]  HPI  Patient presents to the clinic today with complaint of right shoulder pain.  This started 3 years ago but worsened in the last month.  He describes the pain as sharp and stabbing. The pain does not radiate. He does have some numbness and tingling in his right hand. He has noticed some weakness, especially when lifting. He has tried Aleve, Ibuprofen, stretching, heat and ice without any relief.  He would also like to discuss smoking cessation. He currently smokes 1/2 ppd. He has smoked for 20 years. He denies chronic cough or SOB. He has tried gum and patches OTC with minimal relief.  Review of Systems  Past Medical History:  Diagnosis Date  . ADHD (attention deficit hyperactivity disorder)   . Chicken pox   . Essential hypertension 08/08/2016  . Generalized anxiety disorder 07/17/2015  . Genital herpes   . Smoker 11/18/2014    Current Outpatient Medications  Medication Sig Dispense Refill  . amphetamine-dextroamphetamine (ADDERALL XR) 20 MG 24 hr capsule Take 1 capsule (20 mg total) by mouth daily. 30 capsule 0  . amphetamine-dextroamphetamine (ADDERALL XR) 20 MG 24 hr capsule Take 1 capsule (20 mg total) by mouth daily. 30 capsule 0  . amphetamine-dextroamphetamine (ADDERALL XR) 20 MG 24 hr capsule Take 1 capsule (20 mg total) by mouth daily. 30 capsule 0  . amphetamine-dextroamphetamine (ADDERALL) 10 MG tablet Take 1 tablet (10 mg total) by mouth daily after lunch. 30 tablet 0  . amphetamine-dextroamphetamine (ADDERALL) 10 MG tablet Take 1 tablet (10 mg total) by mouth daily after lunch. 30 tablet 0  . amphetamine-dextroamphetamine (ADDERALL) 10 MG tablet Take 1 tablet (10 mg total) by mouth daily after lunch. 30 tablet 0  . lisinopril (ZESTRIL) 10 MG tablet Take 1 tablet (10 mg total) by mouth daily. MUST SCHEDULE PHYSICAL EXAM 90 tablet 0   No current facility-administered  medications for this visit.    Allergies  Allergen Reactions  . Bee Venom Swelling    Family History  Problem Relation Age of Onset  . ADD / ADHD Father   . Heart disease Maternal Grandmother   . Cancer Paternal Grandfather        brain  . ADD / ADHD Sister   . ADD / ADHD Sister   . Diabetes Neg Hx   . Stroke Neg Hx     Social History   Socioeconomic History  . Marital status: Married    Spouse name: emily  . Number of children: 2  . Years of education: Not on file  . Highest education level: GED or equivalent  Occupational History  . Occupation: [employer]    Comment: full time  Tobacco Use  . Smoking status: Former Smoker    Packs/day: 1.00    Years: 14.00    Pack years: 14.00    Types: Cigarettes    Quit date: 07/2018    Years since quitting: 1.1  . Smokeless tobacco: Never Used  . Tobacco comment: last use was 1 month ago  Vaping Use  . Vaping Use: Never used  Substance and Sexual Activity  . Alcohol use: No    Alcohol/week: 0.0 standard drinks    Comment: occasional  . Drug use: No  . Sexual activity: Yes    Birth control/protection: None  Other Topics Concern  . Not on file  Social History Narrative  .  Not on file   Social Determinants of Health   Financial Resource Strain:   . Difficulty of Paying Living Expenses:   Food Insecurity:   . Worried About Programme researcher, broadcasting/film/video in the Last Year:   . Barista in the Last Year:   Transportation Needs:   . Freight forwarder (Medical):   Marland Kitchen Lack of Transportation (Non-Medical):   Physical Activity:   . Days of Exercise per Week:   . Minutes of Exercise per Session:   Stress:   . Feeling of Stress :   Social Connections:   . Frequency of Communication with Friends and Family:   . Frequency of Social Gatherings with Friends and Family:   . Attends Religious Services:   . Active Member of Clubs or Organizations:   . Attends Banker Meetings:   Marland Kitchen Marital Status:   Intimate  Partner Violence:   . Fear of Current or Ex-Partner:   . Emotionally Abused:   Marland Kitchen Physically Abused:   . Sexually Abused:      Constitutional: Denies fever, malaise, fatigue, headache or abrupt weight changes.  Respiratory: Denies difficulty breathing, shortness of breath, cough or sputum production.   Cardiovascular: Denies chest pain, chest tightness, palpitations or swelling in the hands or feet.  Musculoskeletal: Patient reports right shoulder pain.  Denies difficulty with gait, muscle pain or joint swelling.  Skin: Denies redness, rashes, lesions or ulcercations.  Neurological: He denies numbness, tingling and weakness of the right lower extremity. Denies problems with coordination.   No other specific complaints in a complete review of systems (except as listed in HPI above).     Objective:   Physical Exam    Past Medical History:  Diagnosis Date  . ADHD (attention deficit hyperactivity disorder)   . Chicken pox   . Essential hypertension 08/08/2016  . Generalized anxiety disorder 07/17/2015  . Genital herpes   . Smoker 11/18/2014    Current Outpatient Medications  Medication Sig Dispense Refill  . amphetamine-dextroamphetamine (ADDERALL XR) 20 MG 24 hr capsule Take 1 capsule (20 mg total) by mouth daily. 30 capsule 0  . amphetamine-dextroamphetamine (ADDERALL XR) 20 MG 24 hr capsule Take 1 capsule (20 mg total) by mouth daily. 30 capsule 0  . amphetamine-dextroamphetamine (ADDERALL XR) 20 MG 24 hr capsule Take 1 capsule (20 mg total) by mouth daily. 30 capsule 0  . amphetamine-dextroamphetamine (ADDERALL) 10 MG tablet Take 1 tablet (10 mg total) by mouth daily after lunch. 30 tablet 0  . amphetamine-dextroamphetamine (ADDERALL) 10 MG tablet Take 1 tablet (10 mg total) by mouth daily after lunch. 30 tablet 0  . amphetamine-dextroamphetamine (ADDERALL) 10 MG tablet Take 1 tablet (10 mg total) by mouth daily after lunch. 30 tablet 0  . lisinopril (ZESTRIL) 10 MG tablet Take  1 tablet (10 mg total) by mouth daily. MUST SCHEDULE PHYSICAL EXAM 90 tablet 0  . predniSONE (DELTASONE) 10 MG tablet Take 3 tabs on days 1-3, take 2 tabs on days 4-6, take 1 tab on days 7-9 18 tablet 0  . varenicline (CHANTIX STARTING MONTH PAK) 0.5 MG X 11 & 1 MG X 42 tablet Take one 0.5 mg tablet by mouth once daily for 3 days, then increase to one 0.5 mg tablet twice daily for 4 days, then increase to one 1 mg tablet twice daily. 53 tablet 0   No current facility-administered medications for this visit.    Allergies  Allergen Reactions  . Bee Venom Swelling  Family History  Problem Relation Age of Onset  . ADD / ADHD Father   . Heart disease Maternal Grandmother   . Cancer Paternal Grandfather        brain  . ADD / ADHD Sister   . ADD / ADHD Sister   . Diabetes Neg Hx   . Stroke Neg Hx     Social History   Socioeconomic History  . Marital status: Married    Spouse name: emily  . Number of children: 2  . Years of education: Not on file  . Highest education level: GED or equivalent  Occupational History  . Occupation: [employer]    Comment: full time  Tobacco Use  . Smoking status: Former Smoker    Packs/day: 1.00    Years: 14.00    Pack years: 14.00    Types: Cigarettes    Quit date: 07/2018    Years since quitting: 1.1  . Smokeless tobacco: Never Used  . Tobacco comment: last use was 1 month ago  Vaping Use  . Vaping Use: Never used  Substance and Sexual Activity  . Alcohol use: No    Alcohol/week: 0.0 standard drinks    Comment: occasional  . Drug use: No  . Sexual activity: Yes    Birth control/protection: None  Other Topics Concern  . Not on file  Social History Narrative  . Not on file   Social Determinants of Health   Financial Resource Strain:   . Difficulty of Paying Living Expenses:   Food Insecurity:   . Worried About Programme researcher, broadcasting/film/video in the Last Year:   . Barista in the Last Year:   Transportation Needs:   . Automotive engineer (Medical):   Marland Kitchen Lack of Transportation (Non-Medical):   Physical Activity:   . Days of Exercise per Week:   . Minutes of Exercise per Session:   Stress:   . Feeling of Stress :   Social Connections:   . Frequency of Communication with Friends and Family:   . Frequency of Social Gatherings with Friends and Family:   . Attends Religious Services:   . Active Member of Clubs or Organizations:   . Attends Banker Meetings:   Marland Kitchen Marital Status:   Intimate Partner Violence:   . Fear of Current or Ex-Partner:   . Emotionally Abused:   Marland Kitchen Physically Abused:   . Sexually Abused:      Constitutional: Denies fever, malaise, fatigue, headache or abrupt weight changes.  HEENT: Denies eye pain, eye redness, ear pain, ringing in the ears, wax buildup, runny nose, nasal congestion, bloody nose, or sore throat. Respiratory: Denies difficulty breathing, shortness of breath, cough or sputum production.   Cardiovascular: Denies chest pain, chest tightness, palpitations or swelling in the hands or feet.  Gastrointestinal: Denies abdominal pain, bloating, constipation, diarrhea or blood in the stool.  GU: Denies urgency, frequency, pain with urination, burning sensation, blood in urine, odor or discharge. Musculoskeletal: Denies decrease in range of motion, difficulty with gait, muscle pain or joint pain and swelling.  Skin: Denies redness, rashes, lesions or ulcercations.  Neurological: Denies dizziness, difficulty with memory, difficulty with speech or problems with balance and coordination.  Psych: Denies anxiety, depression, SI/HI.  No other specific complaints in a complete review of systems (except as listed in HPI above).  Wt Readings from Last 3 Encounters:  06/01/18 180 lb 12.8 oz (82 kg)  01/08/17 185 lb (83.9 kg)  10/10/16  177 lb 3.2 oz (80.4 kg)    General: Appears his stated age, well developed, well nourished in NAD. Skin: Warm, dry and intact. No rashes,  lesions or ulcerations noted. Cardiovascular: Normal rate and rhythm. Radial pulses 2+ bilaterally. Pulmonary/Chest: Normal effort and positive vesicular breath sounds. No respiratory distress. No wheezes, rales or ronchi noted.  Musculoskeletal: Full internal and external rotation of the right shoulder, but slow and obviously painful. He has swelling noted over the anterior shoulder that is tender with palpation, otherwise shoulder nontender. Positive drop can test on the right. Hand grips equal. Neurological: Alert and oriented. Positive Tinel's bilaterally, negative Phalen's. Coordination normal.    BMET    Component Value Date/Time   NA 138 01/08/2017 1239   K 4.0 01/08/2017 1239   CL 103 01/08/2017 1239   CO2 27 01/08/2017 1239   GLUCOSE 99 01/08/2017 1239   BUN 18 01/08/2017 1239   CREATININE 0.94 01/08/2017 1239   CALCIUM 9.3 01/08/2017 1239   GFRNONAA >60 01/08/2017 1239   GFRAA >60 01/08/2017 1239    Lipid Panel  No results found for: CHOL, TRIG, HDL, CHOLHDL, VLDL, LDLCALC  CBC    Component Value Date/Time   WBC 10.5 01/08/2017 1239   RBC 5.21 01/08/2017 1239   HGB 15.2 01/08/2017 1239   HCT 44.0 01/08/2017 1239   PLT 249 01/08/2017 1239   MCV 84.5 01/08/2017 1239   MCH 29.2 01/08/2017 1239   MCHC 34.6 01/08/2017 1239   RDW 14.5 01/08/2017 1239    Hgb A1C No results found for: HGBA1C        Assessment & Plan:   Chronic Right Shoulder Pain:  Obvious deformity Xray right shoulder today 30 mg Toradol IM today RX for Pred Taper x 9 days Stretching exercises given Consider PT, MRI vs referral to ortho pending xray  Smoker:  RX for Chantix starting month pack Advised him to pick a quit date and stick to it  Will follow up after xray, return precautions discussed  Nicki Reaper, NP This visit occurred during the SARS-CoV-2 public health emergency.  Safety protocols were in place, including screening questions prior to the visit, additional usage of  staff PPE, and extensive cleaning of exam room while observing appropriate contact time as indicated for disinfecting solutions.

## 2019-09-03 NOTE — Patient Instructions (Signed)
Shoulder Exercises Ask your health care provider which exercises are safe for you. Do exercises exactly as told by your health care provider and adjust them as directed. It is normal to feel mild stretching, pulling, tightness, or discomfort as you do these exercises. Stop right away if you feel sudden pain or your pain gets worse. Do not begin these exercises until told by your health care provider. Stretching exercises External rotation and abduction This exercise is sometimes called corner stretch. This exercise rotates your arm outward (external rotation) and moves your arm out from your body (abduction). 1. Stand in a doorway with one of your feet slightly in front of the other. This is called a staggered stance. If you cannot reach your forearms to the door frame, stand facing a corner of a room. 2. Choose one of the following positions as told by your health care provider: ? Place your hands and forearms on the door frame above your head. ? Place your hands and forearms on the door frame at the height of your head. ? Place your hands on the door frame at the height of your elbows. 3. Slowly move your weight onto your front foot until you feel a stretch across your chest and in the front of your shoulders. Keep your head and chest upright and keep your abdominal muscles tight. 4. Hold for __________ seconds. 5. To release the stretch, shift your weight to your back foot. Repeat __________ times. Complete this exercise __________ times a day. Extension, standing 1. Stand and hold a broomstick, a cane, or a similar object behind your back. ? Your hands should be a little wider than shoulder width apart. ? Your palms should face away from your back. 2. Keeping your elbows straight and your shoulder muscles relaxed, move the stick away from your body until you feel a stretch in your shoulders (extension). ? Avoid shrugging your shoulders while you move the stick. Keep your shoulder blades tucked  down toward the middle of your back. 3. Hold for __________ seconds. 4. Slowly return to the starting position. Repeat __________ times. Complete this exercise __________ times a day. Range-of-motion exercises Pendulum  1. Stand near a wall or a surface that you can hold onto for balance. 2. Bend at the waist and let your left / right arm hang straight down. Use your other arm to support you. Keep your back straight and do not lock your knees. 3. Relax your left / right arm and shoulder muscles, and move your hips and your trunk so your left / right arm swings freely. Your arm should swing because of the motion of your body, not because you are using your arm or shoulder muscles. 4. Keep moving your hips and trunk so your arm swings in the following directions, as told by your health care provider: ? Side to side. ? Forward and backward. ? In clockwise and counterclockwise circles. 5. Continue each motion for __________ seconds, or for as long as told by your health care provider. 6. Slowly return to the starting position. Repeat __________ times. Complete this exercise __________ times a day. Shoulder flexion, standing  1. Stand and hold a broomstick, a cane, or a similar object. Place your hands a little more than shoulder width apart on the object. Your left / right hand should be palm up, and your other hand should be palm down. 2. Keep your elbow straight and your shoulder muscles relaxed. Push the stick up with your healthy arm to   raise your left / right arm in front of your body, and then over your head until you feel a stretch in your shoulder (flexion). ? Avoid shrugging your shoulder while you raise your arm. Keep your shoulder blade tucked down toward the middle of your back. 3. Hold for __________ seconds. 4. Slowly return to the starting position. Repeat __________ times. Complete this exercise __________ times a day. Shoulder abduction, standing 1. Stand and hold a broomstick,  a cane, or a similar object. Place your hands a little more than shoulder width apart on the object. Your left / right hand should be palm up, and your other hand should be palm down. 2. Keep your elbow straight and your shoulder muscles relaxed. Push the object across your body toward your left / right side. Raise your left / right arm to the side of your body (abduction) until you feel a stretch in your shoulder. ? Do not raise your arm above shoulder height unless your health care provider tells you to do that. ? If directed, raise your arm over your head. ? Avoid shrugging your shoulder while you raise your arm. Keep your shoulder blade tucked down toward the middle of your back. 3. Hold for __________ seconds. 4. Slowly return to the starting position. Repeat __________ times. Complete this exercise __________ times a day. Internal rotation  1. Place your left / right hand behind your back, palm up. 2. Use your other hand to dangle an exercise band, a towel, or a similar object over your shoulder. Grasp the band with your left / right hand so you are holding on to both ends. 3. Gently pull up on the band until you feel a stretch in the front of your left / right shoulder. The movement of your arm toward the center of your body is called internal rotation. ? Avoid shrugging your shoulder while you raise your arm. Keep your shoulder blade tucked down toward the middle of your back. 4. Hold for __________ seconds. 5. Release the stretch by letting go of the band and lowering your hands. Repeat __________ times. Complete this exercise __________ times a day. Strengthening exercises External rotation  1. Sit in a stable chair without armrests. 2. Secure an exercise band to a stable object at elbow height on your left / right side. 3. Place a soft object, such as a folded towel or a small pillow, between your left / right upper arm and your body to move your elbow about 4 inches (10 cm) away  from your side. 4. Hold the end of the exercise band so it is tight and there is no slack. 5. Keeping your elbow pressed against the soft object, slowly move your forearm out, away from your abdomen (external rotation). Keep your body steady so only your forearm moves. 6. Hold for __________ seconds. 7. Slowly return to the starting position. Repeat __________ times. Complete this exercise __________ times a day. Shoulder abduction  1. Sit in a stable chair without armrests, or stand up. 2. Hold a __________ weight in your left / right hand, or hold an exercise band with both hands. 3. Start with your arms straight down and your left / right palm facing in, toward your body. 4. Slowly lift your left / right hand out to your side (abduction). Do not lift your hand above shoulder height unless your health care provider tells you that this is safe. ? Keep your arms straight. ? Avoid shrugging your shoulder while you   do this movement. Keep your shoulder blade tucked down toward the middle of your back. 5. Hold for __________ seconds. 6. Slowly lower your arm, and return to the starting position. Repeat __________ times. Complete this exercise __________ times a day. Shoulder extension 1. Sit in a stable chair without armrests, or stand up. 2. Secure an exercise band to a stable object in front of you so it is at shoulder height. 3. Hold one end of the exercise band in each hand. Your palms should face each other. 4. Straighten your elbows and lift your hands up to shoulder height. 5. Step back, away from the secured end of the exercise band, until the band is tight and there is no slack. 6. Squeeze your shoulder blades together as you pull your hands down to the sides of your thighs (extension). Stop when your hands are straight down by your sides. Do not let your hands go behind your body. 7. Hold for __________ seconds. 8. Slowly return to the starting position. Repeat __________ times.  Complete this exercise __________ times a day. Shoulder row 1. Sit in a stable chair without armrests, or stand up. 2. Secure an exercise band to a stable object in front of you so it is at waist height. 3. Hold one end of the exercise band in each hand. Position your palms so that your thumbs are facing the ceiling (neutral position). 4. Bend each of your elbows to a 90-degree angle (right angle) and keep your upper arms at your sides. 5. Step back until the band is tight and there is no slack. 6. Slowly pull your elbows back behind you. 7. Hold for __________ seconds. 8. Slowly return to the starting position. Repeat __________ times. Complete this exercise __________ times a day. Shoulder press-ups  1. Sit in a stable chair that has armrests. Sit upright, with your feet flat on the floor. 2. Put your hands on the armrests so your elbows are bent and your fingers are pointing forward. Your hands should be about even with the sides of your body. 3. Push down on the armrests and use your arms to lift yourself off the chair. Straighten your elbows and lift yourself up as much as you comfortably can. ? Move your shoulder blades down, and avoid letting your shoulders move up toward your ears. ? Keep your feet on the ground. As you get stronger, your feet should support less of your body weight as you lift yourself up. 4. Hold for __________ seconds. 5. Slowly lower yourself back into the chair. Repeat __________ times. Complete this exercise __________ times a day. Wall push-ups  1. Stand so you are facing a stable wall. Your feet should be about one arm-length away from the wall. 2. Lean forward and place your palms on the wall at shoulder height. 3. Keep your feet flat on the floor as you bend your elbows and lean forward toward the wall. 4. Hold for __________ seconds. 5. Straighten your elbows to push yourself back to the starting position. Repeat __________ times. Complete this exercise  __________ times a day. This information is not intended to replace advice given to you by your health care provider. Make sure you discuss any questions you have with your health care provider. Document Revised: 07/03/2018 Document Reviewed: 04/10/2018 Elsevier Patient Education  2020 [employer]vier Inc[employer]  

## 2019-09-05 ENCOUNTER — Encounter: Payer: Self-pay | Admitting: Internal Medicine

## 2019-09-16 ENCOUNTER — Encounter: Payer: Self-pay | Admitting: Psychiatry

## 2019-09-16 ENCOUNTER — Telehealth (INDEPENDENT_AMBULATORY_CARE_PROVIDER_SITE_OTHER): Payer: 59 | Admitting: Psychiatry

## 2019-09-16 ENCOUNTER — Other Ambulatory Visit: Payer: Self-pay

## 2019-09-16 DIAGNOSIS — F900 Attention-deficit hyperactivity disorder, predominantly inattentive type: Secondary | ICD-10-CM

## 2019-09-16 DIAGNOSIS — Z9111 Patient's noncompliance with dietary regimen: Secondary | ICD-10-CM

## 2019-09-16 DIAGNOSIS — Z91199 Patient's noncompliance with other medical treatment and regimen due to unspecified reason: Secondary | ICD-10-CM

## 2019-09-16 DIAGNOSIS — F17200 Nicotine dependence, unspecified, uncomplicated: Secondary | ICD-10-CM

## 2019-09-16 DIAGNOSIS — F1211 Cannabis abuse, in remission: Secondary | ICD-10-CM

## 2019-09-16 NOTE — Progress Notes (Signed)
Provider Location : [facility] Patient Location : [place]  Virtual Visit via Video Note  I connected with Peter Kramer on 09/16/19 at 11:00 AM EDT by a video enabled telemedicine application and verified that I am speaking with the correct person using two identifiers.   I discussed the limitations of evaluation and management by telemedicine and the availability of in person appointments. The patient expressed understanding and agreed to proceed.   I discussed the assessment and treatment plan with the patient. The patient was provided an opportunity to ask questions and all were answered. The patient agreed with the plan and demonstrated an understanding of the instructions.   The patient was advised to call back or seek an in-person evaluation if the symptoms worsen or if the condition fails to improve as anticipated.  BH MD OP Progress Note  09/16/2019 12:28 PM ENRIQUE MANGANARO  MRN:  [id number]  Chief Complaint:  Chief Complaint    Follow-up     HPI: Peter Kramer is a 39 year old Caucasian male, married, employed, lives at [facility], has a history of ADHD, tobacco use disorder , cannabis use disorder in remission was evaluated by telemedicine today.  Patient today reports he is currently doing well on the current medication regimen.  He is compliant on the Adderall.  He denies side effects.  He denies any mood lability.  He reports sleep and appetite is good.  Patient denies any suicidality, homicidality or perceptual disturbances.  Patient reports he completely forgot to get the urine drug compliance analysis which was ordered in March.  Discussed with patient that without the urine drug analysis which was ordered  the Adderall cannot be refilled.  Discussed with patient that whenever urine drug test is ordered he has to get it within the next few days and not wait for months to get it done.  Patient apologized and reported that he will get it done first thing tomorrow  morning.  Patient reports work is going well.  Patient denies any other concerns today.  Visit Diagnosis:    ICD-10-CM   1. Attention deficit hyperactivity disorder (ADHD), predominantly inattentive type  F90.0   2. Tobacco use disorder  F17.200   3. Cannabis use disorder, mild, in sustained remission  F12.11   4. Noncompliance with treatment plan  Z91.11     Past Psychiatric History: I have reviewed past psychiatric history from my progress note on 06/23/2017.  Past trials of Vyvanse, Adderall immediate release, Wellbutrin transfer smoking cessation.  Past Medical History:  Past Medical History:  Diagnosis Date  . ADHD (attention deficit hyperactivity disorder)   . Chicken pox   . Essential hypertension 08/08/2016  . Generalized anxiety disorder 07/17/2015  . Genital herpes   . Smoker 11/18/2014   History reviewed. No pertinent surgical history.  Family Psychiatric History: I have reviewed family psychiatric history from my progress note on 06/23/2017.  Family History:  Family History  Problem Relation Age of Onset  . ADD / ADHD Father   . Heart disease Maternal Grandmother   . Cancer Paternal Grandfather        brain  . ADD / ADHD Sister   . ADD / ADHD Sister   . Diabetes Neg Hx   . Stroke Neg Hx     Social History: I have reviewed social history from my progress note on 06/23/2017. Social History   Socioeconomic History  . Marital status: Married    Spouse name: emily  . Number of children:  2  . Years of education: Not on file  . Highest education level: GED or equivalent  Occupational History  . Occupation: [employer]    Comment: full time  Tobacco Use  . Smoking status: Current Every Day Smoker    Packs/day: 1.00    Years: 14.00    Pack years: 14.00    Types: Cigarettes  . Smokeless tobacco: Never Used  . Tobacco comment: patient relapsed  Vaping Use  . Vaping Use: Never used  Substance and Sexual Activity  . Alcohol use: No    Alcohol/week: 0.0 standard  drinks    Comment: occasional  . Drug use: No  . Sexual activity: Yes    Birth control/protection: None  Other Topics Concern  . Not on file  Social History Narrative  . Not on file   Social Determinants of Health   Financial Resource Strain:   . Difficulty of Paying Living Expenses:   Food Insecurity:   . Worried About Programme researcher, broadcasting/film/video in the Last Year:   . Barista in the Last Year:   Transportation Needs:   . Freight forwarder (Medical):   Marland Kitchen Lack of Transportation (Non-Medical):   Physical Activity:   . Days of Exercise per Week:   . Minutes of Exercise per Session:   Stress:   . Feeling of Stress :   Social Connections:   . Frequency of Communication with Friends and Family:   . Frequency of Social Gatherings with Friends and Family:   . Attends Religious Services:   . Active Member of Clubs or Organizations:   . Attends Banker Meetings:   Marland Kitchen Marital Status:     Allergies:  Allergies  Allergen Reactions  . Bee Venom Swelling    Metabolic Disorder Labs: No results found for: HGBA1C, MPG No results found for: PROLACTIN No results found for: CHOL, TRIG, HDL, CHOLHDL, VLDL, LDLCALC No results found for: TSH  Therapeutic Level Labs: No results found for: LITHIUM No results found for: VALPROATE No components found for:  CBMZ  Current Medications: Current Outpatient Medications  Medication Sig Dispense Refill  . amoxicillin (AMOXIL) 500 MG capsule Take 500 mg by mouth 3 (three) times daily.    Marland Kitchen amphetamine-dextroamphetamine (ADDERALL XR) 20 MG 24 hr capsule Take 1 capsule (20 mg total) by mouth daily. 30 capsule 0  . amphetamine-dextroamphetamine (ADDERALL XR) 20 MG 24 hr capsule Take 1 capsule (20 mg total) by mouth daily. 30 capsule 0  . amphetamine-dextroamphetamine (ADDERALL XR) 20 MG 24 hr capsule Take 1 capsule (20 mg total) by mouth daily. 30 capsule 0  . amphetamine-dextroamphetamine (ADDERALL) 10 MG tablet Take 1 tablet (10  mg total) by mouth daily after lunch. 30 tablet 0  . amphetamine-dextroamphetamine (ADDERALL) 10 MG tablet Take 1 tablet (10 mg total) by mouth daily after lunch. 30 tablet 0  . amphetamine-dextroamphetamine (ADDERALL) 10 MG tablet Take 1 tablet (10 mg total) by mouth daily after lunch. 30 tablet 0  . chlorhexidine (PERIDEX) 0.12 % solution SMARTSIG:15 Milliliter(s) By Mouth Twice a Week    . HYDROcodone-Ibuprofen 10-200 MG TABS Take 1 tablet by mouth every 6 (six) hours as needed.    Marland Kitchen ibuprofen (ADVIL) 600 MG tablet Take 600 mg by mouth every 6 (six) hours as needed.    Marland Kitchen lisinopril (ZESTRIL) 10 MG tablet Take 1 tablet (10 mg total) by mouth daily. MUST SCHEDULE PHYSICAL EXAM 90 tablet 0  . oxyCODONE (OXY IR/ROXICODONE) 5 MG  immediate release tablet Take 5 mg by mouth every 4 (four) hours as needed.    . predniSONE (DELTASONE) 10 MG tablet Take 3 tabs on days 1-3, take 2 tabs on days 4-6, take 1 tab on days 7-9 18 tablet 0  . varenicline (CHANTIX STARTING MONTH PAK) 0.5 MG X 11 & 1 MG X 42 tablet Take one 0.5 mg tablet by mouth once daily for 3 days, then increase to one 0.5 mg tablet twice daily for 4 days, then increase to one 1 mg tablet twice daily. 53 tablet 0   No current facility-administered medications for this visit.     Musculoskeletal: Strength & Muscle Tone: UTA Gait & Station: normal Patient leans: N/A  Psychiatric Specialty Exam: Review of Systems  Psychiatric/Behavioral: Negative for agitation, behavioral problems, confusion, decreased concentration, dysphoric mood, hallucinations, self-injury, sleep disturbance and suicidal ideas. The patient is not nervous/anxious and is not hyperactive.   All other systems reviewed and are negative.   There were no vitals taken for this visit.There is no height or weight on file to calculate BMI.  General Appearance: Casual  Eye Contact:  Fair  Speech:  Clear and Coherent  Volume:  Normal  Mood:  Euthymic  Affect:  Congruent   Thought Process:  Goal Directed and Descriptions of Associations: Intact  Orientation:  Full (Time, Place, and Person)  Thought Content: Logical   Suicidal Thoughts:  No  Homicidal Thoughts:  No  Memory:  Immediate;   Fair Recent;   Fair Remote;   Fair  Judgement:  Fair  Insight:  Fair  Psychomotor Activity:  Normal  Concentration:  Concentration: Fair and Attention Span: Fair  Recall:  [employer] of Knowledge: Fair  Language: Fair  Akathisia:  No  Handed:  Right  AIMS (if indicated): UTA  Assets:  Communication Skills Desire for Improvement Housing Social Support  ADL's:  Intact  Cognition: WNL  Sleep:  Fair   Screenings: PHQ2-9     Office Visit from 09/03/2019 in [facility] Visit from 06/01/2018 in [facility] Visit from 11/18/2014 in [facility] at [facility]  PHQ-2 Total Score 0 0 0       Assessment and Plan: JAMARRION BUDAI is a 39 year old Caucasian male who has a history of ADHD, cannabis use disorder in remission, tobacco use disorder ,hypertension, was evaluated by telemedicine today.  Patient reports he is currently stable on medication.  He is however has been noncompliant with labs as ordered last visit.  Discussed the following plan.  Plan ADHD-stable Adderall extended release 20 mg p.o. daily Adderall immediate release 10 mg p.o. daily after lunch. Will not prescribe refills today for patient.  He has been noncompliant with labs.  Cannabis use disorder in remission We will monitor closely  Tobacco use disorder-unstable Provided counseling. He has Chantix available.  Noncompliance with treatment regimen-encouraged compliance.  Patient has been noncompliant with urine drug compliance analysis ordered in March 2021.  Discussed with patient to get it done prior to further refills of Adderall.  Also discussed with patient that urine drug compliance analysis has to be done as soon as we order  it and not to wait 3 months to get it done.  Follow-up in clinic in 3 months or sooner if needed.  I have spent atleast 20 minutes non face to face with patient today. More than 50 % of the time was spent for preparing to see the patient (  e.g., review of test, records ), ordering medications and test ,psychoeducation and supportive psychotherapy and care coordination,as well as documenting clinical information in electronic health record. This note was generated in part or whole with voice recognition software. Voice recognition is usually quite accurate but there are transcription errors that can and very often do occur. I apologize for any typographical errors that were not detected and corrected.         Ursula Alert, MD 09/16/2019, 12:28 PM

## 2019-09-20 ENCOUNTER — Other Ambulatory Visit: Payer: Self-pay | Admitting: Internal Medicine

## 2019-09-20 DIAGNOSIS — Z79899 Other long term (current) drug therapy: Secondary | ICD-10-CM | POA: Diagnosis not present

## 2019-09-23 ENCOUNTER — Telehealth: Payer: Self-pay

## 2019-09-23 DIAGNOSIS — F900 Attention-deficit hyperactivity disorder, predominantly inattentive type: Secondary | ICD-10-CM

## 2019-09-23 NOTE — Telephone Encounter (Signed)
pt states he had his labwork done on monday and needs to know if you going to prescribe medication.

## 2019-09-23 NOTE — Telephone Encounter (Signed)
Please let patient know I have to review his result . I have not received it yet.

## 2019-09-23 NOTE — Telephone Encounter (Signed)
pt states that he needs refills and he is going to be out today. need as soon as possible

## 2019-09-24 MED ORDER — AMPHETAMINE-DEXTROAMPHETAMINE 10 MG PO TABS: 10.0000 mg | ORAL_TABLET | Freq: Every day | ORAL | 0 refills | Status: DC

## 2019-09-24 NOTE — Telephone Encounter (Signed)
pt called again about getting a refill.  pt was told that labwork results have not come in yet.

## 2019-09-24 NOTE — Telephone Encounter (Signed)
Patient was noncompliant with urine drug analysis-it was ordered 3 months ago.  Patient just got it done couple of days ago and the results are still pending.  Patient is aware about controlled substance policy.  This was discussed with patient previously.  We will send Adderall 10 mg for 7 days supply until I review his urine drug analysis.  He is not due for his Adderall extended release yet.  Will wait for urine drug analysis before sending the extended release.

## 2019-09-24 NOTE — Telephone Encounter (Signed)
pt was called back and told that it would take 5-7 days to get results and pt asked what he needed to do or if enough medication could be sent in until you get results back. he is out

## 2019-09-24 NOTE — Telephone Encounter (Signed)
called [facility] and they checked they stated it was still processing.  i ask how long it would take and they states 7 - 10 days.

## 2019-09-25 LAB — COMPLIANCE DRUG ANALYSIS, UR

## 2019-09-28 ENCOUNTER — Telehealth: Payer: Self-pay | Admitting: Psychiatry

## 2019-09-28 ENCOUNTER — Ambulatory Visit (INDEPENDENT_AMBULATORY_CARE_PROVIDER_SITE_OTHER): Payer: BC Managed Care – PPO | Admitting: Internal Medicine

## 2019-09-28 ENCOUNTER — Other Ambulatory Visit: Payer: Self-pay

## 2019-09-28 ENCOUNTER — Encounter: Payer: Self-pay | Admitting: Internal Medicine

## 2019-09-28 VITALS — BP 132/78 | HR 76 | Temp 98.2°F | Ht 69.25 in | Wt 175.0 lb

## 2019-09-28 DIAGNOSIS — Z0000 Encounter for general adult medical examination without abnormal findings: Secondary | ICD-10-CM | POA: Diagnosis not present

## 2019-09-28 DIAGNOSIS — K219 Gastro-esophageal reflux disease without esophagitis: Secondary | ICD-10-CM | POA: Diagnosis not present

## 2019-09-28 DIAGNOSIS — A6001 Herpesviral infection of penis: Secondary | ICD-10-CM | POA: Diagnosis not present

## 2019-09-28 DIAGNOSIS — G8929 Other chronic pain: Secondary | ICD-10-CM | POA: Diagnosis not present

## 2019-09-28 DIAGNOSIS — F411 Generalized anxiety disorder: Secondary | ICD-10-CM

## 2019-09-28 DIAGNOSIS — F902 Attention-deficit hyperactivity disorder, combined type: Secondary | ICD-10-CM

## 2019-09-28 DIAGNOSIS — I10 Essential (primary) hypertension: Secondary | ICD-10-CM

## 2019-09-28 DIAGNOSIS — M25511 Pain in right shoulder: Secondary | ICD-10-CM

## 2019-09-28 MED ORDER — LISDEXAMFETAMINE DIMESYLATE 30 MG PO CAPS: 30.0000 mg | ORAL_CAPSULE | Freq: Every day | ORAL | 0 refills | Status: DC

## 2019-09-28 MED ORDER — KETOROLAC TROMETHAMINE 30 MG/ML IJ SOLN
30.0000 mg | Freq: Once | INTRAMUSCULAR | Status: AC
  Administered 2019-09-28: 30 mg via INTRAMUSCULAR

## 2019-09-28 NOTE — Telephone Encounter (Signed)
Attempted to call patient to discuss his drug compliance analysis which is positive for THC.  However no voicemail could be left since voicemail was full.  Will not prescribe stimulant for this patient unless I am able to discuss lab result with patient.

## 2019-09-28 NOTE — Progress Notes (Signed)
Subjective:    Patient ID: Peter Kramer, male    DOB: 1981/03/09, 39 y.o.   MRN: [id number]  HPI  Patient presents the clinic today for his annual exam.  He is also due to follow-up chronic conditions.  ADD: Currently managed on Adderall. He feels like Adderall is ineffective and would really like to go back on Vyvanse. He follows with psychiatry but is having a hard time getting a call back from their office.  GERD: No issues off meds.  There is no upper GI on file.  Genital Herpes: He denies recent outbreak.  He is not taking any oral antiviral at this time.  GAD: He is managing this well off meds.  He is not seeing a therapist.  He denies depression, SI/HI.  He does follow with psychiatry.    HTN: His BP today is 132/78.  He is taking Lisinopril as prescribed.  ECG from reviewed.  Chronic Right Shoulder Pain: Improved after Prednisone but now continues to be painful. He has a MRI scheduled 10/05/19.  Flu: never Tetanus: 05/2018 Covid: never Dentist: biannually  Diet: He does eat some meat. He consumes fruits and veggies daily. He tries to avoid fried foods. He drinks mostly water. Exercise: Biking  Review of Systems  Past Medical History:  Diagnosis Date  . ADHD (attention deficit hyperactivity disorder)   . Chicken pox   . Essential hypertension 08/08/2016  . Generalized anxiety disorder 07/17/2015  . Genital herpes   . Smoker 11/18/2014    Current Outpatient Medications  Medication Sig Dispense Refill  . amoxicillin (AMOXIL) 500 MG capsule Take 500 mg by mouth 3 (three) times daily.    Marland Kitchen amphetamine-dextroamphetamine (ADDERALL XR) 20 MG 24 hr capsule Take 1 capsule (20 mg total) by mouth daily. 30 capsule 0  . amphetamine-dextroamphetamine (ADDERALL XR) 20 MG 24 hr capsule Take 1 capsule (20 mg total) by mouth daily. 30 capsule 0  . amphetamine-dextroamphetamine (ADDERALL XR) 20 MG 24 hr capsule Take 1 capsule (20 mg total) by mouth daily. 30 capsule 0  .  amphetamine-dextroamphetamine (ADDERALL) 10 MG tablet Take 1 tablet (10 mg total) by mouth daily after lunch. 30 tablet 0  . amphetamine-dextroamphetamine (ADDERALL) 10 MG tablet Take 1 tablet (10 mg total) by mouth daily after lunch. 30 tablet 0  . amphetamine-dextroamphetamine (ADDERALL) 10 MG tablet Take 1 tablet (10 mg total) by mouth daily after lunch. 7 tablet 0  . chlorhexidine (PERIDEX) 0.12 % solution SMARTSIG:15 Milliliter(s) By Mouth Twice a Week    . HYDROcodone-Ibuprofen 10-200 MG TABS Take 1 tablet by mouth every 6 (six) hours as needed.    Marland Kitchen ibuprofen (ADVIL) 600 MG tablet Take 600 mg by mouth every 6 (six) hours as needed.    Marland Kitchen lisinopril (ZESTRIL) 10 MG tablet Take 1 tablet (10 mg total) by mouth daily. 90 tablet 0  . oxyCODONE (OXY IR/ROXICODONE) 5 MG immediate release tablet Take 5 mg by mouth every 4 (four) hours as needed.    . predniSONE (DELTASONE) 10 MG tablet Take 3 tabs on days 1-3, take 2 tabs on days 4-6, take 1 tab on days 7-9 18 tablet 0  . varenicline (CHANTIX STARTING MONTH PAK) 0.5 MG X 11 & 1 MG X 42 tablet Take one 0.5 mg tablet by mouth once daily for 3 days, then increase to one 0.5 mg tablet twice daily for 4 days, then increase to one 1 mg tablet twice daily. 53 tablet 0   No current facility-administered  medications for this visit.    Allergies  Allergen Reactions  . Bee Venom Swelling    Family History  Problem Relation Age of Onset  . ADD / ADHD Father   . Heart disease Maternal Grandmother   . Cancer Paternal Grandfather        brain  . ADD / ADHD Sister   . ADD / ADHD Sister   . Diabetes Neg Hx   . Stroke Neg Hx     Social History   Socioeconomic History  . Marital status: Married    Spouse name: emily  . Number of children: 2  . Years of education: Not on file  . Highest education level: GED or equivalent  Occupational History  . Occupation: [employer]    Comment: full time  Tobacco Use  . Smoking status: Current Every Day  Smoker    Packs/day: 1.00    Years: 14.00    Pack years: 14.00    Types: Cigarettes  . Smokeless tobacco: Never Used  . Tobacco comment: patient relapsed  Vaping Use  . Vaping Use: Never used  Substance and Sexual Activity  . Alcohol use: No    Alcohol/week: 0.0 standard drinks    Comment: occasional  . Drug use: No  . Sexual activity: Yes    Birth control/protection: None  Other Topics Concern  . Not on file  Social History Narrative  . Not on file   Social Determinants of Health   Financial Resource Strain:   . Difficulty of Paying Living Expenses:   Food Insecurity:   . Worried About Programme researcher, broadcasting/film/video in the Last Year:   . Barista in the Last Year:   Transportation Needs:   . Freight forwarder (Medical):   Marland Kitchen Lack of Transportation (Non-Medical):   Physical Activity:   . Days of Exercise per Week:   . Minutes of Exercise per Session:   Stress:   . Feeling of Stress :   Social Connections:   . Frequency of Communication with Friends and Family:   . Frequency of Social Gatherings with Friends and Family:   . Attends Religious Services:   . Active Member of Clubs or Organizations:   . Attends Banker Meetings:   Marland Kitchen Marital Status:   Intimate Partner Violence:   . Fear of Current or Ex-Partner:   . Emotionally Abused:   Marland Kitchen Physically Abused:   . Sexually Abused:      Constitutional: Denies fever, malaise, fatigue, headache or abrupt weight changes.  HEENT: Denies eye pain, eye redness, ear pain, ringing in the ears, wax buildup, runny nose, nasal congestion, bloody nose, or sore throat. Respiratory: Denies difficulty breathing, shortness of breath, cough or sputum production.   Cardiovascular: Denies chest pain, chest tightness, palpitations or swelling in the hands or feet.  Gastrointestinal: Denies abdominal pain, bloating, constipation, diarrhea or blood in the stool.  GU: Denies urgency, frequency, pain with urination, burning  sensation, blood in urine, odor or discharge. Musculoskeletal: Pt reports right shoulder pain. Denies decrease in range of motion, difficulty with gait, muscle pain or joint swelling.  Skin: Denies redness, rashes, lesions or ulcercations.  Neurological: Pt reports inattention. Denies dizziness, difficulty with memory, difficulty with speech or problems with balance and coordination.  Psych: Pt has a history of anxiety. Denies depression, SI/HI.  No other specific complaints in a complete review of systems (except as listed in HPI above).     Objective:  Physical Exam  BP 132/78   Pulse 76   Temp 98.2 F (36.8 C) (Temporal)   Ht 5' 9.25" (1.759 m)   Wt 175 lb (79.4 kg)   SpO2 98%   BMI 25.66 kg/m   Wt Readings from Last 3 Encounters:  09/03/19 176 lb (79.8 kg)  06/01/18 180 lb 12.8 oz (82 kg)  01/08/17 185 lb (83.9 kg)    General: Appears his stated age, well developed, well nourished in NAD. Skin: Warm, dry and intact. No rashes noted. HEENT: Head: normal shape and size; Eyes: sclera white, no icterus, conjunctiva pink, PERRLA and EOMs intact; Ears: Tm's gray and intact, normal light reflex; Neck:  Neck supple, trachea midline. No masses, lumps or thyromegaly present.  Cardiovascular: Normal rate and rhythm. S1,S2 noted.  No murmur, rubs or gallops noted. No JVD or BLE edema.  Pulmonary/Chest: Normal effort and positive vesicular breath sounds. No respiratory distress. No wheezes, rales or ronchi noted.  Abdomen: Soft and nontender. Normal bowel sounds. No distention or masses noted. Liver, spleen and kidneys non palpable. Musculoskeletal: Pain with external rotation of the right shoulder, normal internal rotation of the right shoulder. Pain with palpation over the [facility] joint, anterior proximal biceps tendon. Positive drop can test on the right. Strength 5/5 BUE/BLE. No difficulty with gait.  Neurological: Alert and oriented. Cranial nerves II-XII grossly intact. Coordination  normal.  Psychiatric: Mood and affect normal. Behavior is normal. Judgment and thought content normal.    BMET    Component Value Date/Time   NA 138 01/08/2017 1239   K 4.0 01/08/2017 1239   CL 103 01/08/2017 1239   CO2 27 01/08/2017 1239   GLUCOSE 99 01/08/2017 1239   BUN 18 01/08/2017 1239   CREATININE 0.94 01/08/2017 1239   CALCIUM 9.3 01/08/2017 1239   GFRNONAA >60 01/08/2017 1239   GFRAA >60 01/08/2017 1239    Lipid Panel  No results found for: CHOL, TRIG, HDL, CHOLHDL, VLDL, LDLCALC  CBC    Component Value Date/Time   WBC 10.5 01/08/2017 1239   RBC 5.21 01/08/2017 1239   HGB 15.2 01/08/2017 1239   HCT 44.0 01/08/2017 1239   PLT 249 01/08/2017 1239   MCV 84.5 01/08/2017 1239   MCH 29.2 01/08/2017 1239   MCHC 34.6 01/08/2017 1239   RDW 14.5 01/08/2017 1239    Hgb A1C No results found for: HGBA1C        Assessment & Plan:   Preventative Health Maintenance:  Encouraged him to get a flu shot in the fall Tetanus UTD Encouraged him to consume a balanced diet and exercise regimen Advised him to see a dentist annually Will check CBC, CMET, Lipid profile today  RTC in 1 year, sooner if needed Nicki Reaper, NP This visit occurred during the SARS-CoV-2 public health emergency.  Safety protocols were in place, including screening questions prior to the visit, additional usage of staff PPE, and extensive cleaning of exam room while observing appropriate contact time as indicated for disinfecting solutions.

## 2019-09-28 NOTE — Assessment & Plan Note (Signed)
Continue Lisinopril C met today

## 2019-09-28 NOTE — Addendum Note (Signed)
Addended by: Roena Malady on: 09/28/2019 11:58 AM   Modules accepted: Orders

## 2019-09-28 NOTE — Telephone Encounter (Signed)
Attempted to contact patient multiple times.  Mailbox is full and hence cannot leave a message.  Patient failed his drug analysis-it is positive for THC.  Patient according to most recent notes-denied any drug abuse including cannabis.  We will need to discuss this with patient before considering any controlled substance including his Adderall.

## 2019-09-28 NOTE — Assessment & Plan Note (Signed)
No issues off meds We will monitor 

## 2019-09-28 NOTE — Assessment & Plan Note (Signed)
Stable off meds We will monitor 

## 2019-09-28 NOTE — Patient Instructions (Signed)

## 2019-09-28 NOTE — Addendum Note (Signed)
Addended by: Alvina Chou on: 09/28/2019 04:02 PM   Modules accepted: Orders

## 2019-09-28 NOTE — Telephone Encounter (Signed)
pt calling to find out about his labwork and if he can get refills.

## 2019-09-28 NOTE — Assessment & Plan Note (Signed)
Will change from Adderall to Vyvanse UDS reviewed He no longer wants to follow with psych

## 2019-09-28 NOTE — Assessment & Plan Note (Signed)
No recent issues We will monitor

## 2019-09-28 NOTE — Assessment & Plan Note (Signed)
Toradol 30 mg IM x1 MRI pending

## 2019-09-30 NOTE — Telephone Encounter (Signed)
Attempted to contact patient again today-09/30/2019 to discuss his drug compliance analysis.  I was able to get a hold of him however patient reported he was in the middle of something.  Discussed with patient since I am not going to be in office for the next 2 weeks, I would like to talk to him.  Asked if it was okay to call back in 10 minutes and patient initially said yes.  However when I call back in 10 minutes patient did not pick up his phone and there was no voicemail to leave message.

## 2019-10-05 ENCOUNTER — Other Ambulatory Visit (INDEPENDENT_AMBULATORY_CARE_PROVIDER_SITE_OTHER): Payer: BC Managed Care – PPO

## 2019-10-05 ENCOUNTER — Other Ambulatory Visit: Payer: Self-pay

## 2019-10-05 ENCOUNTER — Ambulatory Visit
Admission: RE | Admit: 2019-10-05 | Discharge: 2019-10-05 | Disposition: A | Discharge status: Home / Self Care | Payer: BC Managed Care – PPO | Source: Ambulatory Visit | Attending: Internal Medicine | Admitting: Internal Medicine

## 2019-10-05 DIAGNOSIS — K219 Gastro-esophageal reflux disease without esophagitis: Secondary | ICD-10-CM

## 2019-10-05 DIAGNOSIS — Z0000 Encounter for general adult medical examination without abnormal findings: Secondary | ICD-10-CM | POA: Diagnosis not present

## 2019-10-05 DIAGNOSIS — I10 Essential (primary) hypertension: Secondary | ICD-10-CM | POA: Diagnosis not present

## 2019-10-05 DIAGNOSIS — G8929 Other chronic pain: Secondary | ICD-10-CM | POA: Diagnosis not present

## 2019-10-05 DIAGNOSIS — A6001 Herpesviral infection of penis: Secondary | ICD-10-CM | POA: Diagnosis not present

## 2019-10-05 DIAGNOSIS — M25511 Pain in right shoulder: Secondary | ICD-10-CM

## 2019-10-05 DIAGNOSIS — F902 Attention-deficit hyperactivity disorder, combined type: Secondary | ICD-10-CM | POA: Diagnosis not present

## 2019-10-05 DIAGNOSIS — F411 Generalized anxiety disorder: Secondary | ICD-10-CM

## 2019-10-05 LAB — COMPREHENSIVE METABOLIC PANEL
ALT: 24 U/L (ref 0–53)
AST: 21 U/L (ref 0–37)
Albumin: 4.6 g/dL (ref 3.5–5.2)
Alkaline Phosphatase: 80 U/L (ref 39–117)
BUN: 22 mg/dL (ref 6–23)
CO2: 27 mEq/L (ref 19–32)
Calcium: 9.6 mg/dL (ref 8.4–10.5)
Chloride: 100 mEq/L (ref 96–112)
Creatinine, Ser: 0.9 mg/dL (ref 0.40–1.50)
GFR: 94.83 mL/min (ref >60.00)
Glucose, Bld: 130 mg/dL — ABNORMAL HIGH (ref 70–99)
Potassium: 4.4 mEq/L (ref 3.5–5.1)
Sodium: 136 mEq/L (ref 135–145)
Total Bilirubin: 0.3 mg/dL (ref 0.2–1.2)
Total Protein: 6.6 g/dL (ref 6.0–8.3)

## 2019-10-05 LAB — CBC
HCT: 42.5 % (ref 39.0–52.0)
Hemoglobin: 14.3 g/dL (ref 13.0–17.0)
MCHC: 33.6 g/dL (ref 30.0–36.0)
MCV: 88.8 fl (ref 78.0–100.0)
Platelets: 337 10*3/uL (ref 150.0–400.0)
RBC: 4.79 Mil/uL (ref 4.22–5.81)
RDW: 13.8 % (ref 11.5–15.5)
WBC: 14.7 10*3/uL — ABNORMAL HIGH (ref 4.0–10.5)

## 2019-10-05 LAB — LIPID PANEL
Cholesterol: 179 mg/dL (ref 0–200)
HDL: 40.1 mg/dL (ref >39.00)
LDL Cholesterol: 119 mg/dL — ABNORMAL HIGH (ref 0–99)
NonHDL: 139.21
Total CHOL/HDL Ratio: 4
Triglycerides: 100 mg/dL (ref 0.0–149.0)
VLDL: 20 mg/dL (ref 0.0–40.0)

## 2019-10-08 ENCOUNTER — Encounter: Payer: Self-pay | Admitting: Internal Medicine

## 2019-10-22 ENCOUNTER — Other Ambulatory Visit: Payer: Self-pay | Admitting: Internal Medicine

## 2019-10-22 ENCOUNTER — Encounter: Payer: Self-pay | Admitting: Internal Medicine

## 2019-10-22 MED ORDER — PREDNISONE 10 MG PO TABS: ORAL_TABLET | ORAL | 0 refills | Status: DC

## 2019-10-22 MED ORDER — LISDEXAMFETAMINE DIMESYLATE 40 MG PO CAPS
40.0000 mg | ORAL_CAPSULE | ORAL | 0 refills | Status: DC
Start: 2019-10-22 — End: 2019-11-22

## 2019-11-22 MED ORDER — LISDEXAMFETAMINE DIMESYLATE 50 MG PO CAPS
50.0000 mg | ORAL_CAPSULE | Freq: Every day | ORAL | 0 refills | Status: DC
Start: 2019-11-22 — End: 2019-12-21

## 2019-11-22 NOTE — Addendum Note (Signed)
Addended by: Lorre Munroe on: 11/22/2019 12:01 PM   Modules accepted: Orders

## 2019-12-03 ENCOUNTER — Other Ambulatory Visit: Payer: Self-pay | Admitting: Internal Medicine

## 2019-12-03 DIAGNOSIS — F17200 Nicotine dependence, unspecified, uncomplicated: Secondary | ICD-10-CM

## 2019-12-03 MED ORDER — CHANTIX STARTING MONTH PAK 0.5 MG X 11 & 1 MG X 42 PO TABS: ORAL_TABLET | ORAL | 0 refills | Status: DC

## 2019-12-03 NOTE — Addendum Note (Signed)
Addended by: Roena Malady on: 12/03/2019 04:11 PM   Modules accepted: Orders

## 2019-12-16 ENCOUNTER — Other Ambulatory Visit: Payer: Self-pay

## 2019-12-16 ENCOUNTER — Telehealth (INDEPENDENT_AMBULATORY_CARE_PROVIDER_SITE_OTHER): Payer: BC Managed Care – PPO | Admitting: Psychiatry

## 2019-12-16 DIAGNOSIS — Z5329 Procedure and treatment not carried out because of patient's decision for other reasons: Secondary | ICD-10-CM

## 2019-12-16 NOTE — Progress Notes (Signed)
No response to call or text or video invite  

## 2019-12-21 ENCOUNTER — Other Ambulatory Visit: Payer: Self-pay

## 2019-12-21 MED ORDER — LISDEXAMFETAMINE DIMESYLATE 50 MG PO CAPS: 50.0000 mg | ORAL_CAPSULE | Freq: Every day | ORAL | 0 refills | Status: DC

## 2019-12-21 MED ORDER — CHANTIX STARTING MONTH PAK 0.5 MG X 11 & 1 MG X 42 PO TABS: ORAL_TABLET | ORAL | 0 refills | Status: DC

## 2019-12-21 NOTE — Addendum Note (Signed)
Addended by: Roena Malady on: 12/21/2019 02:43 PM   Modules accepted: Orders

## 2019-12-21 NOTE — Telephone Encounter (Signed)
Last filled 11/22/2019... please advise

## 2020-01-19 ENCOUNTER — Other Ambulatory Visit: Payer: Self-pay | Admitting: Internal Medicine

## 2020-01-19 ENCOUNTER — Encounter: Payer: Self-pay | Admitting: Internal Medicine

## 2020-01-19 MED ORDER — LISDEXAMFETAMINE DIMESYLATE 50 MG PO CAPS: 50.0000 mg | ORAL_CAPSULE | Freq: Every day | ORAL | 0 refills | Status: DC

## 2020-01-19 NOTE — Telephone Encounter (Signed)
Last filled 12/21/2019.... please advise  

## 2020-02-07 DIAGNOSIS — J101 Influenza due to other identified influenza virus with other respiratory manifestations: Secondary | ICD-10-CM | POA: Diagnosis not present

## 2020-02-07 DIAGNOSIS — J189 Pneumonia, unspecified organism: Secondary | ICD-10-CM | POA: Diagnosis not present

## 2020-02-07 DIAGNOSIS — Z20822 Contact with and (suspected) exposure to covid-19: Secondary | ICD-10-CM | POA: Diagnosis not present

## 2020-02-07 DIAGNOSIS — M791 Myalgia, unspecified site: Secondary | ICD-10-CM | POA: Diagnosis not present

## 2020-02-19 ENCOUNTER — Encounter: Payer: Self-pay | Admitting: Internal Medicine

## 2020-02-20 NOTE — Telephone Encounter (Signed)
vyvanse last filled 01/19/2020

## 2020-02-21 MED ORDER — LISDEXAMFETAMINE DIMESYLATE 50 MG PO CAPS: 50.0000 mg | ORAL_CAPSULE | Freq: Every day | ORAL | 0 refills | Status: DC

## 2020-06-12 DIAGNOSIS — Z20822 Contact with and (suspected) exposure to covid-19: Secondary | ICD-10-CM | POA: Diagnosis not present

## 2020-06-12 DIAGNOSIS — J0100 Acute maxillary sinusitis, unspecified: Secondary | ICD-10-CM | POA: Diagnosis not present

## 2020-12-25 DIAGNOSIS — U071 COVID-19: Secondary | ICD-10-CM | POA: Diagnosis not present

## 2020-12-25 DIAGNOSIS — Z20822 Contact with and (suspected) exposure to covid-19: Secondary | ICD-10-CM | POA: Diagnosis not present

## 2021-01-09 ENCOUNTER — Telehealth (INDEPENDENT_AMBULATORY_CARE_PROVIDER_SITE_OTHER): Payer: BC Managed Care – PPO | Admitting: Internal Medicine

## 2021-01-09 ENCOUNTER — Encounter: Payer: Self-pay | Admitting: Internal Medicine

## 2021-01-09 ENCOUNTER — Other Ambulatory Visit: Payer: Self-pay

## 2021-01-09 DIAGNOSIS — Z8616 Personal history of COVID-19: Secondary | ICD-10-CM

## 2021-01-09 DIAGNOSIS — B9689 Other specified bacterial agents as the cause of diseases classified elsewhere: Secondary | ICD-10-CM

## 2021-01-09 DIAGNOSIS — I10 Essential (primary) hypertension: Secondary | ICD-10-CM

## 2021-01-09 DIAGNOSIS — M25511 Pain in right shoulder: Secondary | ICD-10-CM

## 2021-01-09 DIAGNOSIS — G8929 Other chronic pain: Secondary | ICD-10-CM

## 2021-01-09 DIAGNOSIS — F411 Generalized anxiety disorder: Secondary | ICD-10-CM

## 2021-01-09 DIAGNOSIS — J019 Acute sinusitis, unspecified: Secondary | ICD-10-CM | POA: Diagnosis not present

## 2021-01-09 DIAGNOSIS — K219 Gastro-esophageal reflux disease without esophagitis: Secondary | ICD-10-CM

## 2021-01-09 DIAGNOSIS — A6001 Herpesviral infection of penis: Secondary | ICD-10-CM

## 2021-01-09 DIAGNOSIS — F988 Other specified behavioral and emotional disorders with onset usually occurring in childhood and adolescence: Secondary | ICD-10-CM

## 2021-01-09 MED ORDER — LISDEXAMFETAMINE DIMESYLATE 50 MG PO CAPS: 50.0000 mg | ORAL_CAPSULE | Freq: Every day | ORAL | 0 refills | Status: DC

## 2021-01-09 MED ORDER — LISINOPRIL 10 MG PO TABS: 10.0000 mg | ORAL_TABLET | Freq: Every day | ORAL | 0 refills | Status: DC

## 2021-01-09 MED ORDER — AMOXICILLIN-POT CLAVULANATE 875-125 MG PO TABS
1.0000 | ORAL_TABLET | Freq: Two times a day (BID) | ORAL | 0 refills | Status: DC
Start: 2021-01-09 — End: 2021-04-06

## 2021-01-09 NOTE — Progress Notes (Signed)
Virtual Visit via Video Note  I connected with Peter Kramer on 01/09/21 at  4:00 PM EDT by a video enabled telemedicine application and verified that I am speaking with the correct person using two identifiers.  Location: Patient: In his car Provider: Office  Person's participating in this video visit: Nicki Reaper, NP and Ellan Lambert.   I discussed the limitations of evaluation and management by telemedicine and the availability of in person appointments. The patient expressed understanding and agreed to proceed.  History of Present Illness:  Pt due for follow up of chronic conditions.  ADD: Managed on Vyvanse. He is not currently following with psychiatry.  GERD: Currently not an issue. He is not taking any medications for this. There is no upper GI on file.  Genital Herpes: He denies recent outbreak. He is not taking an antiviral at this time.  GAD: He is not currently seeing a therapist. He is not taking any medications for this. He denies depression, SI/HI.  HTN: He is  not taking Lisinopril as prescribed. There is no ECG on file.  Chronic Right Shoulder Pain: He takes as needed. MRI from 09/2019 reviewed.   He also reports facial pain and pressure and dark-colored mucus.  He reports this started 10 days ago after being diagnosed with COVID after traveling back from [place].  He reports he did the antiviral treatment but continues to have this pain and pressure in his face.  He denies fever chills or body aches at this time.  Past Medical History:  Diagnosis Date   ADHD (attention deficit hyperactivity disorder)    Chicken pox    Essential hypertension 08/08/2016   Generalized anxiety disorder 07/17/2015   Genital herpes    Smoker 11/18/2014    Current Outpatient Medications  Medication Sig Dispense Refill   chlorhexidine (PERIDEX) 0.12 % solution SMARTSIG:15 Milliliter(s) By Mouth Twice a Week     lisdexamfetamine (VYVANSE) 50 MG capsule Take 1 capsule (50 mg total)  by mouth daily. 30 capsule 0   lisinopril (ZESTRIL) 10 MG tablet Take 1 tablet (10 mg total) by mouth daily. 90 tablet 0   predniSONE (DELTASONE) 10 MG tablet Take 6 tabs day 1, 5 tabs day 2, 4 tabs day 3, 3 tabs day 4, 2 tabs day 5, 1 tab day 6 21 tablet 0   varenicline (CHANTIX STARTING MONTH PAK) 0.5 MG X 11 & 1 MG X 42 tablet Take one 0.5 mg tablet by mouth once daily for 3 days, then increase to one 0.5 mg tablet twice daily for 4 days, then increase to one 1 mg tablet twice daily. 53 tablet 0   No current facility-administered medications for this visit.    Allergies  Allergen Reactions   Bee Venom Swelling    Family History  Problem Relation Age of Onset   ADD / ADHD Father    Heart disease Maternal Grandmother    Cancer Paternal Grandfather        brain   ADD / ADHD Sister    ADD / ADHD Sister    Diabetes Neg Hx    Stroke Neg Hx     Social History   Socioeconomic History   Marital status: Married    Spouse name: emily   Number of children: 2   Years of education: Not on file   Highest education level: GED or equivalent  Occupational History   Occupation: [employer]    Comment: full time  Tobacco Use   Smoking  status: Every Day    Packs/day: 1.00    Years: 14.00    Pack years: 14.00    Types: Cigarettes   Smokeless tobacco: Never   Tobacco comments:    patient relapsed  Vaping Use   Vaping Use: Never used  Substance and Sexual Activity   Alcohol use: No    Alcohol/week: 0.0 standard drinks    Comment: occasional   Drug use: No   Sexual activity: Yes    Birth control/protection: None  Other Topics Concern   Not on file  Social History Narrative   Not on file   Social Determinants of Health   Financial Resource Strain: Not on file  Food Insecurity: Not on file  Transportation Needs: Not on file  Physical Activity: Not on file  Stress: Not on file  Social Connections: Not on file  Intimate Partner Violence: Not on file     Constitutional:  Denies fever, malaise, fatigue, headache or abrupt weight changes.  HEENT: Patient reports facial pain and pressure.  Denies eye pain, eye redness, ear pain, ringing in the ears, wax buildup, runny nose, nasal congestion, bloody nose, or sore throat. Respiratory: Denies difficulty breathing, shortness of breath, cough or sputum production.   Cardiovascular: Denies chest pain, chest tightness, palpitations or swelling in the hands or feet.  Gastrointestinal: Denies abdominal pain, bloating, constipation, diarrhea or blood in the stool.  GU: Denies urgency, frequency, pain with urination, burning sensation, blood in urine, odor or discharge. Musculoskeletal: Denies decrease in range of motion, difficulty with gait, muscle pain or joint pain and swelling.  Skin: Denies redness, rashes, lesions or ulcercations.  Neurological: Pt reports inattention. Denies dizziness, difficulty with memory, difficulty with speech or problems with balance and coordination.  Psych: Pt has a history of anxiety. Denies depression, SI/HI.  No other specific complaints in a complete review of systems (except as listed in HPI above).  Observations/Objective:   Wt Readings from Last 3 Encounters:  09/28/19 175 lb (79.4 kg)  09/03/19 176 lb (79.8 kg)  06/01/18 180 lb 12.8 oz (82 kg)    General: Appears his stated age, well developed, well nourished in NAD. Skin: Warm, dry and intact. No rashes, lesions or ulcerations noted. HEENT: Head: normal shape and size;  Pulmonary/Chest: Normal effort. No respiratory distress. Neurological: Alert and oriented.  Psychiatric: Mood and affect normal. Behavior is normal. Judgment and thought content normal.   BMET    Component Value Date/Time   NA 136 10/05/2019 1334   K 4.4 10/05/2019 1334   CL 100 10/05/2019 1334   CO2 27 10/05/2019 1334   GLUCOSE 130 (H) 10/05/2019 1334   BUN 22 10/05/2019 1334   CREATININE 0.90 10/05/2019 1334   CALCIUM 9.6 10/05/2019 1334   GFRNONAA  >60 01/08/2017 1239   GFRAA >60 01/08/2017 1239    Lipid Panel     Component Value Date/Time   CHOL 179 10/05/2019 1334   TRIG 100.0 10/05/2019 1334   HDL 40.10 10/05/2019 1334   CHOLHDL 4 10/05/2019 1334   VLDL 20.0 10/05/2019 1334   LDLCALC 119 (H) 10/05/2019 1334    CBC    Component Value Date/Time   WBC 14.7 (H) 10/05/2019 1334   RBC 4.79 10/05/2019 1334   HGB 14.3 10/05/2019 1334   HCT 42.5 10/05/2019 1334   PLT 337.0 10/05/2019 1334   MCV 88.8 10/05/2019 1334   MCH 29.2 01/08/2017 1239   MCHC 33.6 10/05/2019 1334   RDW 13.8 10/05/2019 1334  Hgb A1C No results found for: HGBA1C     Assessment and Plan:  Acute Bacterial Sinusitis status post COVID:  Rx for Augmentin 875-125 mg p.o. twice daily x10 days  Follow Up Instructions:    I discussed the assessment and treatment plan with the patient. The patient was provided an opportunity to ask questions and all were answered. The patient agreed with the plan and demonstrated an understanding of the instructions.   The patient was advised to call back or seek an in-person evaluation if the symptoms worsen or if the condition fails to improve as anticipated.   Nicki Reaper, NP

## 2021-01-09 NOTE — Assessment & Plan Note (Signed)
No recent outbreaks. 

## 2021-01-09 NOTE — Assessment & Plan Note (Signed)
Vyvanse refilled today 

## 2021-01-09 NOTE — Patient Instructions (Signed)

## 2021-01-09 NOTE — Assessment & Plan Note (Signed)
Currently not an issue Will monitor 

## 2021-01-09 NOTE — Assessment & Plan Note (Signed)
He has run out of his Lisinopril, refilled today Reinforced DASH diet

## 2021-01-09 NOTE — Assessment & Plan Note (Signed)
Encourage regular stretching 

## 2021-01-09 NOTE — Assessment & Plan Note (Signed)
Currently not an issue Support offered 

## 2021-01-10 ENCOUNTER — Encounter: Payer: Self-pay | Admitting: Internal Medicine

## 2021-01-10 ENCOUNTER — Ambulatory Visit: Payer: Self-pay | Admitting: Internal Medicine

## 2021-01-10 NOTE — Telephone Encounter (Signed)
Copied from CRM [id number]. Topic: General - Other >> Jan 10, 2021  9:33 AM Herby Abraham C wrote: Reason for CRM: pt called in to make provider aware that a PA is needed for his Rx vyvanse. Showing pt has sent a mychart message to provider.   Please assist further.

## 2021-01-11 MED ORDER — AMPHETAMINE-DEXTROAMPHETAMINE 10 MG PO TABS
10.0000 mg | ORAL_TABLET | Freq: Every day | ORAL | 0 refills | Status: DC
Start: 2021-01-11 — End: 2021-02-08

## 2021-01-11 MED ORDER — AMPHETAMINE-DEXTROAMPHET ER 20 MG PO CP24
20.0000 mg | ORAL_CAPSULE | Freq: Every day | ORAL | 0 refills | Status: DC
Start: 2021-01-11 — End: 2021-02-08

## 2021-01-11 NOTE — Addendum Note (Signed)
Addended by: Lorre Munroe on: 01/11/2021 12:44 PM   Modules accepted: Orders

## 2021-02-08 ENCOUNTER — Other Ambulatory Visit: Payer: Self-pay | Admitting: Internal Medicine

## 2021-02-08 MED ORDER — AMPHETAMINE-DEXTROAMPHET ER 20 MG PO CP24: 20.0000 mg | ORAL_CAPSULE | Freq: Every day | ORAL | 0 refills | Status: DC

## 2021-02-08 MED ORDER — AMPHETAMINE-DEXTROAMPHETAMINE 10 MG PO TABS: 10.0000 mg | ORAL_TABLET | Freq: Every day | ORAL | 0 refills | Status: DC

## 2021-02-08 NOTE — Addendum Note (Signed)
Addended by: Lonna Cobb on: 02/08/2021 11:50 AM   Modules accepted: Orders

## 2021-02-09 MED ORDER — AMPHETAMINE-DEXTROAMPHET ER 20 MG PO CP24: 20.0000 mg | ORAL_CAPSULE | Freq: Every day | ORAL | 0 refills | Status: DC

## 2021-02-09 MED ORDER — AMPHETAMINE-DEXTROAMPHETAMINE 10 MG PO TABS: 10.0000 mg | ORAL_TABLET | Freq: Every day | ORAL | 0 refills | Status: DC

## 2021-03-06 ENCOUNTER — Other Ambulatory Visit: Payer: Self-pay | Admitting: Internal Medicine

## 2021-03-06 MED ORDER — AMPHETAMINE-DEXTROAMPHETAMINE 10 MG PO TABS: 10.0000 mg | ORAL_TABLET | Freq: Every day | ORAL | 0 refills | Status: DC

## 2021-03-06 MED ORDER — AMPHETAMINE-DEXTROAMPHET ER 20 MG PO CP24: 20.0000 mg | ORAL_CAPSULE | Freq: Every day | ORAL | 0 refills | Status: DC

## 2021-03-07 ENCOUNTER — Other Ambulatory Visit: Payer: Self-pay | Admitting: Internal Medicine

## 2021-03-07 ENCOUNTER — Encounter: Payer: Self-pay | Admitting: Internal Medicine

## 2021-03-08 ENCOUNTER — Other Ambulatory Visit: Payer: Self-pay

## 2021-03-09 MED ORDER — AMPHETAMINE-DEXTROAMPHETAMINE 10 MG PO TABS: 10.0000 mg | ORAL_TABLET | Freq: Every day | ORAL | 0 refills | Status: DC

## 2021-03-09 MED ORDER — AMPHETAMINE-DEXTROAMPHET ER 20 MG PO CP24: 20.0000 mg | ORAL_CAPSULE | Freq: Every day | ORAL | 0 refills | Status: DC

## 2021-03-09 NOTE — Addendum Note (Signed)
Addended by: Elby Beck F on: 03/09/2021 01:15 PM   Modules accepted: Orders

## 2021-03-09 NOTE — Telephone Encounter (Signed)
Pt is calling to report that the [facility] is out of stock of the following medication:amphetamine-dextroamphetamine (ADDERALL) 10 MG tablet [[id number]] & amphetamine-dextroamphetamine (ADDERALL XR) 20 MG 24 hr capsule [[id number]]   Pt is calling to request if the medication can be sent to a different pharmacy as listed below:   [facility] [address] [place], [place]. [address] [phone]  Pt Cb- [phone]

## 2021-03-09 NOTE — Telephone Encounter (Signed)
Per chart Rx- prescription have been sent to pharmacy requested

## 2021-03-31 DIAGNOSIS — S67195A Crushing injury of left ring finger, initial encounter: Secondary | ICD-10-CM | POA: Diagnosis not present

## 2021-03-31 DIAGNOSIS — S62665A Nondisplaced fracture of distal phalanx of left ring finger, initial encounter for closed fracture: Secondary | ICD-10-CM | POA: Diagnosis not present

## 2021-04-05 ENCOUNTER — Other Ambulatory Visit: Payer: Self-pay | Admitting: Internal Medicine

## 2021-04-05 ENCOUNTER — Ambulatory Visit: Payer: Self-pay | Admitting: *Deleted

## 2021-04-05 DIAGNOSIS — I10 Essential (primary) hypertension: Secondary | ICD-10-CM

## 2021-04-05 MED ORDER — LISINOPRIL 10 MG PO TABS: 10.0000 mg | ORAL_TABLET | Freq: Every day | ORAL | 6 refills | Status: DC

## 2021-04-05 MED ORDER — AMPHETAMINE-DEXTROAMPHETAMINE 10 MG PO TABS: 10.0000 mg | ORAL_TABLET | Freq: Every day | ORAL | 0 refills | Status: DC

## 2021-04-05 MED ORDER — AMPHETAMINE-DEXTROAMPHET ER 20 MG PO CP24: 20.0000 mg | ORAL_CAPSULE | Freq: Every day | ORAL | 0 refills | Status: DC

## 2021-04-05 NOTE — Telephone Encounter (Signed)
°  Chief Complaint: increase in pain left ring finger ,requesting appt  Symptoms: swelling, pain worse Frequency: steel plate fell on left ring finger last Friday 03/30/21 Pertinent Negatives: Patient denies inability to move finger  no swelling in hand  Disposition: [] ED /[] Urgent Care (no appt availability in office) / [x] Appointment(In office/virtual)/ []  [facility]/ [] Home Care/ [] Refused Recommended Disposition /[]  Mobile Bus/ []  Follow-up with PCP Additional Notes:  Patient seen in UC already and pain continues. Splint applied to left ring finger . Appt scheduled for 04/06/21  Patient aware if pain worsens go to UC or ED   Reason for Disposition  [1] SEVERE pain AND [2] not improved 2 hours after pain medicine/ice packs  Answer Assessment - Initial Assessment Questions 1. MECHANISM: "How did the injury happen?"      Steel plate fell on left ring finger  2. ONSET: "When did the injury happen?" (Minutes or hours ago)      Friday 03/30/21 3. LOCATION: "What part of the finger is injured?" "Is the nail damaged?"      Middle knuckle to  fingertip 4. APPEARANCE of the INJURY: "What does the injury look like?"      Swollen, multi color  5. SEVERITY: "Can you use the hand normally?"  "Can you bend your fingers into a ball and then fully open them?"     Can not use normal . Can use other fingers on left hand  6. SIZE: For cuts, bruises, or swelling, ask: "How large is it?" (e.g., inches or centimeters;  entire finger)      Small cut does not look infected  7. PAIN: "Is there pain?" If Yes, ask: "How bad is the pain?"    (e.g., Scale 1-10; or mild, moderate, severe)  - NONE (0): no pain.  - MILD (1-3): doesn't interfere with normal activities.   - MODERATE (4-7): interferes with normal activities or awakens from sleep.  - SEVERE (8-10): excruciating pain, unable to hold a glass of water or bend finger even a little.     severe 8. TETANUS: For any breaks in the skin,  ask: "When was the last tetanus booster?"     unknown 9. OTHER SYMPTOMS: "Do you have any other symptoms?"     swelling in finger , severe pain  10. PREGNANCY: "Is there any chance you are pregnant?" "When was your last menstrual period?"       na  Protocols used: Finger Injury-A-AH

## 2021-04-05 NOTE — Addendum Note (Signed)
Addended by: Lorre Munroe on: 04/05/2021 08:19 AM   Modules accepted: Orders

## 2021-04-06 ENCOUNTER — Ambulatory Visit (INDEPENDENT_AMBULATORY_CARE_PROVIDER_SITE_OTHER): Payer: BC Managed Care – PPO | Admitting: Internal Medicine

## 2021-04-06 ENCOUNTER — Other Ambulatory Visit: Payer: Self-pay

## 2021-04-06 ENCOUNTER — Encounter: Payer: Self-pay | Admitting: Internal Medicine

## 2021-04-06 VITALS — BP 125/86 | HR 76 | Ht 69.25 in | Wt 179.4 lb

## 2021-04-06 DIAGNOSIS — S62661A Nondisplaced fracture of distal phalanx of left index finger, initial encounter for closed fracture: Secondary | ICD-10-CM

## 2021-04-06 MED ORDER — IBUPROFEN 800 MG PO TABS: 800.0000 mg | ORAL_TABLET | Freq: Three times a day (TID) | ORAL | 0 refills | Status: DC | PRN

## 2021-04-06 NOTE — Progress Notes (Signed)
Subjective:    Patient ID: Peter Kramer, male    DOB: September 03, 1980, 41 y.o.   MRN: [id number]  HPI  Patient presents to the clinic today with complaint of pain of his left index finger.  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  He did not present to urgent care until 1/6.  He reports they obtained an x-ray and advised him that it was fractured.  They placed him in a splint and gave him Percocet for 5 days.  He reports the Percocet has not touched his pain in his finger.  He describes the pain as throbbing and radiates all the way up into his shoulder.  He reports associated numbness, tingling and weakness.  He reports the more he works the more his finger hurts.  He has been applying ice twice daily.  He would like to know how to go about filing a Workmen's Comp. claim.  Review of Systems     Past Medical History:  Diagnosis Date   ADHD (attention deficit hyperactivity disorder)    Chicken pox    Essential hypertension 08/08/2016   Generalized anxiety disorder 07/17/2015   Genital herpes    Smoker 11/18/2014    Current Outpatient Medications  Medication Sig Dispense Refill   lisinopril (ZESTRIL) 10 MG tablet Take 1 tablet (10 mg total) by mouth daily. 30 tablet 6   amoxicillin-clavulanate (AUGMENTIN) 875-125 MG tablet Take 1 tablet by mouth 2 (two) times daily. 20 tablet 0   amphetamine-dextroamphetamine (ADDERALL XR) 20 MG 24 hr capsule Take 1 capsule (20 mg total) by mouth daily. 30 capsule 0   amphetamine-dextroamphetamine (ADDERALL) 10 MG tablet Take 1 tablet (10 mg total) by mouth daily after lunch. 30 tablet 0   No current facility-administered medications for this visit.    Allergies  Allergen Reactions   Bee Venom Swelling    Family History  Problem Relation Age of Onset   ADD / ADHD Father    Heart disease Maternal Grandmother    Cancer Paternal Grandfather        brain   ADD / ADHD Sister    ADD / ADHD Sister     Diabetes Neg Hx    Stroke Neg Hx     Social History   Socioeconomic History   Marital status: Married    Spouse name: emily   Number of children: 2   Years of education: Not on file   Highest education level: GED or equivalent  Occupational History   Occupation: [employer]    Comment: full time  Tobacco Use   Smoking status: Every Day    Packs/day: 1.00    Years: 14.00    Pack years: 14.00    Types: Cigarettes   Smokeless tobacco: Never   Tobacco comments:    patient relapsed  Vaping Use   Vaping Use: Never used  Substance and Sexual Activity   Alcohol use: No    Alcohol/week: 0.0 standard drinks    Comment: occasional   Drug use: No   Sexual activity: Yes    Birth control/protection: None  Other Topics Concern   Not on file  Social History Narrative   Not on file   Social Determinants of Health   Financial Resource Strain: Not on file  Food Insecurity: Not on file  Transportation Needs: Not on file  Physical Activity: Not on file  Stress: Not on file  Social Connections: Not  on file  Intimate Partner Violence: Not on file     Constitutional: Denies fever, malaise, fatigue, headache or abrupt weight changes.  Respiratory: Denies difficulty breathing, shortness of breath, cough or sputum production.   Cardiovascular: Denies chest pain, chest tightness, palpitations or swelling in the hands or feet.  Musculoskeletal: Patient reports pain in his left index finger, decreased range of motion and joint swelling. Skin: Patient reports bruising to left index finger. Neurological: Patient reports numbness, tingling weakness of left index finger.  Denies dizziness, difficulty with memory, difficulty with speech or problems with balance and coordination.   No other specific complaints in a complete review of systems (except as listed in HPI above).  Objective:   Physical Exam  BP 125/86    Pulse 76    Ht 5' 9.25" (1.759 m)    Wt 179 lb 6.4 oz (81.4 kg)    SpO2 100%     BMI 26.30 kg/m   Wt Readings from Last 3 Encounters:  09/28/19 175 lb (79.4 kg)  09/03/19 176 lb (79.8 kg)  06/01/18 180 lb 12.8 oz (82 kg)    General: Appears his stated age, well developed, well nourished in NAD. Skin: Warm, dry and intact.  Bruising and subungual hematoma noted of left index finger. Cardiovascular: Normal rate and rhythm. S1,S2 noted.  Radial pulse 2+ on the left. Pulmonary/Chest: Normal effort and positive vesicular breath sounds. No respiratory distress. No wheezes, rales or ronchi noted.  Musculoskeletal: Decreased flexion of the left index finger.  Normal extension of the left index finger. Neurological: Alert and oriented.   BMET    Component Value Date/Time   NA 136 10/05/2019 1334   K 4.4 10/05/2019 1334   CL 100 10/05/2019 1334   CO2 27 10/05/2019 1334   GLUCOSE 130 (H) 10/05/2019 1334   BUN 22 10/05/2019 1334   CREATININE 0.90 10/05/2019 1334   CALCIUM 9.6 10/05/2019 1334   GFRNONAA >60 01/08/2017 1239   GFRAA >60 01/08/2017 1239    Lipid Panel     Component Value Date/Time   CHOL 179 10/05/2019 1334   TRIG 100.0 10/05/2019 1334   HDL 40.10 10/05/2019 1334   CHOLHDL 4 10/05/2019 1334   VLDL 20.0 10/05/2019 1334   LDLCALC 119 (H) 10/05/2019 1334    CBC    Component Value Date/Time   WBC 14.7 (H) 10/05/2019 1334   RBC 4.79 10/05/2019 1334   HGB 14.3 10/05/2019 1334   HCT 42.5 10/05/2019 1334   PLT 337.0 10/05/2019 1334   MCV 88.8 10/05/2019 1334   MCH 29.2 01/08/2017 1239   MCHC 33.6 10/05/2019 1334   RDW 13.8 10/05/2019 1334    Hgb A1C No results found for: HGBA1C         Assessment & Plan:   Urgent Care Follow Up for Left Index Finger:  Unable to review urgent care notes and imaging Advised him he would need to stay in the splint for at least 4 weeks There is no follow-up with Ortho planned Advised him to notify his boss that he would like to file a Workmen's Comp. Claim Rx for ibuprofen 800 mg every 8 hours as  needed-consume with food Continue ice  Return precautions discussed  Nicki Reaper, NP This visit occurred during the SARS-CoV-2 public health emergency.  Safety protocols were in place, including screening questions prior to the visit, additional usage of staff PPE, and extensive cleaning of exam room while observing appropriate contact time as indicated for disinfecting solutions.

## 2021-04-07 ENCOUNTER — Encounter: Payer: Self-pay | Admitting: Internal Medicine

## 2021-04-07 NOTE — Patient Instructions (Signed)
Finger Fracture, Adult A finger fracture is a break in any of the bones in your fingers. Your doctor may put a splint or cast on your finger so it will not move while it heals. What are the causes? The main cause of a finger fracture is injury, such as from playing sports, falling, or closing your finger in a drawer or door. What increases the risk? You may be more likely to get this condition: Playing sports. Working with Sales promotion account executive. If you have weak bones (osteoporosis). What are the signs or symptoms? The main symptoms of a fractured finger are pain, bruising, and swelling shortly after the injury. How is this treated? Treatment depends on how severe your fracture is. If the bones are still in place, your doctor may put your finger in a splint. If many fingers are fractured, you may need a cast. A cast may be placed up to the elbow. This will keep your fingers and hand from moving. If the bones are out of place, your doctor may move them back into place by hand or by surgery. You may also need to do physical therapy exercises to help your finger move better and get stronger. Follow these instructions at home: If you have a removable splint:  Wear the splint as told by your doctor. Take it off only as told by your doctor. Check the skin around the splint every day. Tell your doctor if you see problems. Loosen the splint if your fingers: Tingle. Become numb. Turn cold and blue. Keep the splint clean and dry. If you have a nonremovable cast: Do not put pressure on any part of the cast until it is fully hardened. Do not stick anything inside the cast to scratch your skin. Check the skin around the cast every day. Tell your doctor if you see problems. You may put lotion on dry skin around the cast. Do not put lotion on the skin under the cast. Keep the cast clean and dry. Bathing Do not take baths, swim, or use a hot tub. Ask your doctor if you may take showers. If the splint or cast is  not waterproof: Do not let it get wet. Cover it with a watertight covering when you take a bath or shower. Managing pain, stiffness, and swelling If told, put ice on the injured area. To do this: If you have a removable splint, take it off as told by your doctor. Put ice in a plastic bag. Place a towel between your skin and the bag, or between your cast and the bag. Leave the ice on for 20 minutes, 2-3 times a day. Take off the ice if your skin turns bright red. This is very important. If you cannot feel pain, heat, or cold, you have a greater risk of damage to the area. Move your fingers often. Raise the injured area above the level of your heart while you are sitting or lying down. Activity Ask your doctor if you should avoid driving or using machines while you are taking your medicine. Do physical therapy exercises as told by your doctor. Return to your normal activities when your doctor says that it is safe. Ask your doctor when it is safe to drive if you have a splint or cast on your finger. General instructions Do not smoke or use any products that contain nicotine or tobacco. Using these products can make it take longer for your bones to heal. If you need help quitting, ask your doctor. Take over-the-counter and  prescription medicines only as told by your doctor. Keep all follow-up visits. Contact a doctor if: Your pain or swelling gets worse, even with treatment. You have trouble moving your finger. Get help right away if: Your finger gets numb or turns blue. Summary A finger fracture is a break in any of the bones in your fingers. You may need to wear a splint or cast on your finger, so it will not move while it heals. If told, put ice on the injured area for 20 minutes, 2-3 times a day. This information is not intended to replace advice given to you by your health care provider. Make sure you discuss any questions you have with your health care provider. Document Revised:  02/29/2020 Document Reviewed: 01/18/2020 Elsevier Patient Education  2022 [employer].

## 2021-05-01 ENCOUNTER — Other Ambulatory Visit: Payer: Self-pay | Admitting: Internal Medicine

## 2021-05-01 MED ORDER — AMPHETAMINE-DEXTROAMPHETAMINE 10 MG PO TABS: 10.0000 mg | ORAL_TABLET | Freq: Every day | ORAL | 0 refills | Status: DC

## 2021-05-01 MED ORDER — AMPHETAMINE-DEXTROAMPHET ER 20 MG PO CP24: 20.0000 mg | ORAL_CAPSULE | Freq: Every day | ORAL | 0 refills | Status: DC

## 2021-05-01 MED ORDER — IBUPROFEN 800 MG PO TABS: 800.0000 mg | ORAL_TABLET | Freq: Three times a day (TID) | ORAL | 0 refills | Status: DC | PRN

## 2021-05-09 ENCOUNTER — Telehealth: Payer: Self-pay | Admitting: Internal Medicine

## 2021-05-09 NOTE — Telephone Encounter (Signed)
So they have the XR, just not the 10 mg IR?

## 2021-05-09 NOTE — Telephone Encounter (Signed)
° ° ° °  Would he be willing to switch to Vyvanse?    Copied from CRM [id number]. Topic: General - Other >> May 08, 2021 12:26 PM Marylen Ponto wrote: Reason for CRM: Pt stated he has been checking around but no pharmacy has the amphetamine-dextroamphetamine (ADDERALL) 10 MG tablet. Pt was told by his pharmacy that the medication is on back order with no eta. Pt requests call back.

## 2021-05-09 NOTE — Telephone Encounter (Signed)
Pt stated he doesn't want to switch to Vyvanse due to not being covered by insurance    Pt stated that the pharmacy advised to have the amphetamine-dextroamphetamine (ADDERALL) 20 MG tablet sent to them and he can cut them in half / please advise

## 2021-05-10 NOTE — Telephone Encounter (Signed)
Pt states the pharmacy had a shipment of the 10mg  tablets so he was able to get it filled.   Thanks,   -Mickel Baas

## 2021-05-18 ENCOUNTER — Ambulatory Visit: Payer: Self-pay

## 2021-05-18 NOTE — Telephone Encounter (Signed)
° ° °  Chief Complaint: Rectal bleeding Symptoms: Small amount of blood when wiping. Frequency: Today Pertinent Negatives: Patient denies abdominal pain Disposition: [] ED /[] Urgent Care (no appt availability in office) / [x] Appointment(In office/virtual)/ []  [facility]/ [] Home Care/ [] Refused Recommended Disposition /[] [facility]/ []  Follow-up with PCP Additional Notes: Instructed to call back for worsening of symptoms or go to ED.  Reason for Disposition  [1] Rectal bleeding is minimal (e.g., blood just on toilet paper, few drops, streaks on surface of normal formed BM) AND [2] bleeding recurs 3 or more times on treatment  Answer Assessment - Initial Assessment Questions 1. APPEARANCE of BLOOD: "What color is it?" "Is it passed separately, on the surface of the stool, or mixed in with the stool?"      Bright red 2. AMOUNT: "How much blood was passed?"      Small amount 3. FREQUENCY: "How many times has blood been passed with the stools?"      X 1 4. ONSET: "When was the blood first seen in the stools?" (Days or weeks)      Today 5. DIARRHEA: "Is there also some diarrhea?" If Yes, ask: "How many diarrhea stools in the past 24 hours?"      No 6. CONSTIPATION: "Do you have constipation?" If Yes, ask: "How bad is it?"     No 7. RECURRENT SYMPTOMS: "Have you had blood in your stools before?" If Yes, ask: "When was the last time?" and "What happened that time?"      Yes - 3 years ago 8. BLOOD THINNERS: "Do you take any blood thinners?" (e.g., Coumadin/warfarin, Pradaxa/dabigatran, aspirin)     No 9. OTHER SYMPTOMS: "Do you have any other symptoms?"  (e.g., abdomen pain, vomiting, dizziness, fever)     No 10. PREGNANCY: "Is there any chance you are pregnant?" "When was your last menstrual period?"       N/a  Protocols used: Rectal Bleeding-A-AH

## 2021-05-24 ENCOUNTER — Ambulatory Visit: Payer: BC Managed Care – PPO | Admitting: Internal Medicine

## 2021-05-24 NOTE — Progress Notes (Deleted)
? ?Subjective:  ? ? Patient ID: Peter Kramer, male    DOB: September 13, 1980, 41 y.o.   MRN: [id number] ? ?HPI ? ?Patient presents to clinic today with complaint of rectal bleeding. ? ?Review of Systems ? ?   ?Past Medical History:  ?Diagnosis Date  ? ADHD (attention deficit hyperactivity disorder)   ? Chicken pox   ? Essential hypertension 08/08/2016  ? Generalized anxiety disorder 07/17/2015  ? Genital herpes   ? Smoker 11/18/2014  ? ? ?Current Outpatient Medications  ?Medication Sig Dispense Refill  ? amphetamine-dextroamphetamine (ADDERALL XR) 20 MG 24 hr capsule Take 1 capsule (20 mg total) by mouth daily. 30 capsule 0  ? amphetamine-dextroamphetamine (ADDERALL) 10 MG tablet Take 1 tablet (10 mg total) by mouth daily after lunch. 30 tablet 0  ? ibuprofen (ADVIL) 800 MG tablet Take 1 tablet (800 mg total) by mouth every 8 (eight) hours as needed. 30 tablet 0  ? lisinopril (ZESTRIL) 10 MG tablet Take 1 tablet (10 mg total) by mouth daily. 30 tablet 6  ? ?No current facility-administered medications for this visit.  ? ? ?Allergies  ?Allergen Reactions  ? Bee Venom Swelling  ? ? ?Family History  ?Problem Relation Age of Onset  ? ADD / ADHD Father   ? Heart disease Maternal Grandmother   ? Cancer Paternal Grandfather   ?     brain  ? ADD / ADHD Sister   ? ADD / ADHD Sister   ? Diabetes Neg Hx   ? Stroke Neg Hx   ? ? ?Social History  ? ?Socioeconomic History  ? Marital status: Married  ?  Spouse name: emily  ? Number of children: 2  ? Years of education: Not on file  ? Highest education level: GED or equivalent  ?Occupational History  ? 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  Comment: full time  ?Tobacco Use  ? Smoking status: Every Day  ?  Packs/day: 1.00  ?  Years: 14.00  ?  Pack years: 14.00  ?  Types: Cigarettes  ? Smokeless tobacco: Never  ? Tobacco comments:  ?  patient relapsed  ?Vaping Use  ? Vaping Use: Never used  ?Substance and Sexual Activity  ? Alcohol use: No  ?  Alcohol/week: 0.0 standard drinks  ?  Comment: occasional  ?  Drug use: No  ? Sexual activity: Yes  ?  Birth control/protection: None  ?Other Topics Concern  ? Not on file  ?Social History Narrative  ? Not on file  ? ?Social Determinants of Health  ? ?Financial Resource Strain: Not on file  ?Food Insecurity: Not on file  ?Transportation Needs: Not on file  ?Physical Activity: Not on file  ?Stress: Not on file  ?Social Connections: Not on file  ?Intimate Partner Violence: Not on file  ? ? ? ?Constitutional: Denies fever, malaise, fatigue, headache or abrupt weight changes.  ?HEENT: Denies eye pain, eye redness, ear pain, ringing in the ears, wax buildup, runny nose, nasal congestion, bloody nose, or sore throat. ?Respiratory: Denies difficulty breathing, shortness of breath, cough or sputum production.   ?Cardiovascular: Denies chest pain, chest tightness, palpitations or swelling in the hands or feet.  ?Gastrointestinal: Patient reports BRBPR.  Denies abdominal pain, bloating, constipation, diarrhea.  ?GU: Denies urgency, frequency, pain with urination, burning sensation, blood in urine, odor or discharge. ?Musculoskeletal: Denies decrease in range of motion, difficulty with gait, muscle pain or joint pain and swelling.  ?Skin: Denies redness, rashes, lesions or ulcercations.  ?Neurological:  Denies dizziness, difficulty with memory, difficulty with speech or problems with balance and coordination.  ?Psych: Denies anxiety, depression, SI/HI. ? ?No other specific complaints in a complete review of systems (except as listed in HPI above). ? ?Objective:  ? Physical Exam ? ?There were no vitals taken for this visit. ?Wt Readings from Last 3 Encounters:  ?04/06/21 179 lb 6.4 oz (81.4 kg)  ?09/28/19 175 lb (79.4 kg)  ?09/03/19 176 lb (79.8 kg)  ? ? ?General: Appears their stated age, well developed, well nourished in NAD. ?Skin: Warm, dry and intact. No rashes, lesions or ulcerations noted. ?HEENT: Head: normal shape and size; Eyes: sclera white, no icterus, conjunctiva pink, PERRLA  and EOMs intact; Ears: Tm's gray and intact, normal light reflex; Nose: mucosa pink and moist, septum midline; Throat/Mouth: Teeth present, mucosa pink and moist, no exudate, lesions or ulcerations noted.  ?Neck:  Neck supple, trachea midline. No masses, lumps or thyromegaly present.  ?Cardiovascular: Normal rate and rhythm. S1,S2 noted.  No murmur, rubs or gallops noted. No JVD or BLE edema. No carotid bruits noted. ?Pulmonary/Chest: Normal effort and positive vesicular breath sounds. No respiratory distress. No wheezes, rales or ronchi noted.  ?Abdomen: Soft and nontender. Normal bowel sounds. No distention or masses noted. Liver, spleen and kidneys non palpable. ?Musculoskeletal: Normal range of motion. No signs of joint swelling. No difficulty with gait.  ?Neurological: Alert and oriented. Cranial nerves II-XII grossly intact. Coordination normal.  ?Psychiatric: Mood and affect normal. Behavior is normal. Judgment and thought content normal.  ? ? ?BMET ?   ?Component Value Date/Time  ? NA 136 10/05/2019 1334  ? K 4.4 10/05/2019 1334  ? CL 100 10/05/2019 1334  ? CO2 27 10/05/2019 1334  ? GLUCOSE 130 (H) 10/05/2019 1334  ? BUN 22 10/05/2019 1334  ? CREATININE 0.90 10/05/2019 1334  ? CALCIUM 9.6 10/05/2019 1334  ? GFRNONAA >60 01/08/2017 1239  ? GFRAA >60 01/08/2017 1239  ? ? ?Lipid Panel  ?   ?Component Value Date/Time  ? CHOL 179 10/05/2019 1334  ? TRIG 100.0 10/05/2019 1334  ? HDL 40.10 10/05/2019 1334  ? CHOLHDL 4 10/05/2019 1334  ? VLDL 20.0 10/05/2019 1334  ? LDLCALC 119 (H) 10/05/2019 1334  ? ? ?CBC ?   ?Component Value Date/Time  ? WBC 14.7 (H) 10/05/2019 1334  ? RBC 4.79 10/05/2019 1334  ? HGB 14.3 10/05/2019 1334  ? HCT 42.5 10/05/2019 1334  ? PLT 337.0 10/05/2019 1334  ? MCV 88.8 10/05/2019 1334  ? MCH 29.2 01/08/2017 1239  ? MCHC 33.6 10/05/2019 1334  ? RDW 13.8 10/05/2019 1334  ? ? ?Hgb A1C ?No results found for: HGBA1C ? ? ? ? ? ? ?   ?Assessment & Plan:  ? ? ?Nicki Reaper, NP ?This visit occurred  during the SARS-CoV-2 public health emergency.  Safety protocols were in place, including screening questions prior to the visit, additional usage of staff PPE, and extensive cleaning of exam room while observing appropriate contact time as indicated for disinfecting solutions.  ? ? ?

## 2021-05-29 ENCOUNTER — Ambulatory Visit: Payer: BC Managed Care – PPO | Admitting: Internal Medicine

## 2021-06-03 ENCOUNTER — Other Ambulatory Visit: Payer: Self-pay | Admitting: Internal Medicine

## 2021-06-04 MED ORDER — AMPHETAMINE-DEXTROAMPHETAMINE 10 MG PO TABS: 10.0000 mg | ORAL_TABLET | Freq: Every day | ORAL | 0 refills | Status: DC

## 2021-06-04 MED ORDER — AMPHETAMINE-DEXTROAMPHET ER 20 MG PO CP24: 20.0000 mg | ORAL_CAPSULE | Freq: Every day | ORAL | 0 refills | Status: DC

## 2021-07-02 ENCOUNTER — Other Ambulatory Visit: Payer: Self-pay | Admitting: Internal Medicine

## 2021-07-02 MED ORDER — AMPHETAMINE-DEXTROAMPHET ER 20 MG PO CP24: 20.0000 mg | ORAL_CAPSULE | Freq: Every day | ORAL | 0 refills | Status: DC

## 2021-07-02 MED ORDER — AMPHETAMINE-DEXTROAMPHETAMINE 10 MG PO TABS: 10.0000 mg | ORAL_TABLET | Freq: Every day | ORAL | 0 refills | Status: DC

## 2021-07-13 ENCOUNTER — Ambulatory Visit (INDEPENDENT_AMBULATORY_CARE_PROVIDER_SITE_OTHER): Payer: BC Managed Care – PPO | Admitting: Internal Medicine

## 2021-07-13 ENCOUNTER — Encounter: Payer: Self-pay | Admitting: Internal Medicine

## 2021-07-13 VITALS — BP 164/94 | HR 89 | Temp 98.0°F | Wt 179.0 lb

## 2021-07-13 DIAGNOSIS — K219 Gastro-esophageal reflux disease without esophagitis: Secondary | ICD-10-CM

## 2021-07-13 DIAGNOSIS — E7800 Pure hypercholesterolemia, unspecified: Secondary | ICD-10-CM

## 2021-07-13 DIAGNOSIS — A6001 Herpesviral infection of penis: Secondary | ICD-10-CM

## 2021-07-13 DIAGNOSIS — F4323 Adjustment disorder with mixed anxiety and depressed mood: Secondary | ICD-10-CM | POA: Diagnosis not present

## 2021-07-13 DIAGNOSIS — D729 Disorder of white blood cells, unspecified: Secondary | ICD-10-CM

## 2021-07-13 DIAGNOSIS — Z6828 Body mass index (BMI) 28.0-28.9, adult: Secondary | ICD-10-CM | POA: Insufficient documentation

## 2021-07-13 DIAGNOSIS — I10 Essential (primary) hypertension: Secondary | ICD-10-CM

## 2021-07-13 DIAGNOSIS — F988 Other specified behavioral and emotional disorders with onset usually occurring in childhood and adolescence: Secondary | ICD-10-CM

## 2021-07-13 DIAGNOSIS — Z6827 Body mass index (BMI) 27.0-27.9, adult: Secondary | ICD-10-CM | POA: Insufficient documentation

## 2021-07-13 DIAGNOSIS — G8929 Other chronic pain: Secondary | ICD-10-CM

## 2021-07-13 DIAGNOSIS — Z6826 Body mass index (BMI) 26.0-26.9, adult: Secondary | ICD-10-CM

## 2021-07-13 DIAGNOSIS — M25511 Pain in right shoulder: Secondary | ICD-10-CM

## 2021-07-13 DIAGNOSIS — E663 Overweight: Secondary | ICD-10-CM | POA: Insufficient documentation

## 2021-07-13 MED ORDER — CITALOPRAM HYDROBROMIDE 10 MG PO TABS: 10.0000 mg | ORAL_TABLET | Freq: Every day | ORAL | 2 refills | Status: DC

## 2021-07-13 NOTE — Assessment & Plan Note (Signed)
Will repeat CBC with annual exam ?

## 2021-07-13 NOTE — Assessment & Plan Note (Signed)
Will check lipid profile at annual exam ?Encouraged him to consume a low fat diet ?

## 2021-07-13 NOTE — Assessment & Plan Note (Signed)
No recent outbreak off prophylaxis ?

## 2021-07-13 NOTE — Assessment & Plan Note (Signed)
Try to avoid foods that trigger reflux ?Encourage weight loss as this can help reduce reflux symptoms ?Continue Pepto-Bismol OTC as needed ?

## 2021-07-13 NOTE — Assessment & Plan Note (Signed)
Elevated today, he is obviously distraught ?Continue Lisinopril 10 mg daily ?Reinforced DASH diet and exercise for weight loss ? ?RTC in 1 month for follow-up HTN, depression ?

## 2021-07-13 NOTE — Assessment & Plan Note (Signed)
Encourage diet and exercise for weight loss 

## 2021-07-13 NOTE — Patient Instructions (Signed)
Major Depressive Disorder, Adult Major depressive disorder is a mental health condition. This disorder affects feelings. It can also affect the body. Symptoms of this condition last most of the day, almost every day, for 2 weeks. This disorder can affect: Relationships. Daily activities, such as work and school. Activities that you normally like to do. What are the causes? The cause of this condition is not known. The disorder is likely caused by a mix of things, including: Your personality, such as being a shy person. Your behavior, or how you act toward others. Your thoughts and feelings. Too much alcohol or drugs. How you react to stress. Health and mental problems that you have had for a long time. Things that hurt you in the past (trauma). Big changes in your life, such as divorce. What increases the risk? The following factors may make you more likely to develop this condition: Having family members with depression. Being a woman. Problems in the family. Low levels of some brain chemicals. Things that caused you pain as a child, especially if you lost a parent or were abused. A lot of stress in your life, such as from: Living without basic needs of life, such as food and shelter. Being treated poorly because of race, sex, or religion (discrimination). Health and mental problems that you have had for a long time. What are the signs or symptoms? The main symptoms of this condition are: Being sad all the time. Being grouchy all the time. Loss of interest in things and activities. Other symptoms include: Sleeping too much or too little. Eating too much or too little. Gaining or losing weight, without knowing why. Feeling tired or having low energy. Being restless and weak. Feeling hopeless, worthless, or guilty. Trouble thinking clearly or making decisions. Thoughts of hurting yourself or others, or thoughts of ending your life. Spending a lot of time alone. Inability to  complete common tasks of daily life. If you have very bad MDD, you may: Believe things that are not true. Hear, see, taste, or feel things that are not there. Have mild depression that lasts for at least 2 years. Feel very sad and hopeless. Have trouble speaking or moving. How is this treated? This condition may be treated with: Talk therapy. This teaches you to know bad thoughts, feelings, and actions and how to change them. This can also help you to communicate with others. This can be done with members of your family. Medicines. These can be used to treat worry (anxiety), depression, or low levels of chemicals in the brain. Lifestyle changes. You may need to: Limit alcohol use. Limit drug use. Get regular exercise. Get plenty of sleep. Make healthy eating choices. Spend more time outdoors. Brain stimulation. This treatment excites the brain. This is done when symptoms are very bad or have not gotten better with other treatments. Follow these instructions at home: Activity Get regular exercise as told. Spend time outdoors as told. Make time to do the things you enjoy. Find ways to deal with stress. Try to: Meditate. Do deep breathing. Spend time in nature. Keep a journal. Return to your normal activities as told by your doctor. Ask your doctor what activities are safe for you. Alcohol and drug use If you drink alcohol: Limit how much you use to: 0-1 drink a day for women. 0-2 drinks a day for men. Be aware of how much alcohol is in your drink. In the [place] one drink equals one 12 oz bottle of beer (355 mL),   one 5 oz glass of wine (148 mL), or one 1 oz glass of hard liquor (44 mL). Talk to your doctor about: Alcohol use. Alcohol can affect some medicines. Any drug use. General instructions  Take over-the-counter and prescription medicines and herbal preparations only as told by your doctor. Eat a healthy diet. Get a lot of sleep. Think about joining a support group.  Your doctor may be able to suggest one. Keep all follow-up visits as told by your doctor. This is important. Where to find more information: National Alliance on Mental Illness: www.nami.org [place] National Institute of Mental Health: www.nimh.nih.gov American Psychiatric Association: www.psychiatry.org/patients-families/ Contact a doctor if: Your symptoms get worse. You get new symptoms. Get help right away if: You hurt yourself. You have serious thoughts about hurting yourself or others. You see, hear, taste, smell, or feel things that are not there. If you ever feel like you may hurt yourself or others, or have thoughts about taking your own life, get help right away. Go to your nearest emergency department or: Call your local emergency services (911 in the [place] Call a suicide crisis helpline, such as the National Suicide Prevention Lifeline at 1-800-273-8255 or 988 in the [place] This is open 24 hours a day in the [place] Text the Crisis Text Line at 741741 (in the [place] Summary Major depressive disorder is a mental health condition. This disorder affects feelings. Symptoms of this condition last most of the day, almost every day, for 2 weeks. The symptoms of this disorder can cause problems with relationships and with daily activities. There are treatments and support for people who get this disorder. You may need more than one type of treatment. Get help right away if you have serious thoughts about hurting yourself or others. This information is not intended to replace advice given to you by your health care provider. Make sure you discuss any questions you have with your health care provider. Document Revised: 10/04/2020 Document Reviewed: 02/20/2019 Elsevier Patient Education  2023 Elsevier Inc.  

## 2021-07-13 NOTE — Assessment & Plan Note (Signed)
Deteriorated ?Rx for Citalopram 10 mg daily-side effects discussed ?Referral to psychology placed for CBT therapy ?Support offered ?

## 2021-07-13 NOTE — Assessment & Plan Note (Signed)
Continue current dose of Adderall ?

## 2021-07-13 NOTE — Assessment & Plan Note (Signed)
Encouraged shoulder exercises daily ?Continue Ibuprofen OTC as needed ?

## 2021-07-13 NOTE — Progress Notes (Signed)
? ?Subjective:  ? ? Patient ID: Peter Kramer, male    DOB: Mar 03, 1981, 41 y.o.   MRN: [id number] ? ?HPI ? ?Pt presents to the clinic today for follow up of chronic conditions. ? ?HTN: His BP today is 164/94.  He is taking Lisinopril as prescribed. He feels like his blood pressure is increased because of stress. There is no ECG on file. ? ?GERD: Triggered by, random foods. He takes Pepto Bismol as needed with good relief of symptoms.  There is no upper GI on file. ? ?Genital Herpes: He denies recent outbreak.  He is not currently taking any prophylactic medication for this. ? ?Anxiety and Depression: Deteriorated secondary to recent separation.  He is feeling more depressed. He has been crying and unable to stop. He is not currently taking any medications for anxiety at this time but he does think he needs something for this.  He is not currently seeing a therapist.  He denies depression, SI/HI. ? ?ADHD: Mainly.  He feels like his symptoms are well controlled on his current dose of Adderall.  He does not follow with psychiatry. ? ?Chronic Right Shoulder Pain: Persistent. He takes Ibuprofen as needed with some relief of symptoms. He does not follow with orthopedics. ? ?HLD: His last LDL was 119, triglycerides 100, 09/2019.  He is not taking any cholesterol-lowering medication at this time.  He does not consume a low-fat diet. ? ?Neutrophilia: His last WBC count was 14.7, 09/2019.  He does smoke.  He is not following with hematology. ? ?Review of Systems ? ?   ?Past Medical History:  ?Diagnosis Date  ? ADHD (attention deficit hyperactivity disorder)   ? Chicken pox   ? Essential hypertension 08/08/2016  ? Generalized anxiety disorder 07/17/2015  ? Genital herpes   ? Smoker 11/18/2014  ? ? ?Current Outpatient Medications  ?Medication Sig Dispense Refill  ? amphetamine-dextroamphetamine (ADDERALL XR) 20 MG 24 hr capsule Take 1 capsule (20 mg total) by mouth daily. 30 capsule 0  ? amphetamine-dextroamphetamine (ADDERALL) 10  MG tablet Take 1 tablet (10 mg total) by mouth daily after lunch. 30 tablet 0  ? ibuprofen (ADVIL) 800 MG tablet Take 1 tablet (800 mg total) by mouth every 8 (eight) hours as needed. 30 tablet 0  ? lisinopril (ZESTRIL) 10 MG tablet Take 1 tablet (10 mg total) by mouth daily. 30 tablet 6  ? ?No current facility-administered medications for this visit.  ? ? ?Allergies  ?Allergen Reactions  ? Bee Venom Swelling  ? ? ?Family History  ?Problem Relation Age of Onset  ? ADD / ADHD Father   ? Heart disease Maternal Grandmother   ? Cancer Paternal Grandfather   ?     brain  ? ADD / ADHD Sister   ? ADD / ADHD Sister   ? Diabetes Neg Hx   ? Stroke Neg Hx   ? ? ?Social History  ? ?Socioeconomic History  ? Marital status: Married  ?  Spouse name: emily  ? Number of children: 2  ? Years of education: Not on file  ? Highest education level: GED or equivalent  ?Occupational History  ? 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  Comment: full time  ?Tobacco Use  ? Smoking status: Every Day  ?  Packs/day: 1.00  ?  Years: 14.00  ?  Pack years: 14.00  ?  Types: Cigarettes  ? Smokeless tobacco: Never  ? Tobacco comments:  ?  patient relapsed  ?Vaping Use  ? Vaping  Use: Never used  ?Substance and Sexual Activity  ? Alcohol use: No  ?  Alcohol/week: 0.0 standard drinks  ?  Comment: occasional  ? Drug use: No  ? Sexual activity: Yes  ?  Birth control/protection: None  ?Other Topics Concern  ? Not on file  ?Social History Narrative  ? Not on file  ? ?Social Determinants of Health  ? ?Financial Resource Strain: Not on file  ?Food Insecurity: Not on file  ?Transportation Needs: Not on file  ?Physical Activity: Not on file  ?Stress: Not on file  ?Social Connections: Not on file  ?Intimate Partner Violence: Not on file  ? ? ? ?Constitutional: Denies fever, malaise, fatigue, headache or abrupt weight changes.  ?HEENT: Denies eye pain, eye redness, ear pain, ringing in the ears, wax buildup, runny nose, nasal congestion, bloody nose, or sore  throat. ?Respiratory: Denies difficulty breathing, shortness of breath, cough or sputum production.   ?Cardiovascular: Denies chest pain, chest tightness, palpitations or swelling in the hands or feet.  ?Gastrointestinal: Denies abdominal pain, bloating, constipation, diarrhea or blood in the stool.  ?GU: Denies urgency, frequency, pain with urination, burning sensation, blood in urine, odor or discharge. ?Musculoskeletal: Patient reports chronic right shoulder pain.  Denies decrease in range of motion, difficulty with gait, muscle pain or joint swelling.  ?Skin: Denies redness, rashes, lesions or ulcercations.  ?Neurological: Denies dizziness, difficulty with memory, difficulty with speech or problems with balance and coordination.  ?Psych: Patient reports anxiety and depression.  Denies SI/HI. ? ?No other specific complaints in a complete review of systems (except as listed in HPI above). ? ?Objective:  ? Physical Exam ?BP (!) 164/94 (BP Location: Right Arm, Patient Position: Sitting, Cuff Size: Normal)   Pulse 89   Temp 98 ?F (36.7 ?C) (Temporal)   Wt 179 lb (81.2 kg)   SpO2 99%   BMI 26.24 kg/m?  ? ?Wt Readings from Last 3 Encounters:  ?04/06/21 179 lb 6.4 oz (81.4 kg)  ?09/28/19 175 lb (79.4 kg)  ?09/03/19 176 lb (79.8 kg)  ? ? ?General: Appears his stated age, overweight, in NAD. ? ?HEENT: Head: normal shape and size; Eyes: sclera white and EOMs intact;  ?Neck:  Neck supple, trachea midline. No masses, lumps or thyromegaly present.  ?Cardiovascular: Normal rate. ?Pulmonary/Chest: Normal effort. ?Musculoskeletal: No difficulty with gait.  ?Neurological: Alert and oriented.  ?Psychiatric: Tearful.  Judgment and thought content normal.  ? ?BMET ?   ?Component Value Date/Time  ? NA 136 10/05/2019 1334  ? K 4.4 10/05/2019 1334  ? CL 100 10/05/2019 1334  ? CO2 27 10/05/2019 1334  ? GLUCOSE 130 (H) 10/05/2019 1334  ? BUN 22 10/05/2019 1334  ? CREATININE 0.90 10/05/2019 1334  ? CALCIUM 9.6 10/05/2019 1334  ?  GFRNONAA >60 01/08/2017 1239  ? GFRAA >60 01/08/2017 1239  ? ? ?Lipid Panel  ?   ?Component Value Date/Time  ? CHOL 179 10/05/2019 1334  ? TRIG 100.0 10/05/2019 1334  ? HDL 40.10 10/05/2019 1334  ? CHOLHDL 4 10/05/2019 1334  ? VLDL 20.0 10/05/2019 1334  ? LDLCALC 119 (H) 10/05/2019 1334  ? ? ?CBC ?   ?Component Value Date/Time  ? WBC 14.7 (H) 10/05/2019 1334  ? RBC 4.79 10/05/2019 1334  ? HGB 14.3 10/05/2019 1334  ? HCT 42.5 10/05/2019 1334  ? PLT 337.0 10/05/2019 1334  ? MCV 88.8 10/05/2019 1334  ? MCH 29.2 01/08/2017 1239  ? MCHC 33.6 10/05/2019 1334  ? RDW 13.8 10/05/2019 1334  ? ? ?  Hgb A1C ?No results found for: HGBA1C ? ? ? ? ? ?   ?Assessment & Plan:  ? ? ? ?Nicki Reaper, NP ? ?

## 2021-07-17 ENCOUNTER — Encounter: Payer: Self-pay | Admitting: Internal Medicine

## 2021-07-29 ENCOUNTER — Other Ambulatory Visit: Payer: Self-pay | Admitting: Internal Medicine

## 2021-07-30 MED ORDER — AMPHETAMINE-DEXTROAMPHET ER 20 MG PO CP24: 20.0000 mg | ORAL_CAPSULE | Freq: Every day | ORAL | 0 refills | Status: DC

## 2021-07-30 MED ORDER — AMPHETAMINE-DEXTROAMPHETAMINE 10 MG PO TABS: 10.0000 mg | ORAL_TABLET | Freq: Every day | ORAL | 0 refills | Status: DC

## 2021-08-21 MED ORDER — CITALOPRAM HYDROBROMIDE 20 MG PO TABS: 20.0000 mg | ORAL_TABLET | Freq: Every day | ORAL | 0 refills | Status: DC

## 2021-08-27 ENCOUNTER — Other Ambulatory Visit: Payer: Self-pay | Admitting: Internal Medicine

## 2021-08-27 MED ORDER — AMPHETAMINE-DEXTROAMPHET ER 20 MG PO CP24: 20.0000 mg | ORAL_CAPSULE | Freq: Every day | ORAL | 0 refills | Status: DC

## 2021-08-27 MED ORDER — AMPHETAMINE-DEXTROAMPHETAMINE 10 MG PO TABS: 10.0000 mg | ORAL_TABLET | Freq: Every day | ORAL | 0 refills | Status: DC

## 2021-08-31 ENCOUNTER — Telehealth: Payer: Self-pay

## 2021-08-31 NOTE — Telephone Encounter (Signed)
Please review.  Pt would like to these today.      amphetamine-dextroamphetamine (ADDERALL XR) 20 MG 24 hr capsule 30 capsule 0 08/27/2021    Sig - Route: Take 1 capsule (20 mg total) by mouth daily. - Oral   Sent to pharmacy as: amphetamine-dextroamphetamine (ADDERALL XR) 20 MG 24 hr capsule   Earliest Fill Date: 08/27/2021   Notes to Pharmacy: Fill on or after 09/01/2021   E-Prescribing Status: Receipt confirmed by pharmacy (08/27/2021 11:00 AM EDT)    amphetamine-dextroamphetamine (ADDERALL) 10 MG tablet 30 tablet 0 08/27/2021    Sig - Route: Take 1 tablet (10 mg total) by mouth daily after lunch. - Oral   Sent to pharmacy as: amphetamine-dextroamphetamine (ADDERALL) 10 MG tablet   Earliest Fill Date: 08/27/2021   Notes to Pharmacy: Fill on or after 09/01/2021   E-Prescribing Status: Receipt confirmed by pharmacy (08/27/2021 11:00 AM EDT)

## 2021-08-31 NOTE — Telephone Encounter (Signed)
They were sent in to be filled tomorrow. That is when they are due.

## 2021-08-31 NOTE — Telephone Encounter (Signed)
Copied from CRM [id number]. Topic: General - Other >> Aug 31, 2021 12:08 PM Lynford Citizen wrote: Reason for CRM: Pt called in wanting an early refill of amphetamine-dextroamphetamine (ADDERALL), pt would like it to be filled today, please advise.

## 2021-09-24 ENCOUNTER — Other Ambulatory Visit: Payer: Self-pay | Admitting: Internal Medicine

## 2021-09-26 MED ORDER — AMPHETAMINE-DEXTROAMPHET ER 20 MG PO CP24: 20.0000 mg | ORAL_CAPSULE | Freq: Every day | ORAL | 0 refills | Status: DC

## 2021-09-26 MED ORDER — AMPHETAMINE-DEXTROAMPHETAMINE 10 MG PO TABS: 10.0000 mg | ORAL_TABLET | Freq: Every day | ORAL | 0 refills | Status: DC

## 2021-10-07 ENCOUNTER — Other Ambulatory Visit: Payer: Self-pay | Admitting: Internal Medicine

## 2021-10-07 DIAGNOSIS — I10 Essential (primary) hypertension: Secondary | ICD-10-CM

## 2021-10-09 NOTE — Telephone Encounter (Signed)
Requested medication (s) are due for refill today: yes  Requested medication (s) are on the active medication list: yes  Last refill:  04/05/21 #30 6 RF  Future visit scheduled: yes (pt stated he forgot to schedule the f/u- called pt and scheduled appt  Notes to clinic:  overdue lab work   Requested Prescriptions  Pending Prescriptions Disp Refills   lisinopril (ZESTRIL) 10 MG tablet [Pharmacy Med Name: Lisinopril 10 MG Oral Tablet] 90 tablet 0    Sig: Take 1 tablet by mouth once daily     Cardiovascular:  ACE Inhibitors Failed - 10/07/2021 12:03 PM      Failed - Cr in normal range and within 180 days    Creatinine, Ser  Date Value Ref Range Status  10/05/2019 0.90 0.40 - 1.50 mg/dL Final         Failed - K in normal range and within 180 days    Potassium  Date Value Ref Range Status  10/05/2019 4.4 3.5 - 5.1 mEq/L Final         Failed - Last BP in normal range    BP Readings from Last 1 Encounters:  07/13/21 (!) 164/94         Passed - Patient is not pregnant      Passed - Valid encounter within last 6 months    Recent Outpatient Visits           2 months ago Primary hypertension   [facility] [place], Salvadore Oxford, NP   6 months ago Closed nondisplaced fracture of distal phalanx of left index finger, initial encounter   [facility] [place], [place], NP   9 months ago Acute bacterial sinusitis   [facility] [place], Salvadore Oxford, NP       Future Appointments             In 2 weeks Sampson Si, Salvadore Oxford, NP [facility], [facility]

## 2021-10-11 ENCOUNTER — Other Ambulatory Visit: Payer: Self-pay | Admitting: Internal Medicine

## 2021-10-12 ENCOUNTER — Encounter: Payer: Self-pay | Admitting: Internal Medicine

## 2021-10-12 ENCOUNTER — Other Ambulatory Visit: Payer: Self-pay | Admitting: Internal Medicine

## 2021-10-12 MED ORDER — CITALOPRAM HYDROBROMIDE 40 MG PO TABS: 40.0000 mg | ORAL_TABLET | Freq: Every day | ORAL | 1 refills | Status: DC

## 2021-10-12 NOTE — Telephone Encounter (Signed)
The refill is too soon.    citalopram (CELEXA) 20 MG tablet 90 tablet 0 08/21/2021    Sig - Route: Take 1 tablet (20 mg total) by mouth daily. - Oral   Sent to pharmacy as: citalopram (CELEXA) 20 MG tablet   E-Prescribing Status: Receipt confirmed by pharmacy (08/21/2021 12:26 PM EDT)

## 2021-10-23 ENCOUNTER — Ambulatory Visit (INDEPENDENT_AMBULATORY_CARE_PROVIDER_SITE_OTHER): Payer: BC Managed Care – PPO | Admitting: Internal Medicine

## 2021-10-23 ENCOUNTER — Encounter: Payer: Self-pay | Admitting: Internal Medicine

## 2021-10-23 VITALS — BP 132/88 | HR 103 | Temp 98.2°F | Ht 70.0 in | Wt 162.0 lb

## 2021-10-23 DIAGNOSIS — Z114 Encounter for screening for human immunodeficiency virus [HIV]: Secondary | ICD-10-CM

## 2021-10-23 DIAGNOSIS — F988 Other specified behavioral and emotional disorders with onset usually occurring in childhood and adolescence: Secondary | ICD-10-CM

## 2021-10-23 DIAGNOSIS — Z1159 Encounter for screening for other viral diseases: Secondary | ICD-10-CM

## 2021-10-23 DIAGNOSIS — F4323 Adjustment disorder with mixed anxiety and depressed mood: Secondary | ICD-10-CM

## 2021-10-23 DIAGNOSIS — Z0001 Encounter for general adult medical examination with abnormal findings: Secondary | ICD-10-CM

## 2021-10-23 DIAGNOSIS — L723 Sebaceous cyst: Secondary | ICD-10-CM

## 2021-10-23 MED ORDER — BUPROPION HCL ER (XL) 150 MG PO TB24: 150.0000 mg | ORAL_TABLET | Freq: Every day | ORAL | 1 refills | Status: DC

## 2021-10-23 NOTE — Assessment & Plan Note (Signed)
Deteriorated Continue citalopram We will add Wellbutrin 150 mg XL daily Support offered

## 2021-10-23 NOTE — Patient Instructions (Signed)
Health Maintenance, Male Adopting a healthy lifestyle and getting preventive care are important in promoting health and wellness. Ask your health care provider about: The right schedule for you to have regular tests and exams. Things you can do on your own to prevent diseases and keep yourself healthy. What should I know about diet, weight, and exercise? Eat a healthy diet  Eat a diet that includes plenty of vegetables, fruits, low-fat dairy products, and lean protein. Do not eat a lot of foods that are high in solid fats, added sugars, or sodium. Maintain a healthy weight Body mass index (BMI) is a measurement that can be used to identify possible weight problems. It estimates body fat based on height and weight. Your health care provider can help determine your BMI and help you achieve or maintain a healthy weight. Get regular exercise Get regular exercise. This is one of the most important things you can do for your health. Most adults should: Exercise for at least 150 minutes each week. The exercise should increase your heart rate and make you sweat (moderate-intensity exercise). Do strengthening exercises at least twice a week. This is in addition to the moderate-intensity exercise. Spend less time sitting. Even light physical activity can be beneficial. Watch cholesterol and blood lipids Have your blood tested for lipids and cholesterol at 39 years of age, then have this test every 5 years. You may need to have your cholesterol levels checked more often if: Your lipid or cholesterol levels are high. You are older than 40 years of age. You are at high risk for heart disease. What should I know about cancer screening? Many types of cancers can be detected early and may often be prevented. Depending on your health history and family history, you may need to have cancer screening at various ages. This may include screening for: Colorectal cancer. Prostate cancer. Skin cancer. Lung  cancer. What should I know about heart disease, diabetes, and high blood pressure? Blood pressure and heart disease High blood pressure causes heart disease and increases the risk of stroke. This is more likely to develop in people who have high blood pressure readings or are overweight. Talk with your health care provider about your target blood pressure readings. Have your blood pressure checked: Every 3-5 years if you are 18-39 years of age. Every year if you are 40 years old or older. If you are between the ages of 65 and 75 and are a current or former smoker, ask your health care provider if you should have a one-time screening for abdominal aortic aneurysm (AAA). Diabetes Have regular diabetes screenings. This checks your fasting blood sugar level. Have the screening done: Once every three years after age 45 if you are at a normal weight and have a low risk for diabetes. More often and at a younger age if you are overweight or have a high risk for diabetes. What should I know about preventing infection? Hepatitis B If you have a higher risk for hepatitis B, you should be screened for this virus. Talk with your health care provider to find out if you are at risk for hepatitis B infection. Hepatitis C Blood testing is recommended for: Everyone born from 1945 through 1965. Anyone with known risk factors for hepatitis C. Sexually transmitted infections (STIs) You should be screened each year for STIs, including gonorrhea and chlamydia, if: You are sexually active and are younger than 39 years of age. You are older than 39 years of age and your   health care provider tells you that you are at risk for this type of infection. Your sexual activity has changed since you were last screened, and you are at increased risk for chlamydia or gonorrhea. Ask your health care provider if you are at risk. Ask your health care provider about whether you are at high risk for HIV. Your health care provider  may recommend a prescription medicine to help prevent HIV infection. If you choose to take medicine to prevent HIV, you should first get tested for HIV. You should then be tested every 3 months for as long as you are taking the medicine. Follow these instructions at home: Alcohol use Do not drink alcohol if your health care provider tells you not to drink. If you drink alcohol: Limit how much you have to 0-2 drinks a day. Know how much alcohol is in your drink. In the [place] one drink equals one 12 oz bottle of beer (355 mL), one 5 oz glass of wine (148 mL), or one 1 oz glass of hard liquor (44 mL). Lifestyle Do not use any products that contain nicotine or tobacco. These products include cigarettes, chewing tobacco, and vaping devices, such as e-cigarettes. If you need help quitting, ask your health care provider. Do not use street drugs. Do not share needles. Ask your health care provider for help if you need support or information about quitting drugs. General instructions Schedule regular health, dental, and eye exams. Stay current with your vaccines. Tell your health care provider if: You often feel depressed. You have ever been abused or do not feel safe at home. Summary Adopting a healthy lifestyle and getting preventive care are important in promoting health and wellness. Follow your health care provider's instructions about healthy diet, exercising, and getting tested or screened for diseases. Follow your health care provider's instructions on monitoring your cholesterol and blood pressure. This information is not intended to replace advice given to you by your health care provider. Make sure you discuss any questions you have with your health care provider. Document Revised: 07/31/2020 Document Reviewed: 07/31/2020 Elsevier Patient Education  2023 Elsevier Inc.  

## 2021-10-23 NOTE — Assessment & Plan Note (Signed)
We will stop evening dose of Adderall 10 mg daily He will try to wean his 20 mg so that he can come off of this as this is likely making his anxiety worse

## 2021-10-23 NOTE — Progress Notes (Signed)
Subjective:    Patient ID: Peter Kramer, male    DOB: 30-Jun-1980, 41 y.o.   MRN: [id number]  HPI  Patient presents to clinic today for his annual exam.  Flu: never Tetanus: 05/2018 COVID: x1  Vision screening: annually Dentist: has dentures  Diet: He does eat meat. He consumes fruits and veggies. He tries to avoid fried foods. He drinking mostly water. Exercise: Lift weights, ride bike  Review of Systems     Past Medical History:  Diagnosis Date   ADHD (attention deficit hyperactivity disorder)    Chicken pox    Essential hypertension 08/08/2016   Generalized anxiety disorder 07/17/2015   Genital herpes    Smoker 11/18/2014    Current Outpatient Medications  Medication Sig Dispense Refill   amphetamine-dextroamphetamine (ADDERALL XR) 20 MG 24 hr capsule Take 1 capsule (20 mg total) by mouth daily. 30 capsule 0   amphetamine-dextroamphetamine (ADDERALL) 10 MG tablet Take 1 tablet (10 mg total) by mouth daily after lunch. 30 tablet 0   citalopram (CELEXA) 40 MG tablet Take 1 tablet (40 mg total) by mouth daily. 90 tablet 1   ibuprofen (ADVIL) 800 MG tablet Take 1 tablet (800 mg total) by mouth every 8 (eight) hours as needed. 30 tablet 0   lisinopril (ZESTRIL) 10 MG tablet Take 1 tablet by mouth once daily 90 tablet 0   No current facility-administered medications for this visit.    Allergies  Allergen Reactions   Bee Venom Swelling    Family History  Problem Relation Age of Onset   ADD / ADHD Father    Heart disease Maternal Grandmother    Cancer Paternal Grandfather        brain   ADD / ADHD Sister    ADD / ADHD Sister    Diabetes Neg Hx    Stroke Neg Hx     Social History   Socioeconomic History   Marital status: Married    Spouse name: emily   Number of children: 2   Years of education: Not on file   Highest education level: GED or equivalent  Occupational History   Occupation: [employer]    Comment: full time  Tobacco Use   Smoking status:  Former    Packs/day: 1.00    Years: 14.00    Total pack years: 14.00    Types: Cigarettes   Smokeless tobacco: Never   Tobacco comments:    patient relapsed  Vaping Use   Vaping Use: Never used  Substance and Sexual Activity   Alcohol use: No    Alcohol/week: 0.0 standard drinks of alcohol    Comment: occasional   Drug use: No   Sexual activity: Yes    Birth control/protection: None  Other Topics Concern   Not on file  Social History Narrative   Not on file   Social Determinants of Health   Financial Resource Strain: High Risk (01/27/2017)   Overall Financial Resource Strain (CARDIA)    Difficulty of Paying Living Expenses: Very hard  Food Insecurity: Food Insecurity Present (01/27/2017)   Hunger Vital Sign    Worried About Running Out of Food in the Last Year: Sometimes true    Ran Out of Food in the Last Year: Sometimes true  Transportation Needs: No Transportation Needs (01/27/2017)   PRAPARE - Hydrologist (Medical): No    Lack of Transportation (Non-Medical): No  Physical Activity: Sufficiently Active (01/27/2017)   Exercise Vital Sign  Days of Exercise per Week: 3 days    Minutes of Exercise per Session: 120 min  Stress: Stress Concern Present (01/27/2017)   [place]    Feeling of Stress : To some extent  Social Connections: Somewhat Isolated (01/27/2017)   Social Connection and Isolation Panel [NHANES]    Frequency of Communication with Friends and Family: More than three times a week    Frequency of Social Gatherings with Friends and Family: Once a week    Attends Religious Services: Never    Marine scientist or Organizations: No    Attends Archivist Meetings: Never    Marital Status: Married  Human resources officer Violence: Not At Risk (01/27/2017)   Humiliation, Afraid, Rape, and Kick questionnaire    Fear of Current or Ex-Partner: No    Emotionally  Abused: No    Physically Abused: No    Sexually Abused: No     Constitutional: Denies fever, malaise, fatigue, headache or abrupt weight changes.  HEENT: Denies eye pain, eye redness, ear pain, ringing in the ears, wax buildup, runny nose, nasal congestion, bloody nose, or sore throat. Respiratory: Denies difficulty breathing, shortness of breath, cough or sputum production.   Cardiovascular: Denies chest pain, chest tightness, palpitations or swelling in the hands or feet.  Gastrointestinal: Denies abdominal pain, bloating, constipation, diarrhea or blood in the stool.  GU: Denies urgency, frequency, pain with urination, burning sensation, blood in urine, odor or discharge. Musculoskeletal: Patient reports chronic right shoulder pain.  Denies decrease in range of motion, difficulty with gait, muscle pain or joint swelling.  Skin: Denies redness, rashes, lesions or ulcercations.  Neurological: Patient reports inattention.  Denies dizziness, difficulty with memory, difficulty with speech or problems with balance and coordination.  Psych: Patient has a history of anxiety and depression.  Denies SI/HI.  No other specific complaints in a complete review of systems (except as listed in HPI above).  Objective:   Physical Exam  BP 132/88 (BP Location: Right Arm, Patient Position: Sitting, Cuff Size: Normal)   Pulse (!) 103   Temp 98.2 F (36.8 C) (Temporal)   Ht _0  (1.778 m)   Wt 162 lb (73.5 kg)   SpO2 99%   BMI 23.24 kg/m   Wt Readings from Last 3 Encounters:  07/13/21 179 lb (81.2 kg)  04/06/21 179 lb 6.4 oz (81.4 kg)  09/28/19 175 lb (79.4 kg)    General: Appears his stated age, well developed, well nourished in NAD. Skin: Warm, dry and intact.  1 cm sebaceous cyst noted on the left side of neck. HEENT: Head: normal shape and size; Eyes: sclera white, no icterus, conjunctiva pink, PERRLA and EOMs intact;  Neck:  Neck supple, trachea midline. No masses, lumps or thyromegaly  present.  Cardiovascular: Tachycardic with normal rhythm. S1,S2 noted.  No murmur, rubs or gallops noted. No JVD or BLE edema. No carotid bruits noted. Pulmonary/Chest: Normal effort and positive vesicular breath sounds. No respiratory distress. No wheezes, rales or ronchi noted.  Abdomen:  Normal bowel sounds.  Musculoskeletal: Strength 5/5 BUE/BLE.  No difficulty with gait.  Neurological: Alert and oriented. Cranial nerves II-XII grossly intact. Coordination normal.  Psychiatric: Mood and affect normal.  Anxious appearing.  Judgment and thought content normal.    BMET    Component Value Date/Time   NA 136 10/05/2019 1334   K 4.4 10/05/2019 1334   CL 100 10/05/2019 1334   CO2 27 10/05/2019  1334   GLUCOSE 130 (H) 10/05/2019 1334   BUN 22 10/05/2019 1334   CREATININE 0.90 10/05/2019 1334   CALCIUM 9.6 10/05/2019 1334   GFRNONAA >60 01/08/2017 1239   GFRAA >60 01/08/2017 1239    Lipid Panel     Component Value Date/Time   CHOL 179 10/05/2019 1334   TRIG 100.0 10/05/2019 1334   HDL 40.10 10/05/2019 1334   CHOLHDL 4 10/05/2019 1334   VLDL 20.0 10/05/2019 1334   LDLCALC 119 (H) 10/05/2019 1334    CBC    Component Value Date/Time   WBC 14.7 (H) 10/05/2019 1334   RBC 4.79 10/05/2019 1334   HGB 14.3 10/05/2019 1334   HCT 42.5 10/05/2019 1334   PLT 337.0 10/05/2019 1334   MCV 88.8 10/05/2019 1334   MCH 29.2 01/08/2017 1239   MCHC 33.6 10/05/2019 1334   RDW 13.8 10/05/2019 1334    Hgb A1C No results found for: "HGBA1C"          Assessment & Plan:   Preventative Health Maintenance:  Encouraged him to get a flu shot in the fall Tetanus UTD Encouraged him to get his COVID-vaccine Encouraged him to consume a balanced diet and exercise regimen Advised him to see an eye doctor and dentist annually We will check CBC, c-Met, lipid, A1c, HIV and hep C today  RTC in 6 months, follow-up chronic conditions Webb Silversmith, NP

## 2021-11-12 MED ORDER — VALACYCLOVIR HCL 500 MG PO TABS: 500.0000 mg | ORAL_TABLET | Freq: Two times a day (BID) | ORAL | 0 refills | Status: DC | PRN

## 2021-11-12 MED ORDER — AMPHETAMINE-DEXTROAMPHET ER 20 MG PO CP24
20.0000 mg | ORAL_CAPSULE | Freq: Every day | ORAL | 0 refills | Status: DC
Start: 2021-11-12 — End: 2021-12-06

## 2021-12-06 ENCOUNTER — Other Ambulatory Visit: Payer: Self-pay | Admitting: Internal Medicine

## 2021-12-06 MED ORDER — AMPHETAMINE-DEXTROAMPHETAMINE 10 MG PO TABS: 10.0000 mg | ORAL_TABLET | Freq: Every day | ORAL | 0 refills | Status: DC

## 2021-12-06 MED ORDER — AMPHETAMINE-DEXTROAMPHET ER 20 MG PO CP24: 20.0000 mg | ORAL_CAPSULE | Freq: Every day | ORAL | 0 refills | Status: DC

## 2021-12-06 NOTE — Addendum Note (Signed)
Addended by: Lorre Munroe on: 12/06/2021 01:17 PM   Modules accepted: Orders

## 2021-12-13 ENCOUNTER — Other Ambulatory Visit: Payer: Self-pay | Admitting: Internal Medicine

## 2021-12-14 ENCOUNTER — Other Ambulatory Visit: Payer: Self-pay | Admitting: Internal Medicine

## 2021-12-31 ENCOUNTER — Other Ambulatory Visit: Payer: Self-pay | Admitting: Internal Medicine

## 2021-12-31 DIAGNOSIS — I10 Essential (primary) hypertension: Secondary | ICD-10-CM

## 2022-01-01 NOTE — Telephone Encounter (Signed)
Requested medication (s) are due for refill today: yes  Requested medication (s) are on the active medication list: yes  Last refill:  10/09/21 #90/0  Future visit scheduled: no  Notes to clinic:  pt is needing updated labs and had annual 2 months ago. Please advise     Requested Prescriptions  Pending Prescriptions Disp Refills   lisinopril (ZESTRIL) 10 MG tablet [Pharmacy Med Name: Lisinopril 10 MG Oral Tablet] 90 tablet 0    Sig: Take 1 tablet by mouth once daily     Cardiovascular:  ACE Inhibitors Failed - 12/31/2021  4:04 PM      Failed - Cr in normal range and within 180 days    Creatinine, Ser  Date Value Ref Range Status  10/05/2019 0.90 0.40 - 1.50 mg/dL Final         Failed - K in normal range and within 180 days    Potassium  Date Value Ref Range Status  10/05/2019 4.4 3.5 - 5.1 mEq/L Final         Passed - Patient is not pregnant      Passed - Last BP in normal range    BP Readings from Last 1 Encounters:  10/23/21 132/88         Passed - Valid encounter within last 6 months    Recent Outpatient Visits           2 months ago Encounter for general adult medical examination with abnormal findings   [place], Coralie Keens, NP   5 months ago Primary hypertension   [facility] [place], [place], [place]   9 months ago Closed nondisplaced fracture of distal phalanx of left index finger, initial encounter   [facility] [place], Coralie Keens, NP   11 months ago Acute bacterial sinusitis   [facility] [place], Coralie Keens, [place]

## 2022-01-05 ENCOUNTER — Other Ambulatory Visit: Payer: Self-pay | Admitting: Internal Medicine

## 2022-01-05 DIAGNOSIS — I10 Essential (primary) hypertension: Secondary | ICD-10-CM

## 2022-01-07 ENCOUNTER — Other Ambulatory Visit: Payer: Self-pay | Admitting: Internal Medicine

## 2022-01-07 DIAGNOSIS — I10 Essential (primary) hypertension: Secondary | ICD-10-CM

## 2022-01-07 MED ORDER — AMPHETAMINE-DEXTROAMPHETAMINE 10 MG PO TABS: 10.0000 mg | ORAL_TABLET | Freq: Every day | ORAL | 0 refills | Status: DC

## 2022-01-07 MED ORDER — AMPHETAMINE-DEXTROAMPHET ER 20 MG PO CP24: 20.0000 mg | ORAL_CAPSULE | Freq: Every day | ORAL | 0 refills | Status: DC

## 2022-01-18 ENCOUNTER — Other Ambulatory Visit: Payer: Self-pay | Admitting: Internal Medicine

## 2022-01-18 ENCOUNTER — Encounter: Payer: Self-pay | Admitting: Internal Medicine

## 2022-01-18 MED ORDER — AMPHETAMINE-DEXTROAMPHET ER 20 MG PO CP24: 20.0000 mg | ORAL_CAPSULE | Freq: Every day | ORAL | 0 refills | Status: DC

## 2022-02-07 ENCOUNTER — Other Ambulatory Visit: Payer: Self-pay | Admitting: Internal Medicine

## 2022-02-07 MED ORDER — AMPHETAMINE-DEXTROAMPHETAMINE 10 MG PO TABS: 10.0000 mg | ORAL_TABLET | Freq: Every day | ORAL | 0 refills | Status: DC

## 2022-02-17 ENCOUNTER — Other Ambulatory Visit: Payer: Self-pay | Admitting: Internal Medicine

## 2022-02-18 MED ORDER — AMPHETAMINE-DEXTROAMPHET ER 20 MG PO CP24: 20.0000 mg | ORAL_CAPSULE | Freq: Every day | ORAL | 0 refills | Status: DC

## 2022-03-08 ENCOUNTER — Other Ambulatory Visit: Payer: Self-pay | Admitting: Internal Medicine

## 2022-03-08 MED ORDER — AMPHETAMINE-DEXTROAMPHETAMINE 10 MG PO TABS: 10.0000 mg | ORAL_TABLET | Freq: Every day | ORAL | 0 refills | Status: DC

## 2022-03-16 ENCOUNTER — Other Ambulatory Visit: Payer: Self-pay | Admitting: Internal Medicine

## 2022-03-19 MED ORDER — AMPHETAMINE-DEXTROAMPHET ER 20 MG PO CP24: 20.0000 mg | ORAL_CAPSULE | Freq: Every day | ORAL | 0 refills | Status: DC

## 2022-04-01 ENCOUNTER — Other Ambulatory Visit: Payer: Self-pay | Admitting: Internal Medicine

## 2022-04-01 DIAGNOSIS — I10 Essential (primary) hypertension: Secondary | ICD-10-CM

## 2022-04-02 NOTE — Telephone Encounter (Signed)
Requested Prescriptions  Pending Prescriptions Disp Refills   lisinopril (ZESTRIL) 10 MG tablet [Pharmacy Med Name: Lisinopril 10 MG Oral Tablet] 90 tablet 0    Sig: Take 1 tablet by mouth once daily     Cardiovascular:  ACE Inhibitors Failed - 04/01/2022 10:09 AM      Failed - Cr in normal range and within 180 days    Creatinine, Ser  Date Value Ref Range Status  10/05/2019 0.90 0.40 - 1.50 mg/dL Final         Failed - K in normal range and within 180 days    Potassium  Date Value Ref Range Status  10/05/2019 4.4 3.5 - 5.1 mEq/L Final         Passed - Patient is not pregnant      Passed - Last BP in normal range    BP Readings from Last 1 Encounters:  10/23/21 132/88         Passed - Valid encounter within last 6 months    Recent Outpatient Visits           5 months ago Encounter for general adult medical examination with abnormal findings   [facility] [place], Coralie Keens, NP   8 months ago Primary hypertension   [facility] [place], [place], [place]   12 months ago Closed nondisplaced fracture of distal phalanx of left index finger, initial encounter   [facility] [place], Coralie Keens, NP   1 year ago Acute bacterial sinusitis   [facility] [place], Coralie Keens, [place]

## 2022-04-03 ENCOUNTER — Encounter: Payer: Self-pay | Admitting: Internal Medicine

## 2022-04-03 MED ORDER — CITALOPRAM HYDROBROMIDE 40 MG PO TABS: 40.0000 mg | ORAL_TABLET | Freq: Every day | ORAL | 0 refills | Status: DC

## 2022-04-08 ENCOUNTER — Other Ambulatory Visit: Payer: Self-pay | Admitting: Internal Medicine

## 2022-04-08 MED ORDER — AMPHETAMINE-DEXTROAMPHETAMINE 10 MG PO TABS: 10.0000 mg | ORAL_TABLET | Freq: Every day | ORAL | 0 refills | Status: DC

## 2022-04-08 MED ORDER — BUPROPION HCL ER (XL) 150 MG PO TB24: 150.0000 mg | ORAL_TABLET | Freq: Every day | ORAL | 0 refills | Status: DC

## 2022-04-08 NOTE — Addendum Note (Signed)
Addended by: Ashley Royalty E on: 04/08/2022 03:18 PM   Modules accepted: Orders

## 2022-04-15 ENCOUNTER — Other Ambulatory Visit: Payer: Self-pay | Admitting: Internal Medicine

## 2022-04-15 MED ORDER — AMPHETAMINE-DEXTROAMPHET ER 20 MG PO CP24: 20.0000 mg | ORAL_CAPSULE | Freq: Every day | ORAL | 0 refills | Status: DC

## 2022-05-04 ENCOUNTER — Other Ambulatory Visit: Payer: Self-pay | Admitting: Internal Medicine

## 2022-05-07 ENCOUNTER — Other Ambulatory Visit: Payer: Self-pay | Admitting: Internal Medicine

## 2022-05-07 MED ORDER — AMPHETAMINE-DEXTROAMPHETAMINE 10 MG PO TABS: 10.0000 mg | ORAL_TABLET | Freq: Every day | ORAL | 0 refills | Status: DC

## 2022-05-13 ENCOUNTER — Other Ambulatory Visit: Payer: Self-pay | Admitting: Internal Medicine

## 2022-05-14 ENCOUNTER — Other Ambulatory Visit: Payer: Self-pay | Admitting: Internal Medicine

## 2022-05-16 ENCOUNTER — Other Ambulatory Visit: Payer: Self-pay | Admitting: Internal Medicine

## 2022-05-16 MED ORDER — AMPHETAMINE-DEXTROAMPHET ER 20 MG PO CP24: 20.0000 mg | ORAL_CAPSULE | Freq: Every day | ORAL | 0 refills | Status: DC

## 2022-05-17 ENCOUNTER — Ambulatory Visit: Payer: Self-pay | Admitting: Internal Medicine

## 2022-05-17 ENCOUNTER — Ambulatory Visit (INDEPENDENT_AMBULATORY_CARE_PROVIDER_SITE_OTHER): Payer: Self-pay | Admitting: Internal Medicine

## 2022-05-17 DIAGNOSIS — Z91199 Patient's noncompliance with other medical treatment and regimen due to unspecified reason: Secondary | ICD-10-CM

## 2022-05-17 NOTE — Progress Notes (Deleted)
Subjective:    Patient ID: Peter Kramer, male    DOB: [birth year], 42 y.o.   MRN: [id number]  HPI  Patient presents to clinic today for follow-up of chronic conditions.  HTN: His BP today is.  He is taking Lisinopril as prescribed.  There is no ECG on file.  GERD: He is not sure what triggers this.  He takes Pepto-Bismol as needed with good relief of symptoms.  There is no upper GI on file.  General Herpes: He denies recent outbreak.  He takes Valacyclovir only as needed for outbreaks.  Anxiety and Depression: Chronic, managed on Citalopram and Bupropion.  He is not currently seeing a therapist.  He denies SI/HI.  ADHD: He reports inattention.  He is taking Adderall as prescribed.  He does not follow with psychiatry.  Chronic Right Shoulder Pain: Managed with Ibuprofen as needed with some relief of symptoms.  He does not follow with orthopedics.  HLD: His last LDL was 119, triglycerides 100, 2021.  He is not take any cholesterol-lowering medication at this time.  X-ray from 08/2019 reviewed.  He does not consume low-fat diet.  Neutrophilia:  His last WBC count was 14.7, 2021.  He no longer smokes.  Review of Systems     Past Medical History:  Diagnosis Date   ADHD (attention deficit hyperactivity disorder)    Chicken pox    Essential hypertension 08/08/2016   Generalized anxiety disorder 07/17/2015   Genital herpes    Smoker 11/18/2014    Current Outpatient Medications  Medication Sig Dispense Refill   amphetamine-dextroamphetamine (ADDERALL XR) 20 MG 24 hr capsule Take 1 capsule (20 mg total) by mouth daily. 30 capsule 0   amphetamine-dextroamphetamine (ADDERALL) 10 MG tablet Take 1 tablet (10 mg total) by mouth daily. 30 tablet 0   buPROPion (WELLBUTRIN XL) 150 MG 24 hr tablet Take 1 tablet (150 mg total) by mouth daily. 30 tablet 0   citalopram (CELEXA) 40 MG tablet Take 1 tablet (40 mg total) by mouth daily. Please schedule an office visit before anymore refills. 90  tablet 0   lisinopril (ZESTRIL) 10 MG tablet Take 1 tablet by mouth once daily 90 tablet 0   valACYclovir (VALTREX) 500 MG tablet Take 1 tablet (500 mg total) by mouth 2 (two) times daily as needed. 30 tablet 0   No current facility-administered medications for this visit.    Allergies  Allergen Reactions   Bee Venom Swelling    Family History  Problem Relation Age of Onset   ADD / ADHD Father    ADD / ADHD Sister    ADD / ADHD Sister    Heart disease Maternal Grandmother    Cancer Paternal Grandfather        brain   Diabetes Neg Hx    Stroke Neg Hx    Colon cancer Neg Hx    Prostate cancer Neg Hx     Social History   Socioeconomic History   Marital status: Married    Spouse name: emily   Number of children: 2   Years of education: Not on file   Highest education level: GED or equivalent  Occupational History   Occupation: [employer]    Comment: full time  Tobacco Use   Smoking status: Former    Packs/day: 1.00    Years: 14.00    Total pack years: 14.00    Types: Cigarettes   Smokeless tobacco: Never   Tobacco comments:    patient relapsed  Vaping Use   Vaping Use: Never used  Substance and Sexual Activity   Alcohol use: No    Alcohol/week: 0.0 standard drinks of alcohol    Comment: occasional   Drug use: No   Sexual activity: Yes    Birth control/protection: None  Other Topics Concern   Not on file  Social History Narrative   Not on file   Social Determinants of Health   Financial Resource Strain: High Risk (01/27/2017)   Overall Financial Resource Strain (CARDIA)    Difficulty of Paying Living Expenses: Very hard  Food Insecurity: Food Insecurity Present (01/27/2017)   Hunger Vital Sign    Worried About Running Out of Food in the Last Year: Sometimes true    Ran Out of Food in the Last Year: Sometimes true  Transportation Needs: No Transportation Needs (01/27/2017)   PRAPARE - Hydrologist (Medical): No    Lack of  Transportation (Non-Medical): No  Physical Activity: Sufficiently Active (01/27/2017)   Exercise Vital Sign    Days of Exercise per Week: 3 days    Minutes of Exercise per Session: 120 min  Stress: Stress Concern Present (01/27/2017)   [place]    Feeling of Stress : To some extent  Social Connections: Somewhat Isolated (01/27/2017)   Social Connection and Isolation Panel [NHANES]    Frequency of Communication with Friends and Family: More than three times a week    Frequency of Social Gatherings with Friends and Family: Once a week    Attends Religious Services: Never    Marine scientist or Organizations: No    Attends Archivist Meetings: Never    Marital Status: Married  Human resources officer Violence: Not At Risk (01/27/2017)   Humiliation, Afraid, Rape, and Kick questionnaire    Fear of Current or Ex-Partner: No    Emotionally Abused: No    Physically Abused: No    Sexually Abused: No     Constitutional: Denies fever, malaise, fatigue, headache or abrupt weight changes.  HEENT: Denies eye pain, eye redness, ear pain, ringing in the ears, wax buildup, runny nose, nasal congestion, bloody nose, or sore throat. Respiratory: Denies difficulty breathing, shortness of breath, cough or sputum production.   Cardiovascular: Denies chest pain, chest tightness, palpitations or swelling in the hands or feet.  Gastrointestinal: Denies abdominal pain, bloating, constipation, diarrhea or blood in the stool.  GU: Denies urgency, frequency, pain with urination, burning sensation, blood in urine, odor or discharge. Musculoskeletal: Patient reports chronic right shoulder pain.  Denies decrease in range of motion, difficulty with gait, muscle pain or joint swelling.  Skin: Denies redness, rashes, lesions or ulcercations.  Neurological: Patient reports inattention.  Denies dizziness, difficulty with memory, difficulty with  speech or problems with balance and coordination.  Psych: Patient has a history of anxiety and depression.  Denies SI/HI.  No other specific complaints in a complete review of systems (except as listed in HPI above).  Objective:   Physical Exam   There were no vitals taken for this visit. Wt Readings from Last 3 Encounters:  10/23/21 162 lb (73.5 kg)  07/13/21 179 lb (81.2 kg)  04/06/21 179 lb 6.4 oz (81.4 kg)    General: Appears their stated age, well developed, well nourished in NAD. Skin: Warm, dry and intact. No rashes, lesions or ulcerations noted. HEENT: Head: normal shape and size; Eyes: sclera white, no icterus, conjunctiva pink, PERRLA and  EOMs intact; Ears: Tm's gray and intact, normal light reflex; Nose: mucosa pink and moist, septum midline; Throat/Mouth: Teeth present, mucosa pink and moist, no exudate, lesions or ulcerations noted.  Neck:  Neck supple, trachea midline. No masses, lumps or thyromegaly present.  Cardiovascular: Normal rate and rhythm. S1,S2 noted.  No murmur, rubs or gallops noted. No JVD or BLE edema. No carotid bruits noted. Pulmonary/Chest: Normal effort and positive vesicular breath sounds. No respiratory distress. No wheezes, rales or ronchi noted.  Abdomen: Soft and nontender. Normal bowel sounds. No distention or masses noted. Liver, spleen and kidneys non palpable. Musculoskeletal: Normal range of motion. No signs of joint swelling. No difficulty with gait.  Neurological: Alert and oriented. Cranial nerves II-XII grossly intact. Coordination normal.  Psychiatric: Mood and affect normal. Behavior is normal. Judgment and thought content normal.    BMET    Component Value Date/Time   NA 136 10/05/2019 1334   K 4.4 10/05/2019 1334   CL 100 10/05/2019 1334   CO2 27 10/05/2019 1334   GLUCOSE 130 (H) 10/05/2019 1334   BUN 22 10/05/2019 1334   CREATININE 0.90 10/05/2019 1334   CALCIUM 9.6 10/05/2019 1334   GFRNONAA >60 01/08/2017 1239   GFRAA >60  01/08/2017 1239    Lipid Panel     Component Value Date/Time   CHOL 179 10/05/2019 1334   TRIG 100.0 10/05/2019 1334   HDL 40.10 10/05/2019 1334   CHOLHDL 4 10/05/2019 1334   VLDL 20.0 10/05/2019 1334   LDLCALC 119 (H) 10/05/2019 1334    CBC    Component Value Date/Time   WBC 14.7 (H) 10/05/2019 1334   RBC 4.79 10/05/2019 1334   HGB 14.3 10/05/2019 1334   HCT 42.5 10/05/2019 1334   PLT 337.0 10/05/2019 1334   MCV 88.8 10/05/2019 1334   MCH 29.2 01/08/2017 1239   MCHC 33.6 10/05/2019 1334   RDW 13.8 10/05/2019 1334    Hgb A1C No results found for: "HGBA1C"         Assessment & Plan:   RTC in 6 months for annual exam Webb Silversmith, NP

## 2022-05-17 NOTE — Progress Notes (Unsigned)
   Subjective:    Patient ID: Peter Kramer, male    DOB: [birth year], 42 y.o.   MRN: [id number]  Patient no showed for appointment

## 2022-06-05 ENCOUNTER — Other Ambulatory Visit: Payer: Self-pay | Admitting: Internal Medicine

## 2022-06-06 ENCOUNTER — Other Ambulatory Visit: Payer: Self-pay | Admitting: Internal Medicine

## 2022-06-06 ENCOUNTER — Encounter: Payer: Self-pay | Admitting: Internal Medicine

## 2022-06-10 ENCOUNTER — Ambulatory Visit (INDEPENDENT_AMBULATORY_CARE_PROVIDER_SITE_OTHER): Payer: Self-pay | Admitting: Internal Medicine

## 2022-06-10 ENCOUNTER — Encounter: Payer: Self-pay | Admitting: Internal Medicine

## 2022-06-10 VITALS — BP 128/82 | HR 72 | Temp 97.3°F | Wt 199.0 lb

## 2022-06-10 DIAGNOSIS — D729 Disorder of white blood cells, unspecified: Secondary | ICD-10-CM

## 2022-06-10 DIAGNOSIS — D72828 Other elevated white blood cell count: Secondary | ICD-10-CM

## 2022-06-10 DIAGNOSIS — R7309 Other abnormal glucose: Secondary | ICD-10-CM

## 2022-06-10 DIAGNOSIS — I10 Essential (primary) hypertension: Secondary | ICD-10-CM

## 2022-06-10 DIAGNOSIS — A6001 Herpesviral infection of penis: Secondary | ICD-10-CM

## 2022-06-10 DIAGNOSIS — E7800 Pure hypercholesterolemia, unspecified: Secondary | ICD-10-CM

## 2022-06-10 DIAGNOSIS — Z114 Encounter for screening for human immunodeficiency virus [HIV]: Secondary | ICD-10-CM

## 2022-06-10 DIAGNOSIS — R739 Hyperglycemia, unspecified: Secondary | ICD-10-CM

## 2022-06-10 DIAGNOSIS — Z136 Encounter for screening for cardiovascular disorders: Secondary | ICD-10-CM

## 2022-06-10 DIAGNOSIS — Z1159 Encounter for screening for other viral diseases: Secondary | ICD-10-CM

## 2022-06-10 DIAGNOSIS — F988 Other specified behavioral and emotional disorders with onset usually occurring in childhood and adolescence: Secondary | ICD-10-CM

## 2022-06-10 DIAGNOSIS — F4323 Adjustment disorder with mixed anxiety and depressed mood: Secondary | ICD-10-CM

## 2022-06-10 DIAGNOSIS — K219 Gastro-esophageal reflux disease without esophagitis: Secondary | ICD-10-CM

## 2022-06-10 MED ORDER — BUPROPION HCL ER (XL) 150 MG PO TB24: 150.0000 mg | ORAL_TABLET | Freq: Every day | ORAL | 0 refills | Status: DC

## 2022-06-10 MED ORDER — AMPHETAMINE-DEXTROAMPHET ER 20 MG PO CP24: 20.0000 mg | ORAL_CAPSULE | Freq: Every day | ORAL | 0 refills | Status: DC

## 2022-06-10 MED ORDER — CITALOPRAM HYDROBROMIDE 40 MG PO TABS: 40.0000 mg | ORAL_TABLET | Freq: Every day | ORAL | 0 refills | Status: DC

## 2022-06-10 MED ORDER — LISINOPRIL 10 MG PO TABS: 10.0000 mg | ORAL_TABLET | Freq: Every day | ORAL | 0 refills | Status: DC

## 2022-06-10 MED ORDER — AMPHETAMINE-DEXTROAMPHETAMINE 10 MG PO TABS: 10.0000 mg | ORAL_TABLET | Freq: Every day | ORAL | 0 refills | Status: DC

## 2022-06-10 NOTE — Assessment & Plan Note (Signed)
Try to avoid foods that trigger your reflux Encouraged weight loss as this can help reduce reflux symptoms Continue Pepto Bismol

## 2022-06-10 NOTE — Assessment & Plan Note (Signed)
Controlled on Lisinopril Reinforced DASH diet and exercise for weight loss 

## 2022-06-10 NOTE — Assessment & Plan Note (Signed)
Continue buproprion and citalopram Support offered

## 2022-06-10 NOTE — Assessment & Plan Note (Signed)
Continue Valcyclovir 

## 2022-06-10 NOTE — Patient Instructions (Signed)

## 2022-06-10 NOTE — Assessment & Plan Note (Signed)
Will check CBC at next visit 

## 2022-06-10 NOTE — Progress Notes (Signed)
Subjective:    Patient ID: Peter Kramer, male    DOB: 1980/12/01, 42 y.o.   MRN: [id number]  HPI  Patient presents to clinic today for follow-up of chronic conditions.  HTN: His BP today is 128/82.  He is taking Lisinopril as prescribed.  There is no ECG on file.  GERD: Triggered by random foods.  He takes Pepto-Bismol as needed with some relief of symptoms.  There is no upper GI on file.  Genital Herpes: He denies recent outbreak.  He takes Valacyclovir as needed for outbreaks.  Anxiety and Depression: Chronic, managed on Bupropion and Citalopram.  He is not currently seeing a therapist.  He denies SI/HI.  ADHD: He reports mainly inattention and difficulty with motivation.  He is taking Adderall as prescribed.  He does not follow with psychiatry.  HLD: His last LDL was 119, triglycerides 100, 09/2019.  He is not taking any cholesterol-lowering medication at this time.  He does not consume low-fat diet.  Leukocytosis: His last WBC count was 14.7, 09/2019.  He does smoke.  He does not follow with hematology.  Review of Systems     Past Medical History:  Diagnosis Date   ADHD (attention deficit hyperactivity disorder)    Chicken pox    Essential hypertension 08/08/2016   Generalized anxiety disorder 07/17/2015   Genital herpes    Smoker 11/18/2014    Current Outpatient Medications  Medication Sig Dispense Refill   amphetamine-dextroamphetamine (ADDERALL XR) 20 MG 24 hr capsule Take 1 capsule (20 mg total) by mouth daily. 30 capsule 0   amphetamine-dextroamphetamine (ADDERALL) 10 MG tablet Take 1 tablet (10 mg total) by mouth daily. 30 tablet 0   buPROPion (WELLBUTRIN XL) 150 MG 24 hr tablet Take 1 tablet (150 mg total) by mouth daily. 30 tablet 0   citalopram (CELEXA) 40 MG tablet Take 1 tablet (40 mg total) by mouth daily. Please schedule an office visit before anymore refills. 90 tablet 0   lisinopril (ZESTRIL) 10 MG tablet Take 1 tablet by mouth once daily 90 tablet 0    valACYclovir (VALTREX) 500 MG tablet Take 1 tablet (500 mg total) by mouth 2 (two) times daily as needed. 30 tablet 0   No current facility-administered medications for this visit.    Allergies  Allergen Reactions   Bee Venom Swelling    Family History  Problem Relation Age of Onset   ADD / ADHD Father    ADD / ADHD Sister    ADD / ADHD Sister    Heart disease Maternal Grandmother    Cancer Paternal Grandfather        brain   Diabetes Neg Hx    Stroke Neg Hx    Colon cancer Neg Hx    Prostate cancer Neg Hx     Social History   Socioeconomic History   Marital status: Married    Spouse name: Peter Kramer   Number of children: 2   Years of education: Not on file   Highest education level: GED or equivalent  Occupational History   Occupation: [employer]    Comment: full time  Tobacco Use   Smoking status: Former    Packs/day: 1.00    Years: 14.00    Additional pack years: 0.00    Total pack years: 14.00    Types: Cigarettes   Smokeless tobacco: Never   Tobacco comments:    patient relapsed  Vaping Use   Vaping Use: Never used  Substance and Sexual Activity  Alcohol use: No    Alcohol/week: 0.0 standard drinks of alcohol    Comment: occasional   Drug use: No   Sexual activity: Yes    Birth control/protection: None  Other Topics Concern   Not on file  Social History Narrative   Not on file   Social Determinants of Health   Financial Resource Strain: High Risk (01/27/2017)   Overall Financial Resource Strain (CARDIA)    Difficulty of Paying Living Expenses: Very hard  Food Insecurity: Food Insecurity Present (01/27/2017)   Hunger Vital Sign    Worried About Running Out of Food in the Last Year: Sometimes true    Ran Out of Food in the Last Year: Sometimes true  Transportation Needs: No Transportation Needs (01/27/2017)   PRAPARE - Hydrologist (Medical): No    Lack of Transportation (Non-Medical): No  Physical Activity: Sufficiently  Active (01/27/2017)   Exercise Vital Sign    Days of Exercise per Week: 3 days    Minutes of Exercise per Session: 120 min  Stress: Stress Concern Present (01/27/2017)   Electra    Feeling of Stress : To some extent  Social Connections: Somewhat Isolated (01/27/2017)   Social Connection and Isolation Panel [NHANES]    Frequency of Communication with Friends and Family: More than three times a week    Frequency of Social Gatherings with Friends and Family: Once a week    Attends Religious Services: Never    Marine scientist or Organizations: No    Attends Archivist Meetings: Never    Marital Status: Married  Human resources officer Violence: Not At Risk (01/27/2017)   Humiliation, Afraid, Rape, and Kick questionnaire    Fear of Current or Ex-Partner: No    Emotionally Abused: No    Physically Abused: No    Sexually Abused: No     Constitutional: Denies fever, malaise, fatigue, headache or abrupt weight changes.  HEENT: Denies eye pain, eye redness, ear pain, ringing in the ears, wax buildup, runny nose, nasal congestion, bloody nose, or sore throat. Respiratory: Denies difficulty breathing, shortness of breath, cough or sputum production.   Cardiovascular: Denies chest pain, chest tightness, palpitations or swelling in the hands or feet.  Gastrointestinal: Denies abdominal pain, bloating, constipation, diarrhea or blood in the stool.  GU: Denies urgency, frequency, pain with urination, burning sensation, blood in urine, odor or discharge. Musculoskeletal:  Denies decrease in range of motion, difficulty with gait, muscle pain or joint pain and swelling.  Skin: Denies redness, rashes, lesions or ulcercations.  Neurological: Patient reports inattention.  Denies dizziness, difficulty with memory, difficulty with speech or problems with balance and coordination.  Psych: Patient has a history of anxiety and  depression.  Denies SI/HI.  No other specific complaints in a complete review of systems (except as listed in HPI above).  Objective:   Physical Exam  BP 128/82 (BP Location: Right Arm, Patient Position: Sitting, Cuff Size: Normal)   Pulse 72   Temp (!) 97.3 F (36.3 C) (Temporal)   Wt 199 lb (90.3 kg)   SpO2 99%   BMI 28.55 kg/m   Wt Readings from Last 3 Encounters:  10/23/21 162 lb (73.5 kg)  07/13/21 179 lb (81.2 kg)  04/06/21 179 lb 6.4 oz (81.4 kg)    General: Appears his stated age, overweight, in NAD. Skin: Warm, dry and intact. No rashes, lesions or ulcerations noted. Cardiovascular: Normal rate  and rhythm.  Pulmonary/Chest: Normal effort and positive vesicular breath sounds. No respiratory distress. No wheezes, rales or ronchi noted.  Abdomen:  Normal bowel sounds.  Musculoskeletal: No difficulty with gait.  Neurological: Alert and oriented. Coordination normal.  Psychiatric: Mood and affect normal. Behavior is normal. Judgment and thought content normal.    BMET    Component Value Date/Time   NA 136 10/05/2019 1334   K 4.4 10/05/2019 1334   CL 100 10/05/2019 1334   CO2 27 10/05/2019 1334   GLUCOSE 130 (H) 10/05/2019 1334   BUN 22 10/05/2019 1334   CREATININE 0.90 10/05/2019 1334   CALCIUM 9.6 10/05/2019 1334   GFRNONAA >60 01/08/2017 1239   GFRAA >60 01/08/2017 1239    Lipid Panel     Component Value Date/Time   CHOL 179 10/05/2019 1334   TRIG 100.0 10/05/2019 1334   HDL 40.10 10/05/2019 1334   CHOLHDL 4 10/05/2019 1334   VLDL 20.0 10/05/2019 1334   LDLCALC 119 (H) 10/05/2019 1334    CBC    Component Value Date/Time   WBC 14.7 (H) 10/05/2019 1334   RBC 4.79 10/05/2019 1334   HGB 14.3 10/05/2019 1334   HCT 42.5 10/05/2019 1334   PLT 337.0 10/05/2019 1334   MCV 88.8 10/05/2019 1334   MCH 29.2 01/08/2017 1239   MCHC 33.6 10/05/2019 1334   RDW 13.8 10/05/2019 1334    Hgb A1C No results found for: "HGBA1C"        Assessment & Plan:      RTC in 6 months for your annual exam Webb Silversmith, NP

## 2022-06-10 NOTE — Assessment & Plan Note (Signed)
Continue Adderall 

## 2022-06-10 NOTE — Assessment & Plan Note (Signed)
Will check CMET and lipid profile at next visit Encouraged low fat diet

## 2022-06-21 ENCOUNTER — Other Ambulatory Visit: Payer: Self-pay

## 2022-06-21 DIAGNOSIS — R7309 Other abnormal glucose: Secondary | ICD-10-CM

## 2022-06-21 DIAGNOSIS — Z114 Encounter for screening for human immunodeficiency virus [HIV]: Secondary | ICD-10-CM

## 2022-06-21 DIAGNOSIS — Z136 Encounter for screening for cardiovascular disorders: Secondary | ICD-10-CM

## 2022-06-21 DIAGNOSIS — Z1159 Encounter for screening for other viral diseases: Secondary | ICD-10-CM

## 2022-06-21 DIAGNOSIS — I10 Essential (primary) hypertension: Secondary | ICD-10-CM

## 2022-06-24 ENCOUNTER — Other Ambulatory Visit: Payer: Self-pay

## 2022-07-08 ENCOUNTER — Other Ambulatory Visit: Payer: Self-pay | Admitting: Internal Medicine

## 2022-07-08 MED ORDER — AMPHETAMINE-DEXTROAMPHET ER 20 MG PO CP24: 20.0000 mg | ORAL_CAPSULE | Freq: Every day | ORAL | 0 refills | Status: DC

## 2022-07-08 MED ORDER — AMPHETAMINE-DEXTROAMPHETAMINE 10 MG PO TABS: 10.0000 mg | ORAL_TABLET | Freq: Every day | ORAL | 0 refills | Status: DC

## 2022-07-08 MED ORDER — BUPROPION HCL ER (XL) 150 MG PO TB24: 150.0000 mg | ORAL_TABLET | Freq: Every day | ORAL | 1 refills | Status: DC

## 2022-08-07 ENCOUNTER — Other Ambulatory Visit: Payer: Self-pay | Admitting: Internal Medicine

## 2022-08-07 DIAGNOSIS — I10 Essential (primary) hypertension: Secondary | ICD-10-CM

## 2022-08-07 MED ORDER — LISINOPRIL 10 MG PO TABS
10.0000 mg | ORAL_TABLET | Freq: Every day | ORAL | 0 refills | Status: DC
Start: 2022-08-07 — End: 2022-12-09

## 2022-08-07 MED ORDER — AMPHETAMINE-DEXTROAMPHET ER 20 MG PO CP24: 20.0000 mg | ORAL_CAPSULE | Freq: Every day | ORAL | 0 refills | Status: DC

## 2022-08-07 MED ORDER — AMPHETAMINE-DEXTROAMPHETAMINE 10 MG PO TABS: 10.0000 mg | ORAL_TABLET | Freq: Every day | ORAL | 0 refills | Status: DC

## 2022-09-03 ENCOUNTER — Other Ambulatory Visit: Payer: Self-pay | Admitting: Internal Medicine

## 2022-09-03 DIAGNOSIS — I10 Essential (primary) hypertension: Secondary | ICD-10-CM

## 2022-09-04 MED ORDER — AMPHETAMINE-DEXTROAMPHET ER 20 MG PO CP24: 20.0000 mg | ORAL_CAPSULE | Freq: Every day | ORAL | 0 refills | Status: DC

## 2022-09-04 MED ORDER — AMPHETAMINE-DEXTROAMPHETAMINE 10 MG PO TABS: 10.0000 mg | ORAL_TABLET | Freq: Every day | ORAL | 0 refills | Status: DC

## 2022-09-06 ENCOUNTER — Other Ambulatory Visit: Payer: Self-pay | Admitting: Internal Medicine

## 2022-09-09 ENCOUNTER — Encounter: Payer: Self-pay | Admitting: Internal Medicine

## 2022-09-09 MED ORDER — AMPHETAMINE-DEXTROAMPHET ER 20 MG PO CP24: 20.0000 mg | ORAL_CAPSULE | Freq: Every day | ORAL | 0 refills | Status: DC

## 2022-10-07 ENCOUNTER — Other Ambulatory Visit: Payer: Self-pay | Admitting: Internal Medicine

## 2022-10-07 MED ORDER — AMPHETAMINE-DEXTROAMPHET ER 20 MG PO CP24: 20.0000 mg | ORAL_CAPSULE | Freq: Every day | ORAL | 0 refills | Status: DC

## 2022-10-07 MED ORDER — CITALOPRAM HYDROBROMIDE 40 MG PO TABS: 40.0000 mg | ORAL_TABLET | Freq: Every day | ORAL | 0 refills | Status: DC

## 2022-10-07 MED ORDER — AMPHETAMINE-DEXTROAMPHETAMINE 10 MG PO TABS: 10.0000 mg | ORAL_TABLET | Freq: Every day | ORAL | 0 refills | Status: DC

## 2022-11-05 ENCOUNTER — Other Ambulatory Visit: Payer: Self-pay | Admitting: Internal Medicine

## 2022-11-05 MED ORDER — AMPHETAMINE-DEXTROAMPHET ER 20 MG PO CP24: 20.0000 mg | ORAL_CAPSULE | Freq: Every day | ORAL | 0 refills | Status: DC

## 2022-11-05 MED ORDER — AMPHETAMINE-DEXTROAMPHETAMINE 10 MG PO TABS: 10.0000 mg | ORAL_TABLET | Freq: Every day | ORAL | 0 refills | Status: DC

## 2022-11-10 ENCOUNTER — Other Ambulatory Visit: Payer: Self-pay | Admitting: Internal Medicine

## 2022-11-11 MED ORDER — AMPHETAMINE-DEXTROAMPHET ER 20 MG PO CP24: 20.0000 mg | ORAL_CAPSULE | Freq: Every day | ORAL | 0 refills | Status: DC

## 2022-12-06 ENCOUNTER — Other Ambulatory Visit: Payer: Self-pay | Admitting: Internal Medicine

## 2022-12-06 DIAGNOSIS — I10 Essential (primary) hypertension: Secondary | ICD-10-CM

## 2022-12-09 ENCOUNTER — Other Ambulatory Visit: Payer: Self-pay | Admitting: Internal Medicine

## 2022-12-09 ENCOUNTER — Encounter: Payer: Self-pay | Admitting: Internal Medicine

## 2022-12-09 ENCOUNTER — Telehealth: Payer: Self-pay | Admitting: Internal Medicine

## 2022-12-09 ENCOUNTER — Ambulatory Visit (INDEPENDENT_AMBULATORY_CARE_PROVIDER_SITE_OTHER): Payer: Self-pay | Admitting: Internal Medicine

## 2022-12-09 VITALS — BP 156/98 | HR 77 | Temp 95.7°F | Ht 70.0 in | Wt 196.0 lb

## 2022-12-09 DIAGNOSIS — Z6828 Body mass index (BMI) 28.0-28.9, adult: Secondary | ICD-10-CM

## 2022-12-09 DIAGNOSIS — E663 Overweight: Secondary | ICD-10-CM

## 2022-12-09 DIAGNOSIS — I10 Essential (primary) hypertension: Secondary | ICD-10-CM

## 2022-12-09 DIAGNOSIS — Z0001 Encounter for general adult medical examination with abnormal findings: Secondary | ICD-10-CM

## 2022-12-09 MED ORDER — LISINOPRIL 10 MG PO TABS: 10.0000 mg | ORAL_TABLET | Freq: Every day | ORAL | 1 refills | Status: DC

## 2022-12-09 MED ORDER — AMPHETAMINE-DEXTROAMPHETAMINE 10 MG PO TABS: 10.0000 mg | ORAL_TABLET | Freq: Every day | ORAL | 0 refills | Status: DC

## 2022-12-09 MED ORDER — BUPROPION HCL ER (XL) 150 MG PO TB24: 150.0000 mg | ORAL_TABLET | Freq: Every day | ORAL | 1 refills | Status: DC

## 2022-12-09 MED ORDER — AMPHETAMINE-DEXTROAMPHET ER 20 MG PO CP24: 20.0000 mg | ORAL_CAPSULE | Freq: Every day | ORAL | 0 refills | Status: DC

## 2022-12-09 NOTE — Assessment & Plan Note (Signed)
Encouraged diet and exercise for weight loss ?

## 2022-12-09 NOTE — Patient Instructions (Signed)
Health Maintenance, Male Adopting a healthy lifestyle and getting preventive care are important in promoting health and wellness. Ask your health care provider about: The right schedule for you to have regular tests and exams. Things you can do on your own to prevent diseases and keep yourself healthy. What should I know about diet, weight, and exercise? Eat a healthy diet  Eat a diet that includes plenty of vegetables, fruits, low-fat dairy products, and lean protein. Do not eat a lot of foods that are high in solid fats, added sugars, or sodium. Maintain a healthy weight Body mass index (BMI) is a measurement that can be used to identify possible weight problems. It estimates body fat based on height and weight. Your health care provider can help determine your BMI and help you achieve or maintain a healthy weight. Get regular exercise Get regular exercise. This is one of the most important things you can do for your health. Most adults should: Exercise for at least 40 minutes each week. The exercise should increase your heart rate and make you sweat (moderate-intensity exercise). Do strengthening exercises at least twice a week. This is in addition to the moderate-intensity exercise. Spend less time sitting. Even light physical activity can be beneficial. Watch cholesterol and blood lipids Have your blood tested for lipids and cholesterol at 40 years of age, then have this test every 5 years. You may need to have your cholesterol levels checked more often if: Your lipid or cholesterol levels are high. You are older than 40 years of age. You are at high risk for heart disease. What should I know about cancer screening? Many types of cancers can be detected early and may often be prevented. Depending on your health history and family history, you may need to have cancer screening at various ages. This may include screening for: Colorectal cancer. Prostate cancer. Skin cancer. Lung  cancer. What should I know about heart disease, diabetes, and high blood pressure? Blood pressure and heart disease High blood pressure causes heart disease and increases the risk of stroke. This is more likely to develop in people who have high blood pressure readings or are overweight. Talk with your health care provider about your target blood pressure readings. Have your blood pressure checked: Every 3-5 years if you are 40-57 years of age. Every year if you are 40 years old or older. If you are between the ages of 30 and 21 and are a current or former smoker, ask your health care provider if you should have a one-time screening for abdominal aortic aneurysm (AAA). Diabetes Have regular diabetes screenings. This checks your fasting blood sugar level. Have the screening done: Once every three years after age 80 if you are at a normal weight and have a low risk for diabetes. More often and at a younger age if you are overweight or have a high risk for diabetes. What should I know about preventing infection? Hepatitis B If you have a higher risk for hepatitis B, you should be screened for this virus. Talk with your health care provider to find out if you are at risk for hepatitis B infection. Hepatitis C Blood testing is recommended for: Everyone born from [birth year] through 1965. Anyone with known risk factors for hepatitis C. Sexually transmitted infections (STIs) You should be screened each year for STIs, including gonorrhea and chlamydia, if: You are sexually active and are younger than 40 years of age. You are older than 40 years of age and your  health care provider tells you that you are at risk for this type of infection. Your sexual activity has changed since you were last screened, and you are at increased risk for chlamydia or gonorrhea. Ask your health care provider if you are at risk. Ask your health care provider about whether you are at high risk for HIV. Your health care provider  may recommend a prescription medicine to help prevent HIV infection. If you choose to take medicine to prevent HIV, you should first get tested for HIV. You should then be tested every 3 months for as long as you are taking the medicine. Follow these instructions at home: Alcohol use Do not drink alcohol if your health care provider tells you not to drink. If you drink alcohol: Limit how much you have to 0-2 drinks a day. Know how much alcohol is in your drink. In the [place] one drink equals one 12 oz bottle of beer (355 mL), one 5 oz glass of wine (148 mL), or one 1 oz glass of hard liquor (44 mL). Lifestyle Do not use any products that contain nicotine or tobacco. These products include cigarettes, chewing tobacco, and vaping devices, such as e-cigarettes. If you need help quitting, ask your health care provider. Do not use street drugs. Do not share needles. Ask your health care provider for help if you need support or information about quitting drugs. General instructions Schedule regular health, dental, and eye exams. Stay current with your vaccines. Tell your health care provider if: You often feel depressed. You have ever been abused or do not feel safe at home. Summary Adopting a healthy lifestyle and getting preventive care are important in promoting health and wellness. Follow your health care provider's instructions about healthy diet, exercising, and getting tested or screened for diseases. Follow your health care provider's instructions on monitoring your cholesterol and blood pressure. This information is not intended to replace advice given to you by your health care provider. Make sure you discuss any questions you have with your health care provider. Document Revised: 07/31/2020 Document Reviewed: 07/31/2020 Elsevier Patient Education  2024 [employer].

## 2022-12-09 NOTE — Telephone Encounter (Signed)
This was sent earlier today and the pharmacy confirmed receipt

## 2022-12-09 NOTE — Progress Notes (Signed)
Subjective:    Patient ID: Peter Kramer, male    DOB: 1980/10/22, 42 y.o.   MRN: [id number]  HPI  Patient presents to clinic today for his annual exam. Of note, his BP today is 160/106. He ran out of his lisinopril about 1 month ago.  Flu: Never Tetanus: 05/2018 COVID: Never Vision screening: as needed Dentist: as needed  Diet: He does eat meat. He consumes fruits and veggies. He tries to avoid fried foods. He drinks mostly water. Exercise: swim, weight lifting  Review of Systems  Past Medical History:  Diagnosis Date   ADHD (attention deficit hyperactivity disorder)    Chicken pox    Essential hypertension 08/08/2016   Generalized anxiety disorder 07/17/2015   Genital herpes    Smoker 11/18/2014    Current Outpatient Medications  Medication Sig Dispense Refill   amphetamine-dextroamphetamine (ADDERALL XR) 20 MG 24 hr capsule Take 1 capsule (20 mg total) by mouth daily. 30 capsule 0   amphetamine-dextroamphetamine (ADDERALL) 10 MG tablet Take 1 tablet (10 mg total) by mouth daily. 30 tablet 0   buPROPion (WELLBUTRIN XL) 150 MG 24 hr tablet Take 1 tablet (150 mg total) by mouth daily. 90 tablet 1   citalopram (CELEXA) 40 MG tablet Take 1 tablet (40 mg total) by mouth daily. Please schedule an office visit before anymore refills. 90 tablet 0   lisinopril (ZESTRIL) 10 MG tablet Take 1 tablet (10 mg total) by mouth daily. 90 tablet 0   valACYclovir (VALTREX) 500 MG tablet Take 1 tablet (500 mg total) by mouth 2 (two) times daily as needed. 30 tablet 0   No current facility-administered medications for this visit.    Allergies  Allergen Reactions   Bee Venom Swelling    Family History  Problem Relation Age of Onset   ADD / ADHD Father    ADD / ADHD Sister    ADD / ADHD Sister    Heart disease Maternal Grandmother    Cancer Paternal Grandfather        brain   Diabetes Neg Hx    Stroke Neg Hx    Colon cancer Neg Hx    Prostate cancer Neg Hx     Social History    Socioeconomic History   Marital status: Married    Spouse name: emily   Number of children: 2   Years of education: Not on file   Highest education level: GED or equivalent  Occupational History   Occupation: [employer]    Comment: full time  Tobacco Use   Smoking status: Former    Current packs/day: 1.00    Average packs/day: 1 pack/day for 14.0 years (14.0 ttl pk-yrs)    Types: Cigarettes   Smokeless tobacco: Never   Tobacco comments:    patient relapsed  Vaping Use   Vaping status: Never Used  Substance and Sexual Activity   Alcohol use: No    Alcohol/week: 0.0 standard drinks of alcohol    Comment: occasional   Drug use: No   Sexual activity: Yes    Birth control/protection: None  Other Topics Concern   Not on file  Social History Narrative   Not on file   Social Determinants of Health   Financial Resource Strain: High Risk (01/27/2017)   Overall Financial Resource Strain (CARDIA)    Difficulty of Paying Living Expenses: Very hard  Food Insecurity: Food Insecurity Present (01/27/2017)   Hunger Vital Sign    Worried About Programme researcher, broadcasting/film/video in  the Last Year: Sometimes true    Ran Out of Food in the Last Year: Sometimes true  Transportation Needs: No Transportation Needs (01/27/2017)   PRAPARE - Administrator, Civil Service (Medical): No    Lack of Transportation (Non-Medical): No  Physical Activity: Sufficiently Active (01/27/2017)   Exercise Vital Sign    Days of Exercise per Week: 3 days    Minutes of Exercise per Session: 120 min  Stress: Stress Concern Present (01/27/2017)   [employer] of Occupational Health - Occupational Stress Questionnaire    Feeling of Stress : To some extent  Social Connections: Somewhat Isolated (01/27/2017)   Social Connection and Isolation Panel [NHANES]    Frequency of Communication with Friends and Family: More than three times a week    Frequency of Social Gatherings with Friends and Family: Once a week     Attends Religious Services: Never    Database administrator or Organizations: No    Attends Banker Meetings: Never    Marital Status: Married  Catering manager Violence: Not At Risk (01/27/2017)   Humiliation, Afraid, Rape, and Kick questionnaire    Fear of Current or Ex-Partner: No    Emotionally Abused: No    Physically Abused: No    Sexually Abused: No     Constitutional: Denies fever, malaise, fatigue, headache or abrupt weight changes.  HEENT: Denies eye pain, eye redness, ear pain, ringing in the ears, wax buildup, runny nose, nasal congestion, bloody nose, or sore throat. Respiratory: Denies difficulty breathing, shortness of breath, cough or sputum production.   Cardiovascular: Denies chest pain, chest tightness, palpitations or swelling in the hands or feet.  Gastrointestinal: Denies abdominal pain, bloating, constipation, diarrhea or blood in the stool.  GU: Denies urgency, frequency, pain with urination, burning sensation, blood in urine, odor or discharge. Musculoskeletal: Denies decrease in range of motion, difficulty with gait, muscle pain or joint pain and swelling.  Skin: Denies redness, rashes, lesions or ulcercations.  Neurological: Patient reports inattention.  Denies dizziness, difficulty with memory, difficulty with speech or problems with balance and coordination.  Psych: Patient has a history of anxiety and depression.  Denies SI/HI.  No other specific complaints in a complete review of systems (except as listed in HPI above).     Objective:   Physical Exam   BP (!) 156/98 (BP Location: Left Arm, Patient Position: Sitting, Cuff Size: Normal)   Pulse 77   Temp (!) 95.7 F (35.4 C) (Temporal)   Ht 5\' 10"  (1.778 m)   Wt 196 lb (88.9 kg)   SpO2 99%   BMI 28.12 kg/m   Wt Readings from Last 3 Encounters:  06/10/22 199 lb (90.3 kg)  10/23/21 162 lb (73.5 kg)  07/13/21 179 lb (81.2 kg)    General: Appears his stated age, overweight, in  NAD. Skin: Warm, dry and intact.  Sebaceous cyst noted to left side of neck. HEENT: Head: normal shape and size; Eyes: sclera white, no icterus, conjunctiva pink, PERRLA and EOMs intact;  Neck:  Neck supple, trachea midline. No masses, lumps or thyromegaly present.  Cardiovascular: Normal rate and rhythm. S1,S2 noted.  No murmur, rubs or gallops noted. No JVD or BLE edema.  Pulmonary/Chest: Normal effort and positive vesicular breath sounds. No respiratory distress. No wheezes, rales or ronchi noted.  Abdomen: Soft and nontender. Normal bowel sounds.  Musculoskeletal: Strength 5/5 BUE/BLE.  No difficulty with gait.  Neurological: Alert and oriented. Cranial nerves II-XII grossly  intact. Coordination normal.  Psychiatric: Mood and affect normal. Behavior is normal. Judgment and thought content normal.    BMET    Component Value Date/Time   NA 136 10/05/2019 1334   K 4.4 10/05/2019 1334   CL 100 10/05/2019 1334   CO2 27 10/05/2019 1334   GLUCOSE 130 (H) 10/05/2019 1334   BUN 22 10/05/2019 1334   CREATININE 0.90 10/05/2019 1334   CALCIUM 9.6 10/05/2019 1334   GFRNONAA >60 01/08/2017 1239   GFRAA >60 01/08/2017 1239    Lipid Panel     Component Value Date/Time   CHOL 179 10/05/2019 1334   TRIG 100.0 10/05/2019 1334   HDL 40.10 10/05/2019 1334   CHOLHDL 4 10/05/2019 1334   VLDL 20.0 10/05/2019 1334   LDLCALC 119 (H) 10/05/2019 1334    CBC    Component Value Date/Time   WBC 14.7 (H) 10/05/2019 1334   RBC 4.79 10/05/2019 1334   HGB 14.3 10/05/2019 1334   HCT 42.5 10/05/2019 1334   PLT 337.0 10/05/2019 1334   MCV 88.8 10/05/2019 1334   MCH 29.2 01/08/2017 1239   MCHC 33.6 10/05/2019 1334   RDW 13.8 10/05/2019 1334    Hgb A1C No results found for: "HGBA1C"          Assessment & Plan:   Preventative health maintenance:  He declines flu shot Tetanus UTD Encouraged him to get his COVID vaccine Encouraged him to consume a balanced diet and exercise  regimen Advised him to see an eye doctor and dentist annually We will check c-Met, lipid profile   RTC in 6 months, follow-up chronic conditions Nicki Reaper, NP

## 2022-12-09 NOTE — Telephone Encounter (Signed)
Pt is requesting medication transfer amphetamine-dextroamphetamine (ADDERALL XR) 20 MG 24 hr capsule, amphetamine-dextroamphetamine (ADDERALL) 10 MG tablet to pharmacy below.  [facility] [address]),  - [address]  [address]) [place] [address]  Phone: [phone] Fax: [phone]  Hours: Not open 24 hours   Please advise.

## 2023-01-09 ENCOUNTER — Other Ambulatory Visit: Payer: Self-pay | Admitting: Internal Medicine

## 2023-01-09 MED ORDER — AMPHETAMINE-DEXTROAMPHET ER 20 MG PO CP24: 20.0000 mg | ORAL_CAPSULE | Freq: Every day | ORAL | 0 refills | Status: DC

## 2023-01-09 MED ORDER — AMPHETAMINE-DEXTROAMPHETAMINE 10 MG PO TABS: 10.0000 mg | ORAL_TABLET | Freq: Every day | ORAL | 0 refills | Status: DC

## 2023-02-05 ENCOUNTER — Other Ambulatory Visit: Payer: Self-pay | Admitting: Internal Medicine

## 2023-02-06 ENCOUNTER — Other Ambulatory Visit: Payer: Self-pay | Admitting: Internal Medicine

## 2023-02-06 NOTE — Telephone Encounter (Signed)
Requested Prescriptions  Pending Prescriptions Disp Refills   citalopram (CELEXA) 40 MG tablet [Pharmacy Med Name: Citalopram Hydrobromide 40 MG Oral Tablet] 90 tablet 1    Sig: TAKE 1 TABLET BY MOUTH ONCE DAILY (SCHEDULE  AN  OFFICE  VISIT  BEFORE  ANYMORE  REFILLS)     Psychiatry:  Antidepressants - SSRI Passed - 02/05/2023 10:23 PM      Passed - Valid encounter within last 6 months    Recent Outpatient Visits           1 month ago Encounter for general adult medical examination with abnormal findings   [place] [facility] [place], Salvadore Oxford, NP   8 months ago Primary hypertension   [place] [facility] [place], Salvadore Oxford, NP   8 months ago No-show for appointment   [place] [facility] [place], Salvadore Oxford, NP   1 year ago Encounter for general adult medical examination with abnormal findings   [place] [facility] [place], Salvadore Oxford, NP   1 year ago Primary hypertension   [facility] [place], Salvadore Oxford, NP       Future Appointments             In 4 months [place], Salvadore Oxford, NP [place] [facility], [facility]

## 2023-02-07 MED ORDER — AMPHETAMINE-DEXTROAMPHETAMINE 10 MG PO TABS: 10.0000 mg | ORAL_TABLET | Freq: Every day | ORAL | 0 refills | Status: DC

## 2023-02-15 ENCOUNTER — Other Ambulatory Visit: Payer: Self-pay | Admitting: Internal Medicine

## 2023-02-17 ENCOUNTER — Other Ambulatory Visit: Payer: Self-pay | Admitting: Internal Medicine

## 2023-02-17 MED ORDER — AMPHETAMINE-DEXTROAMPHET ER 20 MG PO CP24: 20.0000 mg | ORAL_CAPSULE | Freq: Every day | ORAL | 0 refills | Status: DC

## 2023-03-04 ENCOUNTER — Encounter: Payer: Self-pay | Admitting: Internal Medicine

## 2023-03-04 ENCOUNTER — Telehealth: Payer: Medicaid Other | Admitting: Internal Medicine

## 2023-03-04 DIAGNOSIS — F900 Attention-deficit hyperactivity disorder, predominantly inattentive type: Secondary | ICD-10-CM

## 2023-03-04 DIAGNOSIS — J019 Acute sinusitis, unspecified: Secondary | ICD-10-CM | POA: Diagnosis not present

## 2023-03-04 DIAGNOSIS — B9689 Other specified bacterial agents as the cause of diseases classified elsewhere: Secondary | ICD-10-CM | POA: Diagnosis not present

## 2023-03-04 DIAGNOSIS — L989 Disorder of the skin and subcutaneous tissue, unspecified: Secondary | ICD-10-CM

## 2023-03-04 DIAGNOSIS — D229 Melanocytic nevi, unspecified: Secondary | ICD-10-CM

## 2023-03-04 DIAGNOSIS — F909 Attention-deficit hyperactivity disorder, unspecified type: Secondary | ICD-10-CM | POA: Diagnosis not present

## 2023-03-04 MED ORDER — AMPHETAMINE-DEXTROAMPHETAMINE 10 MG PO TABS: ORAL_TABLET | ORAL | 0 refills | Status: DC

## 2023-03-04 MED ORDER — AMOXICILLIN-POT CLAVULANATE 875-125 MG PO TABS: 1.0000 | ORAL_TABLET | Freq: Two times a day (BID) | ORAL | 0 refills | Status: DC

## 2023-03-04 NOTE — Patient Instructions (Signed)

## 2023-03-04 NOTE — Progress Notes (Signed)
Virtual Visit via Video Note  I connected with Peter Kramer on 03/04/23 at 11:20 AM EST by a video enabled telemedicine application and verified that I am speaking with the correct person using two identifiers.  Location: Patient: In his car Provider: Office  Persons participating in this video call: Nicki Reaper, NP and Ellan Lambert   I discussed the limitations of evaluation and management by telemedicine and the availability of in person appointments. The patient expressed understanding and agreed to proceed.  History of Present Illness:   Discussed the use of AI scribe software for clinical note transcription with the patient, who gave verbal consent to proceed.  The patient sought a referral to a dermatologist for the removal of a skin lesion on his neck and evaluation of three moles.   He also reported a week and a half to two weeks of sinus infection symptoms, including headache, sinus pressure, runny nose, nasal congestion, and ear pain. He denied having a cough but reported having a fever at the onset of the symptoms. He had been self-treating with Mucinex Sinusmax and had tested negative for COVID-19 twice.  In addition to these concerns, the patient reported developing a tolerance to his current dose of Adderall. He described feeling fatigued by 4 PM despite taking a 10 mg dose at lunchtime. He expressed a desire to adjust the dosing schedule to better manage his symptoms in the afternoon.       Past Medical History:  Diagnosis Date   ADHD (attention deficit hyperactivity disorder)    Chicken pox    Essential hypertension 08/08/2016   Generalized anxiety disorder 07/17/2015   Genital herpes    Smoker 11/18/2014    Current Outpatient Medications  Medication Sig Dispense Refill   amphetamine-dextroamphetamine (ADDERALL XR) 20 MG 24 hr capsule Take 1 capsule (20 mg total) by mouth daily. 30 capsule 0   amphetamine-dextroamphetamine (ADDERALL) 10 MG tablet Take 1 tablet  (10 mg total) by mouth daily. 30 tablet 0   buPROPion (WELLBUTRIN XL) 150 MG 24 hr tablet Take 1 tablet (150 mg total) by mouth daily. 90 tablet 1   citalopram (CELEXA) 40 MG tablet TAKE 1 TABLET BY MOUTH ONCE DAILY (SCHEDULE  AN  OFFICE  VISIT  BEFORE  ANYMORE  REFILLS) 90 tablet 1   lisinopril (ZESTRIL) 10 MG tablet Take 1 tablet (10 mg total) by mouth daily. 90 tablet 1   valACYclovir (VALTREX) 500 MG tablet Take 1 tablet (500 mg total) by mouth 2 (two) times daily as needed. 30 tablet 0   No current facility-administered medications for this visit.    Allergies  Allergen Reactions   Bee Venom Swelling    Family History  Problem Relation Age of Onset   ADD / ADHD Father    ADD / ADHD Sister    ADD / ADHD Sister    Heart disease Maternal Grandmother    Cancer Paternal Grandfather        brain   Diabetes Neg Hx    Stroke Neg Hx    Colon cancer Neg Hx    Prostate cancer Neg Hx     Social History   Socioeconomic History   Marital status: Married    Spouse name: emily   Number of children: 2   Years of education: Not on file   Highest education level: GED or equivalent  Occupational History   Occupation: [employer]    Comment: full time  Tobacco Use   Smoking status: Former  Current packs/day: 1.00    Average packs/day: 1 pack/day for 14.0 years (14.0 ttl pk-yrs)    Types: Cigarettes   Smokeless tobacco: Never   Tobacco comments:    patient relapsed  Vaping Use   Vaping status: Never Used  Substance and Sexual Activity   Alcohol use: No    Alcohol/week: 0.0 standard drinks of alcohol    Comment: occasional   Drug use: No   Sexual activity: Yes    Birth control/protection: None  Other Topics Concern   Not on file  Social History Narrative   Not on file   Social Determinants of Health   Financial Resource Strain: High Risk (01/27/2017)   Overall Financial Resource Strain (CARDIA)    Difficulty of Paying Living Expenses: Very hard  Food Insecurity: Food  Insecurity Present (01/27/2017)   Hunger Vital Sign    Worried About Running Out of Food in the Last Year: Sometimes true    Ran Out of Food in the Last Year: Sometimes true  Transportation Needs: No Transportation Needs (01/27/2017)   PRAPARE - Administrator, Civil Service (Medical): No    Lack of Transportation (Non-Medical): No  Physical Activity: Sufficiently Active (01/27/2017)   Exercise Vital Sign    Days of Exercise per Week: 3 days    Minutes of Exercise per Session: 120 min  Stress: Stress Concern Present (01/27/2017)   [employer] of Occupational Health - Occupational Stress Questionnaire    Feeling of Stress : To some extent  Social Connections: Somewhat Isolated (01/27/2017)   Social Connection and Isolation Panel [NHANES]    Frequency of Communication with Friends and Family: More than three times a week    Frequency of Social Gatherings with Friends and Family: Once a week    Attends Religious Services: Never    Database administrator or Organizations: No    Attends Banker Meetings: Never    Marital Status: Married  Catering manager Violence: Not At Risk (01/27/2017)   Humiliation, Afraid, Rape, and Kick questionnaire    Fear of Current or Ex-Partner: No    Emotionally Abused: No    Physically Abused: No    Sexually Abused: No     Constitutional: Patient reports headache.  Denies fever, malaise, fatigue, or abrupt weight changes.  HEENT: Patient reports sinus pressure, runny nose, ear pain, nasal congestion and sore throat.  Denies eye pain, eye redness, ringing in the ears, wax buildup, bloody nose. Respiratory: Denies difficulty breathing, shortness of breath, cough or sputum production.   Cardiovascular: Denies chest pain, chest tightness, palpitations or swelling in the hands or feet.  Gastrointestinal: Denies abdominal pain, bloating, constipation, diarrhea or blood in the stool.  GU: Denies urgency, frequency, pain with urination,  burning sensation, blood in urine, odor or discharge. Musculoskeletal: Denies decrease in range of motion, difficulty with gait, muscle pain or joint pain and swelling.  Skin: Patient reports skin lesion of neck, multiple moles.  Denies redness, rashes, or ulcercations.  Neurological: Patient reports inattention.  Denies dizziness, difficulty with memory, difficulty with speech or problems with balance and coordination.  Psych: Patient has a history of anxiety and depression.  Denies SI/HI.  No other specific complaints in a complete review of systems (except as listed in HPI above).    Observations/Objective:  Wt Readings from Last 3 Encounters:  12/09/22 196 lb (88.9 kg)  06/10/22 199 lb (90.3 kg)  10/23/21 162 lb (73.5 kg)    General: Appears his  stated age, well developed, well nourished in NAD. ENT: Congestion noted.  Hoarseness noted. Skin: Able to review skin lesion of neck during this video visit. Pulmonary/Chest: Normal effort. No respiratory distress.  Neurological: Alert and oriented.  Coordination normal.  Psychiatric: Mood and affect normal. Behavior is normal. Judgment and thought content normal.     BMET    Component Value Date/Time   NA 136 10/05/2019 1334   K 4.4 10/05/2019 1334   CL 100 10/05/2019 1334   CO2 27 10/05/2019 1334   GLUCOSE 130 (H) 10/05/2019 1334   BUN 22 10/05/2019 1334   CREATININE 0.90 10/05/2019 1334   CALCIUM 9.6 10/05/2019 1334   GFRNONAA >60 01/08/2017 1239   GFRAA >60 01/08/2017 1239    Lipid Panel     Component Value Date/Time   CHOL 179 10/05/2019 1334   TRIG 100.0 10/05/2019 1334   HDL 40.10 10/05/2019 1334   CHOLHDL 4 10/05/2019 1334   VLDL 20.0 10/05/2019 1334   LDLCALC 119 (H) 10/05/2019 1334    CBC    Component Value Date/Time   WBC 14.7 (H) 10/05/2019 1334   RBC 4.79 10/05/2019 1334   HGB 14.3 10/05/2019 1334   HCT 42.5 10/05/2019 1334   PLT 337.0 10/05/2019 1334   MCV 88.8 10/05/2019 1334   MCH 29.2  01/08/2017 1239   MCHC 33.6 10/05/2019 1334   RDW 13.8 10/05/2019 1334    Hgb A1C No results found for: "HGBA1C"      Assessment and Plan:  Assessment and Plan    Sinusitis Symptoms of sinus pressure, headache, runny nose, nasal congestion, and ear pain for approximately 1.5-2 weeks. Initial fever resolved with over-the-counter medication. Negative for COVID-19. -Prescribe Augmentin twice daily. -Recommend over-the-counter Flonase to alleviate congestion.  Attention Deficit Hyperactivity Disorder (ADHD) Patient reports developing tolerance to current Adderall regimen, experiencing fatigue by late afternoon. -Increase Adderall dose to allow for an additional dose at 4pm.  Dermatology Referral Patient requests referral for removal of neck lesion and evaluation of three moles. -Submit referral to dermatology.    RTC in 3 months for follow-up of chronic conditions  Follow Up Instructions:    I discussed the assessment and treatment plan with the patient. The patient was provided an opportunity to ask questions and all were answered. The patient agreed with the plan and demonstrated an understanding of the instructions.   The patient was advised to call back or seek an in-person evaluation if the symptoms worsen or if the condition fails to improve as anticipated.   Nicki Reaper, NP

## 2023-03-16 ENCOUNTER — Other Ambulatory Visit: Payer: Self-pay | Admitting: Internal Medicine

## 2023-03-17 MED ORDER — AMPHETAMINE-DEXTROAMPHET ER 20 MG PO CP24: 20.0000 mg | ORAL_CAPSULE | Freq: Every day | ORAL | 0 refills | Status: DC

## 2023-03-24 ENCOUNTER — Telehealth: Payer: Medicaid Other | Admitting: Physician Assistant

## 2023-03-24 DIAGNOSIS — J069 Acute upper respiratory infection, unspecified: Secondary | ICD-10-CM

## 2023-03-24 NOTE — Progress Notes (Signed)
Because of needing a return to work note for a week of missed work without being seen, I feel your condition warrants further evaluation and I recommend that you be seen for a face to face visit.  Please contact your primary care physician practice to be seen. Many offices offer virtual options to be seen via video if you are not comfortable going in person to a medical facility at this time.  NOTE: You will NOT be charged for this eVisit.  If you do not have a PCP, [facility] offers a free physician referral service available at [phone]. Our trained staff has the experience, knowledge and resources to put you in touch with a physician who is right for you.    If you are having a true medical emergency please call 911.   Your e-visit answers were reviewed by a board certified advanced clinical practitioner to complete your personal care plan.  Thank you for using e-Visits.   I have spent 5 minutes in review of e-visit questionnaire, review and updating patient chart, medical decision making and response to patient.   Margaretann Loveless, PA-C

## 2023-03-25 DIAGNOSIS — J069 Acute upper respiratory infection, unspecified: Secondary | ICD-10-CM | POA: Diagnosis not present

## 2023-03-27 ENCOUNTER — Telehealth: Payer: Medicaid Other | Admitting: Internal Medicine

## 2023-03-27 DIAGNOSIS — Z91199 Patient's noncompliance with other medical treatment and regimen due to unspecified reason: Secondary | ICD-10-CM

## 2023-03-27 NOTE — Progress Notes (Signed)
 Pt cancelled/no showed within 24 hours

## 2023-04-01 ENCOUNTER — Encounter: Payer: Self-pay | Admitting: Internal Medicine

## 2023-04-02 ENCOUNTER — Other Ambulatory Visit: Payer: Self-pay | Admitting: Internal Medicine

## 2023-04-02 MED ORDER — AMPHETAMINE-DEXTROAMPHETAMINE 10 MG PO TABS: ORAL_TABLET | ORAL | 0 refills | Status: DC

## 2023-04-03 DIAGNOSIS — L738 Other specified follicular disorders: Secondary | ICD-10-CM | POA: Diagnosis not present

## 2023-04-03 DIAGNOSIS — D224 Melanocytic nevi of scalp and neck: Secondary | ICD-10-CM | POA: Diagnosis not present

## 2023-04-03 DIAGNOSIS — D2262 Melanocytic nevi of left upper limb, including shoulder: Secondary | ICD-10-CM | POA: Diagnosis not present

## 2023-04-03 DIAGNOSIS — D2239 Melanocytic nevi of other parts of face: Secondary | ICD-10-CM | POA: Diagnosis not present

## 2023-04-03 DIAGNOSIS — D234 Other benign neoplasm of skin of scalp and neck: Secondary | ICD-10-CM | POA: Diagnosis not present

## 2023-04-03 DIAGNOSIS — D485 Neoplasm of uncertain behavior of skin: Secondary | ICD-10-CM | POA: Diagnosis not present

## 2023-04-03 DIAGNOSIS — D2272 Melanocytic nevi of left lower limb, including hip: Secondary | ICD-10-CM | POA: Diagnosis not present

## 2023-04-03 DIAGNOSIS — D225 Melanocytic nevi of trunk: Secondary | ICD-10-CM | POA: Diagnosis not present

## 2023-04-03 DIAGNOSIS — R208 Other disturbances of skin sensation: Secondary | ICD-10-CM | POA: Diagnosis not present

## 2023-04-03 DIAGNOSIS — D2261 Melanocytic nevi of right upper limb, including shoulder: Secondary | ICD-10-CM | POA: Diagnosis not present

## 2023-04-03 DIAGNOSIS — D2271 Melanocytic nevi of right lower limb, including hip: Secondary | ICD-10-CM | POA: Diagnosis not present

## 2023-04-17 ENCOUNTER — Other Ambulatory Visit: Payer: Self-pay | Admitting: Internal Medicine

## 2023-04-17 MED ORDER — AMPHETAMINE-DEXTROAMPHET ER 20 MG PO CP24: 20.0000 mg | ORAL_CAPSULE | Freq: Every day | ORAL | 0 refills | Status: DC

## 2023-04-27 ENCOUNTER — Emergency Department: Payer: Medicaid Other

## 2023-04-27 ENCOUNTER — Emergency Department
Admission: EM | Admit: 2023-04-27 | Discharge: 2023-04-27 | Disposition: A | Discharge status: Home / Self Care | Payer: Medicaid Other | Attending: Emergency Medicine | Admitting: Emergency Medicine

## 2023-04-27 ENCOUNTER — Other Ambulatory Visit: Payer: Self-pay

## 2023-04-27 DIAGNOSIS — S6991XA Unspecified injury of right wrist, hand and finger(s), initial encounter: Secondary | ICD-10-CM | POA: Diagnosis present

## 2023-04-27 DIAGNOSIS — I10 Essential (primary) hypertension: Secondary | ICD-10-CM | POA: Insufficient documentation

## 2023-04-27 DIAGNOSIS — S60221A Contusion of right hand, initial encounter: Secondary | ICD-10-CM | POA: Insufficient documentation

## 2023-04-27 DIAGNOSIS — W230XXA Caught, crushed, jammed, or pinched between moving objects, initial encounter: Secondary | ICD-10-CM | POA: Insufficient documentation

## 2023-04-27 DIAGNOSIS — M7989 Other specified soft tissue disorders: Secondary | ICD-10-CM | POA: Diagnosis not present

## 2023-04-27 DIAGNOSIS — M79641 Pain in right hand: Secondary | ICD-10-CM | POA: Diagnosis not present

## 2023-04-27 NOTE — ED Provider Notes (Signed)
[facility] Provider Note    Event Date/Time   First MD Initiated Contact with Patient 04/27/23 1348     (approximate)   History   Hand Injury   HPI  Peter Kramer is a 43 y.o. male who presents today for evaluation of right hand injury.  Patient reports that he accidentally slammed his hand in a door yesterday.  He reports that his hand was not stuck for more than half a second.  He reports that he continued to have pain today prompting him to come in for evaluation.  He denies numbness or tingling.  He reports that he still able to move his fingers.  No wrist pain.  He reports that his tetanus is up-to-date.  Patient Active Problem List   Diagnosis Date Noted   Adjustment reaction with anxiety and depression 07/13/2021   Pure hypercholesterolemia 07/13/2021   Neutrophilia 07/13/2021   Overweight with body mass index (BMI) of 28 to 28.9 in adult 07/13/2021   GERD (gastroesophageal reflux disease) 09/28/2019   Attention deficit disorder 01/27/2017   HTN (hypertension) 08/08/2016   Genital herpes 11/18/2014          Physical Exam   Triage Vital Signs: ED Triage Vitals  Encounter Vitals Group     BP 04/27/23 1342 (!) 147/103     Systolic BP Percentile --      Diastolic BP Percentile --      Pulse Rate 04/27/23 1342 (!) 109     Resp 04/27/23 1342 18     Temp 04/27/23 1342 98 F (36.7 C)     Temp src --      SpO2 04/27/23 1342 97 %     Weight --      Height --      Head Circumference --      Peak Flow --      Pain Score 04/27/23 1341 10     Pain Loc --      Pain Education --      Exclude from Growth Chart --     Most recent vital signs: Vitals:   04/27/23 1342  BP: (!) 147/103  Pulse: (!) 109  Resp: 18  Temp: 98 F (36.7 C)  SpO2: 97%    Physical Exam Vitals and nursing note reviewed.  Constitutional:      General: Awake and alert. No acute distress.    Appearance: Normal appearance. The patient is normal weight.  HENT:      Head: Normocephalic and atraumatic.     Mouth: Mucous membranes are moist.  Eyes:     General: PERRL. Normal EOMs        Right eye: No discharge.        Left eye: No discharge.     Conjunctiva/sclera: Conjunctivae normal.  Cardiovascular:     Rate and Rhythm: Normal rate and regular rhythm.     Pulses: Normal pulses.  Pulmonary:     Effort: Pulmonary effort is normal. No respiratory distress.     Breath sounds: Normal breath sounds.  Abdominal:     Abdomen is soft. There is no abdominal tenderness. No rebound or guarding. No distention. Musculoskeletal:        General: No swelling. Normal range of motion.     Cervical back: Normal range of motion and neck supple.  Right hand: Soft tissue swelling over the dorsum of the hand with very superficial abrasion over the fourth and fifth MCP.  No obvious deformity.  Patient is able to give thumbs up, cross fingers, make okay sign and hold closed against resistance.  Able to flex and extend at the wrist against resistance.  No tenderness to the forearm, elbow, humerus, or shoulder.  Normal radial pulse.  Compartments are soft and compressible throughout.  No tenderness or palmar abnormalities. Skin:    General: Skin is warm and dry.     Capillary Refill: Capillary refill takes less than 2 seconds.     Findings: No rash.  Neurological:     Mental Status: The patient is awake and alert.      ED Results / Procedures / Treatments   Labs (all labs ordered are listed, but only abnormal results are displayed) Labs Reviewed - No data to display   EKG     RADIOLOGY I independently reviewed and interpreted imaging and agree with radiologists findings.     PROCEDURES:  Critical Care performed:   Procedures   MEDICATIONS ORDERED IN ED: Medications - No data to display   IMPRESSION / MDM / ASSESSMENT AND PLAN / ED COURSE  I reviewed the triage vital signs and the nursing notes.   Differential diagnosis includes, but is not  limited to, contusion, fracture, dislocation.  Patient is awake and alert, hemodynamically stable and afebrile, though mildly anxious in appearance.  He has normal radial pulse.  He has normal intrinsic muscle function of the hand.  He has superficial abrasions reports that his Tdap is up-to-date, no active bleeding, no repairs necessary.  No tenderness to his wrist, forearm, elbow, or elsewhere in his arm.  Compartments are soft and compressible, not consistent with compartment syndrome.  Sensation is intact light touch throughout.  Normal capillary refill.  X-ray obtained is negative for any acute osseous injury.  Patient is reassured by these findings.  He was given an Ace wrap for extra support.  We discussed the importance of rest, ice, elevation.  He was also given the information for orthopedics for this process.  He was offered a work note but declined.  He understands return precautions.  Patient was discharged in stable condition.   Patient's presentation is most consistent with acute complicated illness / injury requiring diagnostic workup.    FINAL CLINICAL IMPRESSION(S) / ED DIAGNOSES   Final diagnoses:  Contusion of right hand, initial encounter     Rx / DC Orders   ED Discharge Orders     None        Note:  This document was prepared using Dragon voice recognition software and may include unintentional dictation errors.   Keturah Shavers 04/27/23 1503    Jene Every, MD 04/27/23 [phone]

## 2023-04-27 NOTE — ED Notes (Signed)
 See triage note.

## 2023-04-27 NOTE — ED Notes (Signed)
Ace wrap applied, CMS intact.

## 2023-04-27 NOTE — ED Triage Notes (Signed)
Pt comes with right hand injury yesterday. Pt states it was large industrial door and someone slammed it on his hand. Pt has swelling noted to hand.

## 2023-04-27 NOTE — Discharge Instructions (Signed)
Your x-ray was negative for any broken bones.  You were given an Ace wrap for extra support.  Please continue to rest, ice, elevate your hand.  You may take Tylenol/ibuprofen per package instructions to help with your symptoms.  Please return for any new, worsening, or change in symptoms or other concerns.  It was a pleasure caring for you today.

## 2023-04-27 NOTE — ED Provider Triage Note (Signed)
Emergency Medicine Provider Triage Evaluation Note  Peter Kramer , a 43 y.o. male  was evaluated in triage.  Pt complains of right hand pain.  Patient reports that his hand got slammed in an industrial door.  No numbness or tingling.  Reports that this happened yesterday but he still had pain today prompting him to come in for evaluation.  Tetanus up-to-date.  Review of Systems  Positive: Right hand pain Negative: Paresthesias or weakness  Physical Exam  There were no vitals taken for this visit. Gen:   Awake, no distress   Resp:  Normal effort  MSK:   Moves extremities without difficulty  Other:  Swelling to the dorsum of the right hand, superficial abrasions over fourth and fifth knuckles.  Able to give thumbs up, cross fingers, make okay sign and hold closed against resistance.  No tenderness to the wrist.  Medical Decision Making  Medically screening exam initiated at 1:41 PM.  Appropriate orders placed.  [name] was informed that the remainder of the evaluation will be completed by another provider, this initial triage assessment does not replace that evaluation, and the importance of remaining in the ED until their evaluation is complete.     Jackelyn Hoehn, PA-C 04/27/23 1343

## 2023-05-01 ENCOUNTER — Other Ambulatory Visit: Payer: Self-pay | Admitting: Internal Medicine

## 2023-05-02 MED ORDER — AMPHETAMINE-DEXTROAMPHETAMINE 10 MG PO TABS: ORAL_TABLET | ORAL | 0 refills | Status: DC

## 2023-05-16 ENCOUNTER — Other Ambulatory Visit: Payer: Self-pay | Admitting: Internal Medicine

## 2023-05-19 ENCOUNTER — Other Ambulatory Visit: Payer: Self-pay | Admitting: Internal Medicine

## 2023-05-19 MED ORDER — AMPHETAMINE-DEXTROAMPHET ER 20 MG PO CP24: 20.0000 mg | ORAL_CAPSULE | Freq: Every day | ORAL | 0 refills | Status: DC

## 2023-05-19 NOTE — Telephone Encounter (Signed)
 Unable to refill per protocol, request too soon for refill. Last refill 02/06/23 for 90 and 1 refill.  Requested Prescriptions  Pending Prescriptions Disp Refills   citalopram (CELEXA) 40 MG tablet [Pharmacy Med Name: Citalopram Hydrobromide 40 MG Oral Tablet] 90 tablet 0    Sig: TAKE 1 TABLET BY MOUTH ONCE DAILY (SCHEDULE  AN  OFFICE  VISIT  BEFORE  ANYMORE  REFILLS)     Psychiatry:  Antidepressants - SSRI Passed - 05/19/2023 11:21 AM      Passed - Valid encounter within last 6 months    Recent Outpatient Visits           1 month ago No-show for appointment   [place] [facility] [place], Salvadore Oxford, NP   2 months ago Skin lesion of neck   [facility] [place], Salvadore Oxford, NP   5 months ago Encounter for general adult medical examination with abnormal findings   [place] [facility] [place], Salvadore Oxford, NP   11 months ago Primary hypertension   [place] [facility] [place], Salvadore Oxford, NP   1 year ago No-show for appointment   [facility] [place], Salvadore Oxford, NP       Future Appointments             In 3 weeks Sampson Si, Salvadore Oxford, NP [place] [facility], [facility]

## 2023-05-20 ENCOUNTER — Other Ambulatory Visit: Payer: Self-pay | Admitting: Internal Medicine

## 2023-05-20 DIAGNOSIS — I10 Essential (primary) hypertension: Secondary | ICD-10-CM

## 2023-05-21 NOTE — Telephone Encounter (Signed)
 Requested medication (s) are due for refill today: Yes  Requested medication (s) are on the active medication list: Yes  Last refill:  12/09/22  Future visit scheduled: Yes  Notes to clinic:  Unable to refill per protocol due to failed labs, no updated results.      Requested Prescriptions  Pending Prescriptions Disp Refills   lisinopril (ZESTRIL) 10 MG tablet [Pharmacy Med Name: Lisinopril 10 MG Oral Tablet] 90 tablet 0    Sig: Take 1 tablet by mouth once daily     Cardiovascular:  ACE Inhibitors Failed - 05/21/2023  1:07 PM      Failed - Cr in normal range and within 180 days    Creatinine, Ser  Date Value Ref Range Status  10/05/2019 0.90 0.40 - 1.50 mg/dL Final         Failed - K in normal range and within 180 days    Potassium  Date Value Ref Range Status  10/05/2019 4.4 3.5 - 5.1 mEq/L Final         Failed - Last BP in normal range    BP Readings from Last 1 Encounters:  04/27/23 (!) 147/103         Passed - Patient is not pregnant      Passed - Valid encounter within last 6 months    Recent Outpatient Visits           1 month ago No-show for appointment   [place] [facility] [place], Salvadore Oxford, NP   2 months ago Skin lesion of neck   [place] [facility] [place], Salvadore Oxford, NP   5 months ago Encounter for general adult medical examination with abnormal findings   [place] [facility] [place], Salvadore Oxford, NP   11 months ago Primary hypertension   [place] [facility] [place], Salvadore Oxford, NP   1 year ago No-show for appointment   [facility] [place], Salvadore Oxford, NP       Future Appointments             In 2 weeks Sampson Si, Salvadore Oxford, NP  [facility]

## 2023-05-28 ENCOUNTER — Other Ambulatory Visit: Payer: Self-pay | Admitting: Internal Medicine

## 2023-05-28 ENCOUNTER — Encounter: Payer: Self-pay | Admitting: Internal Medicine

## 2023-05-29 MED ORDER — AMPHETAMINE-DEXTROAMPHETAMINE 10 MG PO TABS: ORAL_TABLET | ORAL | 0 refills | Status: DC

## 2023-06-09 ENCOUNTER — Ambulatory Visit: Payer: Self-pay | Admitting: Internal Medicine

## 2023-06-09 ENCOUNTER — Encounter: Payer: Self-pay | Admitting: Internal Medicine

## 2023-06-09 VITALS — BP 140/90 | Ht 70.0 in | Wt 191.6 lb

## 2023-06-09 DIAGNOSIS — Z6827 Body mass index (BMI) 27.0-27.9, adult: Secondary | ICD-10-CM

## 2023-06-09 DIAGNOSIS — K219 Gastro-esophageal reflux disease without esophagitis: Secondary | ICD-10-CM | POA: Diagnosis not present

## 2023-06-09 DIAGNOSIS — F4323 Adjustment disorder with mixed anxiety and depressed mood: Secondary | ICD-10-CM | POA: Diagnosis not present

## 2023-06-09 DIAGNOSIS — R739 Hyperglycemia, unspecified: Secondary | ICD-10-CM | POA: Diagnosis not present

## 2023-06-09 DIAGNOSIS — R2231 Localized swelling, mass and lump, right upper limb: Secondary | ICD-10-CM | POA: Diagnosis not present

## 2023-06-09 DIAGNOSIS — E7800 Pure hypercholesterolemia, unspecified: Secondary | ICD-10-CM | POA: Diagnosis not present

## 2023-06-09 DIAGNOSIS — I10 Essential (primary) hypertension: Secondary | ICD-10-CM

## 2023-06-09 DIAGNOSIS — A6001 Herpesviral infection of penis: Secondary | ICD-10-CM

## 2023-06-09 DIAGNOSIS — M79641 Pain in right hand: Secondary | ICD-10-CM | POA: Diagnosis not present

## 2023-06-09 DIAGNOSIS — J343 Hypertrophy of nasal turbinates: Secondary | ICD-10-CM

## 2023-06-09 DIAGNOSIS — Z3009 Encounter for other general counseling and advice on contraception: Secondary | ICD-10-CM | POA: Diagnosis not present

## 2023-06-09 DIAGNOSIS — D72828 Other elevated white blood cell count: Secondary | ICD-10-CM | POA: Diagnosis not present

## 2023-06-09 DIAGNOSIS — F900 Attention-deficit hyperactivity disorder, predominantly inattentive type: Secondary | ICD-10-CM

## 2023-06-09 MED ORDER — LISINOPRIL 20 MG PO TABS: 20.0000 mg | ORAL_TABLET | Freq: Every day | ORAL | 1 refills | Status: DC

## 2023-06-09 MED ORDER — BUPROPION HCL ER (XL) 150 MG PO TB24
150.0000 mg | ORAL_TABLET | Freq: Every day | ORAL | 1 refills | Status: DC
Start: 2023-06-09 — End: 2023-11-06

## 2023-06-09 MED ORDER — PREDNISONE 10 MG PO TABS: ORAL_TABLET | ORAL | 0 refills | Status: DC

## 2023-06-09 MED ORDER — CITALOPRAM HYDROBROMIDE 40 MG PO TABS: ORAL_TABLET | ORAL | 1 refills | Status: DC

## 2023-06-09 MED ORDER — AMPHETAMINE-DEXTROAMPHET ER 20 MG PO CP24: 40.0000 mg | ORAL_CAPSULE | Freq: Every day | ORAL | Status: DC

## 2023-06-09 NOTE — Assessment & Plan Note (Signed)
 CBC today.

## 2023-06-09 NOTE — Assessment & Plan Note (Signed)
Continue Valcyclovir as needed

## 2023-06-09 NOTE — Progress Notes (Signed)
 Subjective:    Patient ID: Peter Kramer, male    DOB: November 30, 1980, 43 y.o.   MRN: [id number]  HPI  Patient presents to clinic today for follow-up of chronic conditions.  HTN: His BP today is 144/98.  He is taking lisinopril as prescribed.  There is no ECG on file.  GERD: Triggered by random foods.  He takes pepto-bismol as needed with some relief of symptoms.  There is no upper GI on file.  Genital herpes: He denies recent outbreak and questions if he has ever been truly diagnosed with this.  He takes valacyclovir as needed for outbreaks.  Anxiety and depression: Chronic, managed on bupropion and citalopram.  He is not currently seeing a therapist.  He denies SI/HI.  ADHD: He reports mainly inattention and difficulty with motivation.  He is taking adderall as prescribed.  He does not follow with psychiatry.  HLD: His last LDL was 119, triglycerides 100, 09/2019.  He is not taking any cholesterol-lowering medication at this time.  He does not consume low-fat diet.  Leukocytosis: His last WBC count was 14.7, 09/2019.  He does smoke.  He does not follow with hematology.  He reports persistent nasal congestion.  He reports every morning when he wakes up he blows green discharge out of his nose that has a foul odor.  He does not consistently take allergy medicines.  He does not smoke.  He also reports right hand pain.  He reports this started back in February after an injury.  He was seen in the ER for the same and was advised that he did not have a fracture.  He reports persistent pain and swelling in the right hand.  He reports intermittent this and tingling.  He would also like a referral vasectomy.  He no longer wants to have any children.  Review of Systems     Past Medical History:  Diagnosis Date   ADHD (attention deficit hyperactivity disorder)    Chicken pox    Essential hypertension 08/08/2016   Generalized anxiety disorder 07/17/2015   Genital herpes    Smoker 11/18/2014     Current Outpatient Medications  Medication Sig Dispense Refill   amoxicillin-clavulanate (AUGMENTIN) 875-125 MG tablet Take 1 tablet by mouth 2 (two) times daily. 20 tablet 0   amphetamine-dextroamphetamine (ADDERALL XR) 20 MG 24 hr capsule Take 1 capsule (20 mg total) by mouth daily. 30 capsule 0   amphetamine-dextroamphetamine (ADDERALL) 10 MG tablet Take 1 tab PO at noon and 1 tab PO at 4 pm 60 tablet 0   buPROPion (WELLBUTRIN XL) 150 MG 24 hr tablet Take 1 tablet (150 mg total) by mouth daily. 90 tablet 1   citalopram (CELEXA) 40 MG tablet TAKE 1 TABLET BY MOUTH ONCE DAILY (SCHEDULE  AN  OFFICE  VISIT  BEFORE  ANYMORE  REFILLS) 90 tablet 1   lisinopril (ZESTRIL) 10 MG tablet Take 1 tablet by mouth once daily 90 tablet 0   valACYclovir (VALTREX) 500 MG tablet Take 1 tablet (500 mg total) by mouth 2 (two) times daily as needed. 30 tablet 0   No current facility-administered medications for this visit.    Allergies  Allergen Reactions   Bee Venom Swelling    Family History  Problem Relation Age of Onset   ADD / ADHD Father    ADD / ADHD Sister    ADD / ADHD Sister    Heart disease Maternal Grandmother    Cancer Paternal Grandfather  brain   Diabetes Neg Hx    Stroke Neg Hx    Colon cancer Neg Hx    Prostate cancer Neg Hx     Social History   Socioeconomic History   Marital status: Married    Spouse name: emily   Number of children: 2   Years of education: Not on file   Highest education level: GED or equivalent  Occupational History   Occupation: [employer]    Comment: full time  Tobacco Use   Smoking status: Former    Current packs/day: 1.00    Average packs/day: 1 pack/day for 14.0 years (14.0 ttl pk-yrs)    Types: Cigarettes   Smokeless tobacco: Never   Tobacco comments:    patient relapsed  Vaping Use   Vaping status: Never Used  Substance and Sexual Activity   Alcohol use: No    Alcohol/week: 0.0 standard drinks of alcohol    Comment:  occasional   Drug use: No   Sexual activity: Yes    Birth control/protection: None  Other Topics Concern   Not on file  Social History Narrative   Not on file   Social Drivers of Health   Financial Resource Strain: High Risk (01/27/2017)   Overall Financial Resource Strain (CARDIA)    Difficulty of Paying Living Expenses: Very hard  Food Insecurity: Food Insecurity Present (01/27/2017)   Hunger Vital Sign    Worried About Running Out of Food in the Last Year: Sometimes true    Ran Out of Food in the Last Year: Sometimes true  Transportation Needs: No Transportation Needs (01/27/2017)   PRAPARE - Administrator, Civil Service (Medical): No    Lack of Transportation (Non-Medical): No  Physical Activity: Sufficiently Active (01/27/2017)   Exercise Vital Sign    Days of Exercise per Week: 3 days    Minutes of Exercise per Session: 120 min  Stress: Stress Concern Present (01/27/2017)   [employer] of Occupational Health - Occupational Stress Questionnaire    Feeling of Stress : To some extent  Social Connections: Somewhat Isolated (01/27/2017)   Social Connection and Isolation Panel [NHANES]    Frequency of Communication with Friends and Family: More than three times a week    Frequency of Social Gatherings with Friends and Family: Once a week    Attends Religious Services: Never    Database administrator or Organizations: No    Attends Banker Meetings: Never    Marital Status: Married  Catering manager Violence: Not At Risk (01/27/2017)   Humiliation, Afraid, Rape, and Kick questionnaire    Fear of Current or Ex-Partner: No    Emotionally Abused: No    Physically Abused: No    Sexually Abused: No     Constitutional: Denies fever, malaise, fatigue, headache or abrupt weight changes.  HEENT: Pt reports nasal congestion. Denies eye pain, eye redness, ear pain, ringing in the ears, wax buildup, runny nose, bloody nose, or sore throat. Respiratory:  Denies difficulty breathing, shortness of breath, cough or sputum production.   Cardiovascular: Denies chest pain, chest tightness, palpitations or swelling in the hands or feet.  Gastrointestinal: Denies abdominal pain, bloating, constipation, diarrhea or blood in the stool.  GU: Denies urgency, frequency, pain with urination, burning sensation, blood in urine, odor or discharge. Musculoskeletal:  Pt reports right hand pain and. Denies decrease in range of motion, difficulty with gait, muscle pain.  Skin: Denies redness, rashes, lesions or ulcercations.  Neurological: Patient  reports inattention, paresthesia of right hand.  Denies dizziness, difficulty with memory, difficulty with speech or problems with balance and coordination.  Psych: Patient has a history of anxiety and depression.  Denies SI/HI.  No other specific complaints in a complete review of systems (except as listed in HPI above).  Objective:   Physical Exam BP (!) 144/98 (BP Location: Left Arm, Patient Position: Sitting, Cuff Size: Normal)   Ht 5\' 10"  (1.778 m)   Wt 191 lb 9.6 oz (86.9 kg)   BMI 27.49 kg/m    Wt Readings from Last 3 Encounters:  12/09/22 196 lb (88.9 kg)  06/10/22 199 lb (90.3 kg)  10/23/21 162 lb (73.5 kg)    General: Appears his stated age, overweight, in NAD. Skin: Warm, dry and intact. No rashes noted. HEENT: Head: Normal shape and size; Nose: Turbinate hypertrophy on the right, septum midline Cardiovascular: Normal rate and rhythm.  Radial pulse 2+ on the right. Pulmonary/Chest: Normal effort and positive vesicular breath sounds. No respiratory distress. No wheezes, rales or ronchi noted.  Abdomen:  Normal bowel sounds.  Musculoskeletal: Normal flexion, extension and rotation of the right wrist.  Pain with rotation of the right wrist.  Trace swelling noted over the third and fourth proximal metacarpals.  Pain with palpation over the third and fourth proximal metacarpals.  Handgrips equal.  No  difficulty with gait.  Neurological: Alert and oriented.  Positive Phalen's and Tinel's on the right.  Coordination normal.  Psychiatric: Mood and affect normal. Behavior is normal. Judgment and thought content normal.    BMET    Component Value Date/Time   NA 136 10/05/2019 1334   K 4.4 10/05/2019 1334   CL 100 10/05/2019 1334   CO2 27 10/05/2019 1334   GLUCOSE 130 (H) 10/05/2019 1334   BUN 22 10/05/2019 1334   CREATININE 0.90 10/05/2019 1334   CALCIUM 9.6 10/05/2019 1334   GFRNONAA >60 01/08/2017 1239   GFRAA >60 01/08/2017 1239    Lipid Panel     Component Value Date/Time   CHOL 179 10/05/2019 1334   TRIG 100.0 10/05/2019 1334   HDL 40.10 10/05/2019 1334   CHOLHDL 4 10/05/2019 1334   VLDL 20.0 10/05/2019 1334   LDLCALC 119 (H) 10/05/2019 1334    CBC    Component Value Date/Time   WBC 14.7 (H) 10/05/2019 1334   RBC 4.79 10/05/2019 1334   HGB 14.3 10/05/2019 1334   HCT 42.5 10/05/2019 1334   PLT 337.0 10/05/2019 1334   MCV 88.8 10/05/2019 1334   MCH 29.2 01/08/2017 1239   MCHC 33.6 10/05/2019 1334   RDW 13.8 10/05/2019 1334    Hgb A1C No results found for: "HGBA1C"        Assessment & Plan:   Right hand pain and swelling:  Rx for Pred taper x 6 days He is wearing a brace today, advised him to continue this if it provides support and comfort Recommend ice for 10 minutes twice daily Repeat x-ray if symptoms persist or worsen  Desires sterilization:  Referral to urology for consideration of vasectomy  Nasal turbinate hypertrophy:  Recommend Flonase 1 spray daily Referral to ENT for further evaluation  RTC in 2 weeks, follow-up HTN 6 months for your annual exam Nicki Reaper, NP

## 2023-06-09 NOTE — Assessment & Plan Note (Signed)
C-Met and lipid profile today Encouraged low-fat diet

## 2023-06-09 NOTE — Assessment & Plan Note (Signed)
Continue buproprion and citalopram Support offered

## 2023-06-09 NOTE — Patient Instructions (Signed)

## 2023-06-09 NOTE — Assessment & Plan Note (Signed)
 Encouraged diet and exercise for weight loss ?

## 2023-06-09 NOTE — Assessment & Plan Note (Signed)
 Try to avoid foods that trigger your reflux Encouraged weight loss as this can help reduce reflux symptoms Continue Pepto Bismol as needed

## 2023-06-09 NOTE — Assessment & Plan Note (Signed)
 Elevated today Increase lisinopril to 20 mg daily Reinforced DASH diet and exercise for weight loss C-Met today

## 2023-06-09 NOTE — Assessment & Plan Note (Addendum)
 He would like to change his Adderall dose to 20 mg XR-2 tabs daily Will discontinue Adderall 10 mg IR

## 2023-06-10 ENCOUNTER — Encounter: Payer: Self-pay | Admitting: Internal Medicine

## 2023-06-10 DIAGNOSIS — E7800 Pure hypercholesterolemia, unspecified: Secondary | ICD-10-CM

## 2023-06-10 LAB — COMPLETE METABOLIC PANEL WITH GFR
AG Ratio: 2.1 (calc) (ref 1.0–2.5)
ALT: 36 U/L (ref 9–46)
AST: 28 U/L (ref 10–40)
Albumin: 4.7 g/dL (ref 3.6–5.1)
Alkaline phosphatase (APISO): 127 U/L (ref 36–130)
BUN: 14 mg/dL (ref 7–25)
CO2: 27 mmol/L (ref 20–32)
Calcium: 9.8 mg/dL (ref 8.6–10.3)
Chloride: 99 mmol/L (ref 98–110)
Creat: 0.96 mg/dL (ref 0.60–1.29)
Globulin: 2.2 g/dL (ref 1.9–3.7)
Glucose, Bld: 87 mg/dL (ref 65–99)
Potassium: 4.2 mmol/L (ref 3.5–5.3)
Sodium: 138 mmol/L (ref 135–146)
Total Bilirubin: 0.4 mg/dL (ref 0.2–1.2)
Total Protein: 6.9 g/dL (ref 6.1–8.1)
eGFR: 102 mL/min/{1.73_m2} (ref >=60)

## 2023-06-10 LAB — CBC
HCT: 49.6 % (ref 38.5–50.0)
Hemoglobin: 16.4 g/dL (ref 13.2–17.1)
MCH: 29.1 pg (ref 27.0–33.0)
MCHC: 33.1 g/dL (ref 32.0–36.0)
MCV: 87.9 fL (ref 80.0–100.0)
MPV: 10.7 fL (ref 7.5–12.5)
Platelets: 326 10*3/uL (ref 140–400)
RBC: 5.64 10*6/uL (ref 4.20–5.80)
RDW: 13 % (ref 11.0–15.0)
WBC: 9.1 10*3/uL (ref 3.8–10.8)

## 2023-06-10 LAB — LIPID PANEL
Cholesterol: 244 mg/dL — ABNORMAL HIGH (ref <200)
HDL: 45 mg/dL (ref >=40)
LDL Cholesterol (Calc): 171 mg/dL — ABNORMAL HIGH
Non-HDL Cholesterol (Calc): 199 mg/dL — ABNORMAL HIGH (ref <130)
Total CHOL/HDL Ratio: 5.4 (calc) — ABNORMAL HIGH (ref <5.0)
Triglycerides: 142 mg/dL (ref <150)

## 2023-06-10 LAB — HEMOGLOBIN A1C
Hgb A1c MFr Bld: 5.6 %{Hb} (ref <5.7)
Mean Plasma Glucose: 114 mg/dL
eAG (mmol/L): 6.3 mmol/L

## 2023-06-10 MED ORDER — ATORVASTATIN CALCIUM 20 MG PO TABS: 20.0000 mg | ORAL_TABLET | Freq: Every day | ORAL | 1 refills | Status: AC

## 2023-06-12 ENCOUNTER — Other Ambulatory Visit: Payer: Self-pay | Admitting: Internal Medicine

## 2023-06-12 ENCOUNTER — Encounter: Payer: Self-pay | Admitting: Internal Medicine

## 2023-06-12 MED ORDER — AMPHETAMINE-DEXTROAMPHET ER 20 MG PO CP24: 40.0000 mg | ORAL_CAPSULE | Freq: Every day | ORAL | 0 refills | Status: DC

## 2023-06-23 ENCOUNTER — Ambulatory Visit: Admitting: Internal Medicine

## 2023-06-23 NOTE — Progress Notes (Deleted)
 Subjective:    Patient ID: Peter Kramer, male    DOB: 06-14-1980, 43 y.o.   MRN: [id number]  HPI  Patient presents to clinic today for 2-week follow-up of HTN.  At his last visit, his lisinopril was increased to 20 mg.  He has been taking the medication as prescribed.  His BP today is.  There is no ECG on file.   Review of Systems     Past Medical History:  Diagnosis Date   ADHD (attention deficit hyperactivity disorder)    Chicken pox    Essential hypertension 08/08/2016   Generalized anxiety disorder 07/17/2015   Genital herpes    Smoker 11/18/2014    Current Outpatient Medications  Medication Sig Dispense Refill   amphetamine-dextroamphetamine (ADDERALL XR) 20 MG 24 hr capsule Take 2 capsules (40 mg total) by mouth daily. 60 capsule 0   atorvastatin (LIPITOR) 20 MG tablet Take 1 tablet (20 mg total) by mouth daily. 90 tablet 1   buPROPion (WELLBUTRIN XL) 150 MG 24 hr tablet Take 1 tablet (150 mg total) by mouth daily. 90 tablet 1   citalopram (CELEXA) 40 MG tablet TAKE 1 TABLET BY MOUTH ONCE DAILY (SCHEDULE  AN  OFFICE  VISIT  BEFORE  ANYMORE  REFILLS) 90 tablet 1   lisinopril (ZESTRIL) 20 MG tablet Take 1 tablet (20 mg total) by mouth daily. 90 tablet 1   predniSONE (DELTASONE) 10 MG tablet Take 6 tabs on day 1, 5 tabs on day 2, 4 tabs on day 3, 3 tabs on day 4, 2 tabs on day 5, 1 tab on day 6 21 tablet 0   valACYclovir (VALTREX) 500 MG tablet Take 1 tablet (500 mg total) by mouth 2 (two) times daily as needed. (Patient not taking: Reported on 06/09/2023) 30 tablet 0   No current facility-administered medications for this visit.    Allergies  Allergen Reactions   Bee Venom Swelling    Family History  Problem Relation Age of Onset   ADD / ADHD Father    ADD / ADHD Sister    ADD / ADHD Sister    Heart disease Maternal Grandmother    Cancer Paternal Grandfather        brain   Diabetes Neg Hx    Stroke Neg Hx    Colon cancer Neg Hx    Prostate cancer Neg Hx      Social History   Socioeconomic History   Marital status: Married    Spouse name: emily   Number of children: 2   Years of education: Not on file   Highest education level: GED or equivalent  Occupational History   Occupation: [employer]    Comment: full time  Tobacco Use   Smoking status: Former    Current packs/day: 1.00    Average packs/day: 1 pack/day for 14.0 years (14.0 ttl pk-yrs)    Types: Cigarettes   Smokeless tobacco: Never   Tobacco comments:    patient relapsed  Vaping Use   Vaping status: Never Used  Substance and Sexual Activity   Alcohol use: No    Alcohol/week: 0.0 standard drinks of alcohol    Comment: occasional   Drug use: No   Sexual activity: Yes    Birth control/protection: None  Other Topics Concern   Not on file  Social History Narrative   Not on file   Social Drivers of Health   Financial Resource Strain: High Risk (01/27/2017)   Overall Financial Resource Strain (CARDIA)  Difficulty of Paying Living Expenses: Very hard  Food Insecurity: Food Insecurity Present (01/27/2017)   Hunger Vital Sign    Worried About Running Out of Food in the Last Year: Sometimes true    Ran Out of Food in the Last Year: Sometimes true  Transportation Needs: No Transportation Needs (01/27/2017)   PRAPARE - Administrator, Civil Service (Medical): No    Lack of Transportation (Non-Medical): No  Physical Activity: Sufficiently Active (01/27/2017)   Exercise Vital Sign    Days of Exercise per Week: 3 days    Minutes of Exercise per Session: 120 min  Stress: Stress Concern Present (01/27/2017)   [employer] of Occupational Health - Occupational Stress Questionnaire    Feeling of Stress : To some extent  Social Connections: Somewhat Isolated (01/27/2017)   Social Connection and Isolation Panel [NHANES]    Frequency of Communication with Friends and Family: More than three times a week    Frequency of Social Gatherings with Friends and Family:  Once a week    Attends Religious Services: Never    Database administrator or Organizations: No    Attends Banker Meetings: Never    Marital Status: Married  Catering manager Violence: Not At Risk (01/27/2017)   Humiliation, Afraid, Rape, and Kick questionnaire    Fear of Current or Ex-Partner: No    Emotionally Abused: No    Physically Abused: No    Sexually Abused: No     Constitutional: Denies fever, malaise, fatigue, headache or abrupt weight changes.  HEENT: Pt reports nasal congestion. Denies eye pain, eye redness, ear pain, ringing in the ears, wax buildup, runny nose, bloody nose, or sore throat. Respiratory: Denies difficulty breathing, shortness of breath, cough or sputum production.   Cardiovascular: Denies chest pain, chest tightness, palpitations or swelling in the hands or feet.  Gastrointestinal: Denies abdominal pain, bloating, constipation, diarrhea or blood in the stool.  GU: Denies urgency, frequency, pain with urination, burning sensation, blood in urine, odor or discharge. Musculoskeletal:  Pt reports right hand pain and. Denies decrease in range of motion, difficulty with gait, muscle pain.  Skin: Denies redness, rashes, lesions or ulcercations.  Neurological: Patient reports inattention, paresthesia of right hand.  Denies dizziness, difficulty with memory, difficulty with speech or problems with balance and coordination.  Psych: Patient has a history of anxiety and depression.  Denies SI/HI.  No other specific complaints in a complete review of systems (except as listed in HPI above).  Objective:   Physical Exam There were no vitals taken for this visit.   Wt Readings from Last 3 Encounters:  06/09/23 191 lb 9.6 oz (86.9 kg)  12/09/22 196 lb (88.9 kg)  06/10/22 199 lb (90.3 kg)    General: Appears his stated age, overweight, in NAD. Skin: Warm, dry and intact. No rashes noted. HEENT: Head: Normal shape and size; Nose: Turbinate hypertrophy  on the right, septum midline Cardiovascular: Normal rate and rhythm.  Radial pulse 2+ on the right. Pulmonary/Chest: Normal effort and positive vesicular breath sounds. No respiratory distress. No wheezes, rales or ronchi noted.  Abdomen:  Normal bowel sounds.  Musculoskeletal: Normal flexion, extension and rotation of the right wrist.  Pain with rotation of the right wrist.  Trace swelling noted over the third and fourth proximal metacarpals.  Pain with palpation over the third and fourth proximal metacarpals.  Handgrips equal.  No difficulty with gait.  Neurological: Alert and oriented.  Positive Phalen's and Tinel's  on the right.  Coordination normal.  Psychiatric: Mood and affect normal. Behavior is normal. Judgment and thought content normal.    BMET    Component Value Date/Time   NA 138 06/09/2023 1648   K 4.2 06/09/2023 1648   CL 99 06/09/2023 1648   CO2 27 06/09/2023 1648   GLUCOSE 87 06/09/2023 1648   BUN 14 06/09/2023 1648   CREATININE 0.96 06/09/2023 1648   CALCIUM 9.8 06/09/2023 1648   GFRNONAA >60 01/08/2017 1239   GFRAA >60 01/08/2017 1239    Lipid Panel     Component Value Date/Time   CHOL 244 (H) 06/09/2023 1648   TRIG 142 06/09/2023 1648   HDL 45 06/09/2023 1648   CHOLHDL 5.4 (H) 06/09/2023 1648   VLDL 20.0 10/05/2019 1334   LDLCALC 171 (H) 06/09/2023 1648    CBC    Component Value Date/Time   WBC 9.1 06/09/2023 1648   RBC 5.64 06/09/2023 1648   HGB 16.4 06/09/2023 1648   HCT 49.6 06/09/2023 1648   PLT 326 06/09/2023 1648   MCV 87.9 06/09/2023 1648   MCH 29.1 06/09/2023 1648   MCHC 33.1 06/09/2023 1648   RDW 13.0 06/09/2023 1648    Hgb A1C Lab Results  Component Value Date   HGBA1C 5.6 06/09/2023          Assessment & Plan:     RTC in 6 months for your annual exam Nicki Reaper, NP

## 2023-07-01 DIAGNOSIS — R0982 Postnasal drip: Secondary | ICD-10-CM | POA: Diagnosis not present

## 2023-07-01 DIAGNOSIS — J329 Chronic sinusitis, unspecified: Secondary | ICD-10-CM | POA: Diagnosis not present

## 2023-07-04 ENCOUNTER — Ambulatory Visit: Admitting: Urology

## 2023-07-10 ENCOUNTER — Other Ambulatory Visit: Payer: Self-pay | Admitting: Internal Medicine

## 2023-07-10 MED ORDER — AMPHETAMINE-DEXTROAMPHET ER 20 MG PO CP24
40.0000 mg | ORAL_CAPSULE | Freq: Every day | ORAL | 0 refills | Status: DC
Start: 2023-07-10 — End: 2023-08-14

## 2023-07-16 ENCOUNTER — Ambulatory Visit: Admitting: Urology

## 2023-07-16 VITALS — BP 130/81 | HR 101 | Ht 71.0 in | Wt 187.0 lb

## 2023-07-16 DIAGNOSIS — Z3009 Encounter for other general counseling and advice on contraception: Secondary | ICD-10-CM

## 2023-07-16 NOTE — Patient Instructions (Signed)
 Pre-Vasectomy Instructions ? ?STOP all aspirin or blood thinners (Aspirin, Plavix, Coumadin, Warfarin, Motrin, Ibuprofen, Advil, Aleve, Naproxen, Naprosyn) for 7 days prior to the procedure.  If you have any questions about stopping these medications please contact your primary care physician or cardiologist. ? ?Shave all hair from the upper scrotum on the day of the procedure.  This means just under the penis onto the scrotal sac.  The area shaved should measure about 2-3 inches around.  You may lather the scrotum with soap and water, and shave with a safety razor. ? ?After shaving the area, thoroughly wash the penis and the scrotum, then shower or bathe to remove all the loose hairs.  If needed, wash the area again just before coming in for your Vasectomy. ? ?It is recommended to have a light meal an hour or so prior to the procedure. ? ?Bring a scrotal support (jock strap or suspensory, or tight jockey shorts or underwear).  Wear comfortable pants or shorts. ? ?While the actual procedure usually takes about 45 minutes, you should be prepared to stay in the office for approximately one hour.  Bring someone with you to drive you home. ? ?If you have any questions or concerns, please feel free to call the office at [phone]. ?  ?

## 2023-07-16 NOTE — Progress Notes (Unsigned)
 07/16/2023 1:20 PM   Peter Kramer [birth year] [id number]  Referring provider: Carollynn Cirri, NP [address] [place],  [place] [address]  Chief Complaint  Patient presents with   VAS Consult    HPI: Peter Kramer is a 43 y.o. male who presents for vasectomy counseling.  He has 2 children Denies prior history urologic problems including chronic scrotal pain, epididymitis or orchitis No previous history inguinal hernia or pelvic surgery No history of bleeding or clotting disorders   PMH: Past Medical History:  Diagnosis Date   ADHD (attention deficit hyperactivity disorder)    Chicken pox    Essential hypertension 08/08/2016   Generalized anxiety disorder 07/17/2015   Genital herpes    Smoker 11/18/2014    Surgical History: Past Surgical History:  Procedure Laterality Date   MOUTH SURGERY      Home Medications:  Allergies as of 07/16/2023       Reactions   Bee Venom Swelling        Medication List        Accurate as of July 16, 2023  1:20 PM. If you have any questions, ask your nurse or doctor.          amphetamine -dextroamphetamine  20 MG 24 hr capsule Commonly known as: Adderall XR Take 2 capsules (40 mg total) by mouth daily.   atorvastatin  20 MG tablet Commonly known as: LIPITOR Take 1 tablet (20 mg total) by mouth daily.   buPROPion  150 MG 24 hr tablet Commonly known as: Wellbutrin  XL Take 1 tablet (150 mg total) by mouth daily.   citalopram  40 MG tablet Commonly known as: CELEXA  TAKE 1 TABLET BY MOUTH ONCE DAILY (SCHEDULE  AN  OFFICE  VISIT  BEFORE  ANYMORE  REFILLS)   lisinopril  20 MG tablet Commonly known as: ZESTRIL  Take 1 tablet (20 mg total) by mouth daily.   predniSONE  10 MG tablet Commonly known as: DELTASONE  Take 6 tabs on day 1, 5 tabs on day 2, 4 tabs on day 3, 3 tabs on day 4, 2 tabs on day 5, 1 tab on day 6   testosterone cypionate 200 MG/ML injection Commonly known as: DEPOTESTOSTERONE CYPIONATE SMARTSIG:0.6  Milliliter(s) SUB-Q Twice a Week   valACYclovir  500 MG tablet Commonly known as: Valtrex  Take 1 tablet (500 mg total) by mouth 2 (two) times daily as needed.        Allergies:  Allergies  Allergen Reactions   Bee Venom Swelling    Family History: Family History  Problem Relation Age of Onset   ADD / ADHD Father    ADD / ADHD Sister    ADD / ADHD Sister    Heart disease Maternal Grandmother    Cancer Paternal Grandfather        brain   Diabetes Neg Hx    Stroke Neg Hx    Colon cancer Neg Hx    Prostate cancer Neg Hx     Social History:  reports that he has quit smoking. His smoking use included cigarettes. He has a 14 pack-year smoking history. He has never used smokeless tobacco. He reports that he does not drink alcohol and does not use drugs.   Physical Exam: BP 130/81   Pulse (!) 101   Ht 5\' 11"  (1.803 m)   Wt 187 lb (84.8 kg)   BMI 26.08 kg/m   Constitutional:  Alert and oriented, No acute distress. HEENT: Walker AT Respiratory: Normal respiratory effort, no increased work of breathing. GU:  Phallus without lesions, testes descended bilaterally without masses or tenderness, spermatic cord/epididymis palpably normal bilaterally.  Vasa palpable bilaterally Psychiatric: Normal mood and affect.   Assessment & Plan:    1.  Undesired fertility Desires to schedule vasectomy We had a long discussion about vasectomy. We specifically discussed the procedure, recovery and the risks, benefits and alternatives of vasectomy. I explained that the procedure entails removal of a segment of each vas deferens, each of which conducts sperm, and that the purpose of this procedure is to cause sterility (inability to produce children or cause pregnancy). Vasectomy is intended to be permanent and irreversible form of contraception. Options for fertility after vasectomy include vasectomy reversal, or sperm retrieval with in vitro fertilization. These options are not always successful, and  they may be expensive. We discussed the importance of avoiding strenuous exercise for four days after vasectomy, and the importance of refraining from any form of ejaculation for seven days after vasectomy. I explained that vasectomy does not produce immediate sterility so another form of contraceptive must be used until sterility is assured by having semen checked for sperm. Thus, a post vasectomy semen analysis is necessary to confirm sterility. Rarely, vasectomy must be repeated. We discussed the approximately 1 in 2,000 risk of pregnancy after vasectomy for men who have post-vasectomy semen analysis showing absent sperm or rare non-motile sperm. Typical side effects include a small amount of oozing blood, some discomfort and mild swelling in the area of incision.  Vasectomy does not affect sexual performance, function, please, sensation, interest, desire, satisfaction, penile erection, volume of semen or ejaculation. Other rare risks include allergy or adverse reaction to an anesthetic, testicular atrophy, hematoma, infection/abscess, prolonged tenderness of the vas deferens, pain, swelling, painful nodule or scar (called sperm granuloma) or epididymtis. We discussed chronic testicular pain syndrome. This has been reported to occur in as many as 1-2% of men and may be permanent. This can be treated with medication, small procedures or (rarely) surgery. He indicated he would call back if he desires a preprocedure anxiolytic and would need a driver if utilizing   Geraline Knapp, MD  [facility] [address], [address] [place], [place] [address] [phone]

## 2023-07-17 ENCOUNTER — Encounter: Payer: Self-pay | Admitting: Urology

## 2023-07-17 DIAGNOSIS — J329 Chronic sinusitis, unspecified: Secondary | ICD-10-CM | POA: Diagnosis not present

## 2023-08-05 ENCOUNTER — Encounter: Payer: Self-pay | Admitting: Otolaryngology

## 2023-08-05 NOTE — Anesthesia Preprocedure Evaluation (Addendum)
 Anesthesia Evaluation  Patient identified by MRN, date of birth, ID band Patient awake    Reviewed: Allergy & Precautions, H&P , NPO status , Patient's Chart, lab work & pertinent test results  Airway Mallampati: III  TM Distance: >3 FB Neck ROM: Full    Dental no notable dental hx. (+) Missing Mostly edentulous, but does have one beginner implant post, left lower jaw:   Pulmonary neg pulmonary ROS, former smoker   Pulmonary exam normal breath sounds clear to auscultation       Cardiovascular hypertension, negative cardio ROS Normal cardiovascular exam Rhythm:Regular Rate:Normal     Neuro/Psych  PSYCHIATRIC DISORDERS Anxiety     negative neurological ROS  negative psych ROS   GI/Hepatic negative GI ROS, Neg liver ROS,GERD  ,,  Endo/Other  negative endocrine ROS    Renal/GU negative Renal ROS  negative genitourinary   Musculoskeletal negative musculoskeletal ROS (+)    Abdominal   Peds negative pediatric ROS (+)  Hematology negative hematology ROS (+)   Anesthesia Other Findings  Generalized anxiety disorder  Smoker Essential hypertension  ADHD (attention deficit hyperactivity disorder)    Reproductive/Obstetrics negative OB ROS                              Anesthesia Physical Anesthesia Plan  ASA: 2  Anesthesia Plan: General ETT   Post-op Pain Management:    Induction: Intravenous  PONV Risk Score and Plan:   Airway Management Planned: Oral ETT  Additional Equipment:   Intra-op Plan:   Post-operative Plan: Extubation in OR  Informed Consent: I have reviewed the patients History and Physical, chart, labs and discussed the procedure including the risks, benefits and alternatives for the proposed anesthesia with the patient or authorized representative who has indicated his/her understanding and acceptance.     Dental Advisory Given  Plan Discussed with:  Anesthesiologist, CRNA and Surgeon  Anesthesia Plan Comments: (Patient consented for risks of anesthesia including but not limited to:  - adverse reactions to medications - damage to eyes, teeth, lips or other oral mucosa - nerve damage due to positioning  - sore throat or hoarseness - Damage to heart, brain, nerves, lungs, other parts of body or loss of life  Patient voiced understanding and assent.)         Anesthesia Quick Evaluation

## 2023-08-11 ENCOUNTER — Encounter: Payer: Self-pay | Admitting: *Deleted

## 2023-08-11 NOTE — Discharge Instructions (Signed)

## 2023-08-12 ENCOUNTER — Other Ambulatory Visit: Payer: Self-pay | Admitting: Otolaryngology

## 2023-08-13 ENCOUNTER — Ambulatory Visit: Payer: Self-pay | Admitting: Anesthesiology

## 2023-08-13 ENCOUNTER — Ambulatory Visit
Admission: RE | Admit: 2023-08-13 | Discharge: 2023-08-13 | Disposition: A | Discharge status: Home / Self Care | Attending: Otolaryngology | Admitting: Otolaryngology

## 2023-08-13 ENCOUNTER — Encounter: Payer: Self-pay | Admitting: Otolaryngology

## 2023-08-13 ENCOUNTER — Other Ambulatory Visit: Payer: Self-pay

## 2023-08-13 ENCOUNTER — Encounter
Admission: RE | Disposition: A | Discharge status: Home / Self Care | Payer: Self-pay | Source: Home / Self Care | Attending: Otolaryngology

## 2023-08-13 ENCOUNTER — Encounter: Payer: Self-pay | Admitting: Internal Medicine

## 2023-08-13 DIAGNOSIS — J329 Chronic sinusitis, unspecified: Secondary | ICD-10-CM | POA: Diagnosis not present

## 2023-08-13 DIAGNOSIS — I10 Essential (primary) hypertension: Secondary | ICD-10-CM | POA: Diagnosis not present

## 2023-08-13 DIAGNOSIS — K219 Gastro-esophageal reflux disease without esophagitis: Secondary | ICD-10-CM | POA: Insufficient documentation

## 2023-08-13 DIAGNOSIS — F419 Anxiety disorder, unspecified: Secondary | ICD-10-CM | POA: Insufficient documentation

## 2023-08-13 DIAGNOSIS — J324 Chronic pansinusitis: Secondary | ICD-10-CM | POA: Diagnosis not present

## 2023-08-13 DIAGNOSIS — Z87891 Personal history of nicotine dependence: Secondary | ICD-10-CM | POA: Insufficient documentation

## 2023-08-13 DIAGNOSIS — J328 Other chronic sinusitis: Secondary | ICD-10-CM | POA: Diagnosis present

## 2023-08-13 HISTORY — PX: MAXILLARY ANTROSTOMY: SHX2003

## 2023-08-13 HISTORY — PX: ETHMOIDECTOMY: SHX5197

## 2023-08-13 HISTORY — PX: IMAGE GUIDED SINUS SURGERY: SHX6570

## 2023-08-13 HISTORY — PX: FRONTAL SINUS EXPLORATION: SHX6591

## 2023-08-13 SURGERY — SINUS SURGERY, WITH IMAGING GUIDANCE
Anesthesia: General | Site: Nose | Laterality: Right

## 2023-08-13 MED ORDER — PROPOFOL 10 MG/ML IV BOLUS
INTRAVENOUS | Status: AC
  Filled 2023-08-13: qty 20

## 2023-08-13 MED ORDER — ONDANSETRON HCL 4 MG/2ML IJ SOLN
INTRAMUSCULAR | Status: AC
  Filled 2023-08-13: qty 2

## 2023-08-13 MED ORDER — FENTANYL CITRATE PF 50 MCG/ML IJ SOSY
50.0000 ug | PREFILLED_SYRINGE | INTRAMUSCULAR | Status: AC | PRN
  Administered 2023-08-13 (×2): 50 ug via INTRAVENOUS

## 2023-08-13 MED ORDER — LIDOCAINE-EPINEPHRINE 1 %-1:100000 IJ SOLN
INTRAMUSCULAR | Status: DC | PRN
  Administered 2023-08-13: 7 mL

## 2023-08-13 MED ORDER — PREDNISONE 10 MG (21) PO TBPK: ORAL_TABLET | ORAL | 0 refills | Status: DC

## 2023-08-13 MED ORDER — FENTANYL CITRATE (PF) 100 MCG/2ML IJ SOLN
INTRAMUSCULAR | Status: DC | PRN
Start: 2023-08-13 — End: 2023-08-13
  Administered 2023-08-13: 100 ug via INTRAVENOUS

## 2023-08-13 MED ORDER — ACETAMINOPHEN 10 MG/ML IV SOLN
1000.0000 mg | Freq: Once | INTRAVENOUS | Status: AC
  Administered 2023-08-13: 1000 mg via INTRAVENOUS

## 2023-08-13 MED ORDER — ACETAMINOPHEN 10 MG/ML IV SOLN
INTRAVENOUS | Status: AC
Start: 2023-08-13 — End: ?
  Filled 2023-08-13: qty 100

## 2023-08-13 MED ORDER — SUGAMMADEX SODIUM 200 MG/2ML IV SOLN
INTRAVENOUS | Status: AC
  Filled 2023-08-13: qty 2

## 2023-08-13 MED ORDER — SUGAMMADEX SODIUM 200 MG/2ML IV SOLN
INTRAVENOUS | Status: DC | PRN
  Administered 2023-08-13: 200 mg via INTRAVENOUS

## 2023-08-13 MED ORDER — DEXMEDETOMIDINE HCL IN NACL 80 MCG/20ML IV SOLN
INTRAVENOUS | Status: DC | PRN
  Administered 2023-08-13: 8 ug via INTRAVENOUS
  Administered 2023-08-13: 4 ug via INTRAVENOUS

## 2023-08-13 MED ORDER — DEXAMETHASONE SODIUM PHOSPHATE 4 MG/ML IJ SOLN
INTRAMUSCULAR | Status: AC
  Filled 2023-08-13: qty 1

## 2023-08-13 MED ORDER — ROCURONIUM BROMIDE 100 MG/10ML IV SOLN
INTRAVENOUS | Status: DC | PRN
  Administered 2023-08-13: 30 mg via INTRAVENOUS
  Administered 2023-08-13: 10 mg via INTRAVENOUS

## 2023-08-13 MED ORDER — MIDAZOLAM HCL 5 MG/5ML IJ SOLN
INTRAMUSCULAR | Status: DC | PRN
  Administered 2023-08-13: 2 mg via INTRAVENOUS

## 2023-08-13 MED ORDER — FENTANYL CITRATE (PF) 100 MCG/2ML IJ SOLN
INTRAMUSCULAR | Status: AC
  Filled 2023-08-13: qty 2

## 2023-08-13 MED ORDER — ONDANSETRON HCL 4 MG PO TABS: 4.0000 mg | ORAL_TABLET | Freq: Three times a day (TID) | ORAL | 0 refills | Status: DC | PRN

## 2023-08-13 MED ORDER — SUGAMMADEX SODIUM 200 MG/2ML IV SOLN
INTRAVENOUS | Status: AC
Start: 2023-08-13 — End: ?
  Filled 2023-08-13: qty 2

## 2023-08-13 MED ORDER — OXYCODONE-ACETAMINOPHEN 5-325 MG PO TABS: 1.0000 | ORAL_TABLET | ORAL | 0 refills | Status: DC | PRN

## 2023-08-13 MED ORDER — AMOXICILLIN-POT CLAVULANATE 875-125 MG PO TABS: 1.0000 | ORAL_TABLET | Freq: Two times a day (BID) | ORAL | 0 refills | Status: AC

## 2023-08-13 MED ORDER — LIDOCAINE HCL (CARDIAC) PF 100 MG/5ML IV SOSY
PREFILLED_SYRINGE | INTRAVENOUS | Status: DC | PRN
  Administered 2023-08-13: 100 mg via INTRAVENOUS

## 2023-08-13 MED ORDER — OXYMETAZOLINE HCL 0.05 % NA SOLN
NASAL | Status: DC | PRN
  Administered 2023-08-13: 1

## 2023-08-13 MED ORDER — SODIUM CHLORIDE 0.9 % IV SOLN: INTRAVENOUS | Status: DC

## 2023-08-13 MED ORDER — SUCCINYLCHOLINE CHLORIDE 200 MG/10ML IV SOSY
PREFILLED_SYRINGE | INTRAVENOUS | Status: AC
Start: 2023-08-13 — End: ?
  Filled 2023-08-13: qty 10

## 2023-08-13 MED ORDER — LACTATED RINGERS IV SOLN: INTRAVENOUS | Status: DC

## 2023-08-13 MED ORDER — DEXAMETHASONE SODIUM PHOSPHATE 4 MG/ML IJ SOLN
INTRAMUSCULAR | Status: DC | PRN
  Administered 2023-08-13: 4 mg via INTRAVENOUS

## 2023-08-13 MED ORDER — OXYCODONE HCL 5 MG PO TABS
10.0000 mg | ORAL_TABLET | Freq: Once | ORAL | Status: AC
  Administered 2023-08-13: 10 mg via ORAL

## 2023-08-13 MED ORDER — MIDAZOLAM HCL 2 MG/2ML IJ SOLN
INTRAMUSCULAR | Status: AC
  Filled 2023-08-13: qty 2

## 2023-08-13 MED ORDER — PROPOFOL 10 MG/ML IV BOLUS
INTRAVENOUS | Status: DC | PRN
  Administered 2023-08-13: 200 mg via INTRAVENOUS

## 2023-08-13 MED ORDER — SUCCINYLCHOLINE CHLORIDE 200 MG/10ML IV SOSY
PREFILLED_SYRINGE | INTRAVENOUS | Status: DC | PRN
Start: 2023-08-13 — End: 2023-08-13
  Administered 2023-08-13: 140 mg via INTRAVENOUS

## 2023-08-13 MED ORDER — OXYCODONE HCL 5 MG PO TABS
ORAL_TABLET | ORAL | Status: AC
  Filled 2023-08-13: qty 2

## 2023-08-13 MED ORDER — ROCURONIUM BROMIDE 10 MG/ML (PF) SYRINGE
PREFILLED_SYRINGE | INTRAVENOUS | Status: AC
Start: 2023-08-13 — End: ?
  Filled 2023-08-13: qty 10

## 2023-08-13 MED ORDER — ONDANSETRON HCL 4 MG/2ML IJ SOLN
INTRAMUSCULAR | Status: DC | PRN
  Administered 2023-08-13: 4 mg via INTRAVENOUS

## 2023-08-13 SURGICAL SUPPLY — 25 items
BLADE SHAVER TRUDI 4 15 DEG (ENT DISPOSABLE) IMPLANT
CABLE TRUDI DISPOSABLE (ENT DISPOSABLE) ×4 IMPLANT
CANISTER SUCT 1200ML W/VALVE (MISCELLANEOUS) ×2 IMPLANT
COAGULATOR SUCTION 6 10FR HC (MISCELLANEOUS) ×2 IMPLANT
DEVICE INFLATION SEID (MISCELLANEOUS) IMPLANT
DRESSING NASL FOAM PST OP SINU (MISCELLANEOUS) IMPLANT
ELECTRODE REM PT RTRN 9FT ADLT (ELECTROSURGICAL) ×2 IMPLANT
GAUZE SPONGE 4X4 12PLY STRL (GAUZE/BANDAGES/DRESSINGS) IMPLANT
GLOVE SURG GAMMEX PI TX LF 7.5 (GLOVE) ×4 IMPLANT
IV NS 250ML BAXH (IV SOLUTION) ×2 IMPLANT
KIT TURNOVER KIT A (KITS) ×2 IMPLANT
NS IRRIG 500ML POUR BTL (IV SOLUTION) ×2 IMPLANT
PACK ENT CUSTOM (PACKS) ×2 IMPLANT
PACKING NASAL EPIS 4X2.4 XEROG (MISCELLANEOUS) IMPLANT
PATTIES SURGICAL .5 X3 (DISPOSABLE) ×2 IMPLANT
SOLUTION ANTFG W/FOAM PAD STRL (MISCELLANEOUS) ×2 IMPLANT
STRAP BODY AND KNEE 60X3 (MISCELLANEOUS) ×2 IMPLANT
SYR 10ML LL (SYRINGE) ×2 IMPLANT
SYR 20ML LL LF (SYRINGE) IMPLANT
SYR EAR/ULCER 2OZ (SYRINGE) ×2 IMPLANT
SYSTEM BALLOON SINUPLASTY 6X16 (SINUPLASTY) IMPLANT
TRACKER DISPOSABLE PAITIENT (MISCELLANEOUS) ×2 IMPLANT
TUBING CONNECTING 10 (TUBING) ×2 IMPLANT
TUBING IRRIGATION BIEN-AIR (TUBING) ×2 IMPLANT
WATER STERILE IRR 500ML POUR (IV SOLUTION) IMPLANT

## 2023-08-13 NOTE — Op Note (Signed)
 ..08/13/2023  10:37 AM    Peter Kramer  [id number]   Pre-Op Dx:  Chronic Rhinosinusitis refractory to medical treatment  Post-op Dx: Same  Proc:   1)  Image Guided Sinus Surgery,  2)  Right Frontal Sinusotomy  3)  Right total ethmoidectomy  4)  Left Anterior ethmoidectomy  5)  Bilateral Sphenoidotomy  6)  Right Maxillary Antrostomy with Tissue Removal  7)  Left Maxillary Antrostomy  Surg:  Peter Kramer  Anes:  General  EBL:  25ml  Comp:  None  Findings: Significant inflammation on right with purulence filling right maxillary sinus and inflamed polypoid edema of right frontal and ethmoid sinus.  Moderate edema and osteitic bone on left maxillary and ethmoid sinus.  All diseased sinuses successfully opened.  Procedure: After the patient was identified in holding and the benefits of the procedure were reviewed as well as the consent and risks.  The patient was taken to the operating room and with the patient in a comfortable supine position,  general orotracheal anesthesia was induced without difficulty.  A proper time-out was performed.  The Acclarent TRUDI image guidance system was set up and calibrated in the normal fashion.     The patient next received preoperative Afrin spray for topical decongestion and vasoconstriction and 1% Xylocaine with 1:100,000 epinephrine, 7 cc's, was infiltrated into the inferior turbinates, septum, and anterior middle turbinates bilaterally.  Several minutes were allowed for this to take effect.  Cottoniod pledgets soaked in Afrin were placed into both nasal cavities and left while the patient was prepped and draped in the standard fashion.  The materials were removed from the nose and observed to be intact and correct in number.  The nose was next inspected with a zero degree endoscope and the middle turbinates were medialized and afrin soaked pledgets were placed lateral to the turbinates for approximately one minute.  At this time,  attention was directed to the patient's RIGHT frontal sinus.  The uncinate process was infractured with a maxillary ostia seeker.  The Acclarent balloon sinuplasty device was next placed behind the uncinate process and the guide wire was threaded into the frontal sinus with proper transillumination.  The sinus tract was then dilated 3X for a patent frontal sinus.  At this time a currette and Giraffe probe was used to remove the fractured ager nasi cells for a widely patient frontal sinus outflow tract.    This was evaluated with fragements of residual ager nasi cells were removed with 45 degree up-biting forceps.  At this time attention was directed to the patient's maxillary sinuses.  On the left, a ball tipped probe was placed through the natural ostia and this was used to create a larger opening.  Using a microdebrider, the maxillary antrostomy was enlarged for a widely patent maxillary antrostomy.  This was repeated in a simlar fashion on the patient's right side.  Hemostasis was performed with topical Afrin soaked pledgets.  Visualization with a zero degree endoscope was used to examine the bilateral maxillary antrostomies which were noted to be widely patent and in continuity with the natural os bilaterally.  Next attention was directed to the patient's ethmoid sinuses.  The RIGHT ethmoid bulla was entered with a straight image guidance suction.  The ethmoid cells were opened from a medial and inferior position superior and laterally until the vertical lamella was encountered.  This was opened and the posterior ethmoid was opened in a similar fashion.  All fragments of bone and mucosa  were removed with straight biting forceps.  The skull base was identified and trauma was avoided.  Image guidance was used throughout to ensure all diseased air cells were opened.    Next attention was directed to the patient's ethmoid sinuses.  The LEFT ethmoid bulla was entered with a straight image guidance suction.  The  ethmoid cells were opened from a medial and inferior position superior and laterally until the vertical lamella was encountered.  All fragments of bone and mucosa were removed with straight biting forceps.  The skull base was identified and trauma was avoided.  Image guidance was used throughout to ensure all diseased air cells were opened.   Attention at this time was directed to the patient's sphenoid sinuses.  On the patient's left side, the middle turbinate was lateralized and the superior turbinate was identified.  The sphenoid os was visualized and the op was sequentially dilated with an acclarent balloon device and then elarged with straight suction.  This was repeated on the patient's opposite side again with creation of a large sphenoid ostia bilaterally.  At this time with all diseased sinuses opened, the patient's nasal cavity was examinated and copiously irrigated with sterile saline.  Meticulous hemostasis was continued and all sinuses were examined and noted to be widely patent.    Stamberger sinufoam was placed in the ethmoid cavity bilaterally and around the cut edge of the maxillary antrostomy bilaterally.  Xerogel was cut and fashioned and placed lateral to the middle turbinates bilaterally.  This was inflated with sterile saline.  Stamberger was next placed anterior to Xerogel.  Hemostasis was continued.  Care of the patient at this time was transferred to anesthesia and was extubated and taken to PACU in good condition.   Dispo:   PACU to home  Plan: Ice, elevation, narcotic analgesia and prophylactic antibiotics f  We will reevaluate the patient in the office in 7 days.  Return to work in 7-10 days, strenuous activities in two weeks.   Peter Kramer 08/13/2023 10:37 AM

## 2023-08-13 NOTE — Anesthesia Postprocedure Evaluation (Signed)
 Anesthesia Post Note  Patient: Peter Kramer  Procedure(s) Performed: SINUS SURGERY, WITH IMAGING GUIDANCE (Bilateral: Nose) MAXILLARY ANTROSTOMY (Bilateral: Nose) ETHMOIDECTOMY (Bilateral: Nose) EXPLORATION, FRONTAL SINUS (Right: Nose)  Patient location during evaluation: PACU Anesthesia Type: General Level of consciousness: awake and alert Pain management: pain level controlled Vital Signs Assessment: post-procedure vital signs reviewed and stable Respiratory status: spontaneous breathing, nonlabored ventilation, respiratory function stable and patient connected to nasal cannula oxygen Cardiovascular status: blood pressure returned to baseline and stable Postop Assessment: no apparent nausea or vomiting Anesthetic complications: no   No notable events documented.   Last Vitals:  Vitals:   08/13/23 1112 08/13/23 1129  BP:  (!) 143/77  Pulse: 81 83  Resp: 15 20  Temp: 36.7 C (!) 36.4 C  SpO2: 90% 95%    Last Pain:  Vitals:   08/13/23 1129  TempSrc:   PainSc: 0-No pain                 Dreya Buhrman C Alaija Ruble

## 2023-08-13 NOTE — Anesthesia Procedure Notes (Signed)
 Procedure Name: Intubation Date/Time: 08/13/2023 9:11 AM  Performed by: Sherrlyn Dolores, CRNAPre-anesthesia Checklist: Patient identified, Patient being monitored, Timeout performed, Emergency Drugs available and Suction available Patient Re-evaluated:Patient Re-evaluated prior to induction Oxygen Delivery Method: Circle system utilized Preoxygenation: Pre-oxygenation with 100% oxygen Induction Type: IV induction Ventilation: Mask ventilation without difficulty Laryngoscope Size: McGrath and 4 Grade View: Grade I Tube type: Oral Tube size: 7.5 mm Number of attempts: 1 Airway Equipment and Method: Stylet Placement Confirmation: ETT inserted through vocal cords under direct vision, positive ETCO2 and breath sounds checked- equal and bilateral Secured at: 21 cm Tube secured with: Tape Dental Injury: Teeth and Oropharynx as per pre-operative assessment  Comments: DL x1 with McGrath MAC 4 blade, grade 1 view. Atraumatic intubation. Dentition unchanged from preop baseline (poor dentition/missing/edentulous).

## 2023-08-13 NOTE — Transfer of Care (Signed)
 Immediate Anesthesia Transfer of Care Note  Patient: Peter Kramer  Procedure(s) Performed: SINUS SURGERY, WITH IMAGING GUIDANCE (Bilateral: Nose) MAXILLARY ANTROSTOMY (Bilateral: Nose) ETHMOIDECTOMY (Bilateral: Nose) EXPLORATION, FRONTAL SINUS (Right: Nose)  Patient Location: PACU  Anesthesia Type: General ETT  Level of Consciousness: awake, alert  and patient cooperative  Airway and Oxygen Therapy: Patient Spontanous Breathing and Patient connected to supplemental oxygen  Post-op Assessment: Post-op Vital signs reviewed, Patient's Cardiovascular Status Stable, Respiratory Function Stable, Patent Airway and No signs of Nausea or vomiting  Post-op Vital Signs: Reviewed and stable  Complications: No notable events documented.

## 2023-08-14 ENCOUNTER — Encounter: Payer: Self-pay | Admitting: Otolaryngology

## 2023-08-14 MED ORDER — AMPHETAMINE-DEXTROAMPHET ER 20 MG PO CP24: 40.0000 mg | ORAL_CAPSULE | Freq: Every day | ORAL | 0 refills | Status: DC

## 2023-08-15 ENCOUNTER — Encounter: Admitting: Urology

## 2023-08-15 LAB — SURGICAL PATHOLOGY

## 2023-08-19 DIAGNOSIS — J3489 Other specified disorders of nose and nasal sinuses: Secondary | ICD-10-CM | POA: Diagnosis not present

## 2023-09-12 ENCOUNTER — Encounter: Payer: Self-pay | Admitting: Internal Medicine

## 2023-09-15 MED ORDER — AMPHETAMINE-DEXTROAMPHET ER 20 MG PO CP24: 40.0000 mg | ORAL_CAPSULE | Freq: Every day | ORAL | 0 refills | Status: DC

## 2023-09-15 NOTE — Addendum Note (Signed)
 Addended by: ANTONETTE ANGELINE ORN on: 09/15/2023 11:12 AM   Modules accepted: Orders

## 2023-10-05 ENCOUNTER — Encounter: Payer: Self-pay | Admitting: Internal Medicine

## 2023-10-06 MED ORDER — AMPHETAMINE-DEXTROAMPHET ER 20 MG PO CP24: 40.0000 mg | ORAL_CAPSULE | Freq: Every day | ORAL | 0 refills | Status: DC

## 2023-10-16 ENCOUNTER — Telehealth (INDEPENDENT_AMBULATORY_CARE_PROVIDER_SITE_OTHER): Admitting: Internal Medicine

## 2023-10-16 ENCOUNTER — Encounter: Payer: Self-pay | Admitting: Internal Medicine

## 2023-10-16 DIAGNOSIS — F900 Attention-deficit hyperactivity disorder, predominantly inattentive type: Secondary | ICD-10-CM

## 2023-10-16 MED ORDER — LISDEXAMFETAMINE DIMESYLATE 40 MG PO CAPS
40.0000 mg | ORAL_CAPSULE | ORAL | 0 refills | Status: DC
Start: 2023-10-16 — End: 2023-11-12

## 2023-10-16 NOTE — Patient Instructions (Signed)
 Supporting Someone With Attention Deficit Hyperactivity Disorder Attention deficit hyperactivity disorder (ADHD) is a mental health disorder that starts during childhood. For most people with ADHD, the condition continues into the adult years. Treatment can help you manage your symptoms. ADHD is a common cause of behavioral and learning problems among children. ADHD is a long-term (chronic) condition. If this disorder is not treated, it can have serious effects into adolescence and adulthood. Managing lifestyle changes can be challenging. Seeking support from family and friends can be helpful. How does this condition affect my loved one? ADHD can affect daily functioning in ways that often cause problems for the person with ADHD and their friends and family members. A child with ADHD may: Have a poor attention span. This means that the child can only stay focused or interested in something for a short time and may get distracted easily. Have trouble listening to instructions. Have problems with organization. Often make simple mistakes. Forget things and lose things often. Talk out of turn, or interrupt others. Move constantly, fidget, and have difficulty sitting still. An adult with ADHD may: Get distracted easily. Be disorganized at home and work. Miss, forget, or be late for appointments. Have trouble with details and completing tasks. Be irritable and impatient. Get bored easily during meetings. Have great difficulty concentrating. What do I need to know about the treatment options? Treatment for this condition usually involves: Behavioral treatment. Working with a Paramedic or mental health care provider, the person with ADHD may: Set rewards for desired behavior. Set small goals and clear expectations, and be held accountable for meeting them. Get help with planning and timing activities. Become more patient and more mindful of the condition. Medicines, such as: Stimulant or  non-stimulant medicines that help a person to: Control their behavior (decrease impulsivity). Control their extra physical activity (decrease hyperactivity). Increase the ability to pay attention. Antidepressants for depression or anxiety Structured classroom management for children at school through structured activities during and after school, support from teachers, and special education specialists. Teaching parents how to use techniques at home to help manage their child's symptoms and behavior. These include rewarding good behavior, providing regular schedules, and setting limits. What actions can I take to manage the condition? Talk about the condition Pick a time to talk with your loved one when distractions and interruptions are unlikely. Let your loved one know that they are capable of success. Focus on your loved one's strengths, and try not to let your loved one use ADHD as an excuse for undesirable behavior. Let your loved one know that there are well-known, successful people who also have ADHD. This may be encouraging to your loved one. Give your loved one time to process their thoughts and to ask questions. Children with ADHD may benefit from hearing more about how their treatment plan will help them. This may help them focus on goal behaviors. Find support and resources A health care provider may be able to recommend resources that are available online or over the phone. You could start with: Attention Deficit Disorder Association (ADDA): [employer] of Mental Health [facility]): [employer].com Training classes or conferences that help parents of children with ADHD to support their children and cope with the disorder. Support groups for families who are affected by ADHD. General support If you are a parent of a child with ADHD, you can take the following actions to support your child's education: Talk to teachers about the ways that your child learns best. Be your  child's advocate and stay in touch with their school about all problems related to ADHD. At the end of the summer, make appointments to talk with teachers and other school staff before the new school year begins. Listen to teachers carefully, and share your child's history with them. Create a behavior plan that your child, your family, and the teachers can agree on. Write down goals to help your child succeed. What are some signs that the condition is getting worse? Signs that your loved one's condition may be getting worse include: Increased trouble completing tasks and paying attention. Hyperactivity and impulsivity. Problems with relationships. Impatience, restlessness, and mood swings. Worsening problems at work or school. How to manage stress It is important to find ways to care for your body, mind, and well-being while supporting someone with ADHD. Spend time with friends and family. Find someone you can talk to who will also help you work on using coping skills to manage stress. Understand what your limits are. Say "no" to requests or events that lead to a schedule that is too busy. Make time for activities that help you relax, and try not to feel guilty about taking time for yourself. Consider trying meditation and deep breathing exercises to lower your stress. Get plenty of sleep. Exercise, even if it is just taking a short walk a few times a week. If you are a parent of a child with ADHD, arrange for child care so you can take breaks once in a while. Contact a health care provider if: Your loved one's symptoms get worse. Your loved one shows signs of depression, anxiety, or another mental health condition. Your child has behavioral problems at school. Get help right away if: Get help right away if you feel like your loved one may hurt themselves or others, or if they have thoughts about taking their own life. Go to your nearest emergency room or: Call 911. Call the National  Suicide Prevention Lifeline at 469 292 5149 or 988. This is open 24 hours a day. Text the Crisis Text Line at 774-793-9308. Summary Attention deficit hyperactivity disorder (ADHD) is a long-term (chronic) condition that can affect daily functioning in ways that often cause problems for the person with ADHD and their loved ones. This disorder can be treated effectively with medicine, behavioral treatment, and techniques to manage symptoms and behaviors. Many organizations and groups are available to help families to manage ADHD. It is important to find ways to care for your own body, mind, and well-being while supporting someone with ADHD. Make time for activities that help you relax. This information is not intended to replace advice given to you by your health care provider. Make sure you discuss any questions you have with your health care provider. Document Revised: 06/29/2021 Document Reviewed: 06/29/2021 Elsevier Patient Education  2024 [employer].

## 2023-10-16 NOTE — Progress Notes (Signed)
 Virtual Visit via Video Note  I connected with Peter Kramer on 10/16/23 at  2:40 PM EDT by a video enabled telemedicine application and verified that I am speaking with the correct person using two identifiers.  Location: Patient: In his car Provider: Office  Persons participating in this video call: Angeline Laura, NP and Peter Kramer   I discussed the limitations of evaluation and management by telemedicine and the availability of in person appointments. The patient expressed understanding and agreed to proceed.  History of Present Illness:  Discussed the use of AI scribe software for clinical note transcription with the patient, who gave verbal consent to proceed.   Peter Kramer is a 43 year old male who presents with issues related to his Adderall medication.  He is currently taking Adderall at a dose of 20 mg twice a day, once in the morning and once at lunch, totaling 40 mg daily. He does not feel like it is as effective as it used to be. He still has issues with motivation, inattention and difficulty completing tasks. He reports he has been on Vyvanse  in the past and would like to get restarted on this.       Past Medical History:  Diagnosis Date   ADHD (attention deficit hyperactivity disorder)    Chicken pox    Essential hypertension 08/08/2016   Generalized anxiety disorder 07/17/2015   Genital herpes    Smoker 11/18/2014    Current Outpatient Medications  Medication Sig Dispense Refill   amphetamine -dextroamphetamine  (ADDERALL XR) 20 MG 24 hr capsule Take 2 capsules (40 mg total) by mouth daily. 20 capsule 0   atorvastatin  (LIPITOR) 20 MG tablet Take 1 tablet (20 mg total) by mouth daily. 90 tablet 1   buPROPion  (WELLBUTRIN  XL) 150 MG 24 hr tablet Take 1 tablet (150 mg total) by mouth daily. 90 tablet 1   citalopram  (CELEXA ) 40 MG tablet TAKE 1 TABLET BY MOUTH ONCE DAILY (SCHEDULE  AN  OFFICE  VISIT  BEFORE  ANYMORE  REFILLS) 90 tablet 1   lisinopril  (ZESTRIL ) 20  MG tablet Take 1 tablet (20 mg total) by mouth daily. 90 tablet 1   ondansetron  (ZOFRAN ) 4 MG tablet Take 1 tablet (4 mg total) by mouth every 8 (eight) hours as needed for nausea or vomiting. 20 tablet 0   oxyCODONE -acetaminophen  (PERCOCET) 5-325 MG tablet Take 1 tablet by mouth every 4 (four) hours as needed for severe pain (pain score 7-10). 20 tablet 0   predniSONE  (STERAPRED UNI-PAK 21 TAB) 10 MG (21) TBPK tablet Sterapred DS 6 day taper 21 tablet 0   testosterone cypionate (DEPOTESTOSTERONE CYPIONATE) 200 MG/ML injection SMARTSIG:0.6 Milliliter(s) SUB-Q Twice a Week     valACYclovir  (VALTREX ) 500 MG tablet Take 1 tablet (500 mg total) by mouth 2 (two) times daily as needed. 30 tablet 0   No current facility-administered medications for this visit.    Allergies  Allergen Reactions   Bee Venom Swelling    Family History  Problem Relation Age of Onset   ADD / ADHD Father    ADD / ADHD Sister    ADD / ADHD Sister    Heart disease Maternal Grandmother    Cancer Paternal Grandfather        brain   Diabetes Neg Hx    Stroke Neg Hx    Colon cancer Neg Hx    Prostate cancer Neg Hx     Social History   Socioeconomic History   Marital status: Married  Spouse name: emily   Number of children: 2   Years of education: Not on file   Highest education level: GED or equivalent  Occupational History   Occupation: [employer]    Comment: full time  Tobacco Use   Smoking status: Former    Current packs/day: 1.00    Average packs/day: 1 pack/day for 14.0 years (14.0 ttl pk-yrs)    Types: Cigarettes   Smokeless tobacco: Never   Tobacco comments:    patient relapsed  Vaping Use   Vaping status: Never Used  Substance and Sexual Activity   Alcohol use: No    Alcohol/week: 0.0 standard drinks of alcohol    Comment: occasional   Drug use: No   Sexual activity: Yes    Birth control/protection: None  Other Topics Concern   Not on file  Social History Narrative   Not on file    Social Drivers of Health   Financial Resource Strain: High Risk (01/27/2017)   Overall Financial Resource Strain (CARDIA)    Difficulty of Paying Living Expenses: Very hard  Food Insecurity: Food Insecurity Present (01/27/2017)   Hunger Vital Sign    Worried About Running Out of Food in the Last Year: Sometimes true    Ran Out of Food in the Last Year: Sometimes true  Transportation Needs: No Transportation Needs (01/27/2017)   PRAPARE - Administrator, Civil Service (Medical): No    Lack of Transportation (Non-Medical): No  Physical Activity: Sufficiently Active (01/27/2017)   Exercise Vital Sign    Days of Exercise per Week: 3 days    Minutes of Exercise per Session: 120 min  Stress: Stress Concern Present (01/27/2017)   [employer] of Occupational Health - Occupational Stress Questionnaire    Feeling of Stress : To some extent  Social Connections: Somewhat Isolated (01/27/2017)   Social Connection and Isolation Panel    Frequency of Communication with Friends and Family: More than three times a week    Frequency of Social Gatherings with Friends and Family: Once a week    Attends Religious Services: Never    Database administrator or Organizations: No    Attends Banker Meetings: Never    Marital Status: Married  Catering manager Violence: Not At Risk (01/27/2017)   Humiliation, Afraid, Rape, and Kick questionnaire    Fear of Current or Ex-Partner: No    Emotionally Abused: No    Physically Abused: No    Sexually Abused: No     Constitutional: Denies fever, malaise, fatigue, headache or abrupt weight changes.  HEENT:  Denies eye pain, eye redness, ringing in the ears, wax buildup, runny nose, nasal congestion, ear pain, bloody nose or sore throat. Respiratory: Denies difficulty breathing, shortness of breath, cough or sputum production.   Cardiovascular: Denies chest pain, chest tightness, palpitations or swelling in the hands or feet.   Gastrointestinal: Denies abdominal pain, bloating, constipation, diarrhea or blood in the stool.  GU: Denies urgency, frequency, pain with urination, burning sensation, blood in urine, odor or discharge. Musculoskeletal: Denies decrease in range of motion, difficulty with gait, muscle pain or joint pain and swelling.  Skin:  Denies redness, rashes, lesions or ulcercations.  Neurological: Patient reports inattention.  Denies dizziness, difficulty with memory, difficulty with speech or problems with balance and coordination.  Psych: Patient has a history of anxiety and depression.  Denies SI/HI.  No other specific complaints in a complete review of systems (except as listed in HPI above).  Observations/Objective:  Wt Readings from Last 3 Encounters:  08/13/23 189 lb 4.8 oz (85.9 kg)  07/16/23 187 lb (84.8 kg)  06/09/23 191 lb 9.6 oz (86.9 kg)    General: Appears his stated age, well developed, well nourished in NAD. Pulmonary/Chest: Normal effort. No respiratory distress.  Neurological: Alert and oriented.  Coordination normal.  Psychiatric: Mood and affect normal. Behavior is normal. Judgment and thought content normal.     BMET    Component Value Date/Time   NA 138 06/09/2023 1648   K 4.2 06/09/2023 1648   CL 99 06/09/2023 1648   CO2 27 06/09/2023 1648   GLUCOSE 87 06/09/2023 1648   BUN 14 06/09/2023 1648   CREATININE 0.96 06/09/2023 1648   CALCIUM  9.8 06/09/2023 1648   GFRNONAA >60 01/08/2017 1239   GFRAA >60 01/08/2017 1239    Lipid Panel     Component Value Date/Time   CHOL 244 (H) 06/09/2023 1648   TRIG 142 06/09/2023 1648   HDL 45 06/09/2023 1648   CHOLHDL 5.4 (H) 06/09/2023 1648   VLDL 20.0 10/05/2019 1334   LDLCALC 171 (H) 06/09/2023 1648    CBC    Component Value Date/Time   WBC 9.1 06/09/2023 1648   RBC 5.64 06/09/2023 1648   HGB 16.4 06/09/2023 1648   HCT 49.6 06/09/2023 1648   PLT 326 06/09/2023 1648   MCV 87.9 06/09/2023 1648   MCH 29.1  06/09/2023 1648   MCHC 33.1 06/09/2023 1648   RDW 13.0 06/09/2023 1648    Hgb A1C Lab Results  Component Value Date   HGBA1C 5.6 06/09/2023        Assessment and Plan:  Assessment and Plan    Attention-Deficit/Hyperactivity Disorder (ADHD) Adderall 20 mg twice daily deemed ineffective. Considering Vyvanse  40 mg once daily as an alternative. Prior authorization required due to insurance formulary restrictions. - Prescribe Vyvanse  40 mg once daily. - Submit prior authorization for Vyvanse . - Advise him to message via MyChart for insurance issues before the weekend.      RTC in 2 months for your annual exam  Follow Up Instructions:    I discussed the assessment and treatment plan with the patient. The patient was provided an opportunity to ask questions and all were answered. The patient agreed with the plan and demonstrated an understanding of the instructions.   The patient was advised to call back or seek an in-person evaluation if the symptoms worsen or if the condition fails to improve as anticipated.   Angeline Laura, NP

## 2023-10-22 ENCOUNTER — Encounter: Payer: Self-pay | Admitting: Internal Medicine

## 2023-10-23 MED ORDER — LISDEXAMFETAMINE DIMESYLATE 30 MG PO CAPS: 30.0000 mg | ORAL_CAPSULE | Freq: Every day | ORAL | 0 refills | Status: DC

## 2023-11-03 ENCOUNTER — Other Ambulatory Visit: Payer: Self-pay | Admitting: Internal Medicine

## 2023-11-04 ENCOUNTER — Encounter: Payer: Self-pay | Admitting: Internal Medicine

## 2023-11-06 ENCOUNTER — Other Ambulatory Visit: Payer: Self-pay

## 2023-11-06 ENCOUNTER — Encounter: Payer: Self-pay | Admitting: Internal Medicine

## 2023-11-06 MED ORDER — BUPROPION HCL ER (XL) 150 MG PO TB24: 150.0000 mg | ORAL_TABLET | Freq: Every day | ORAL | 1 refills | Status: AC

## 2023-11-06 MED ORDER — LISINOPRIL 20 MG PO TABS: 20.0000 mg | ORAL_TABLET | Freq: Every day | ORAL | 1 refills | Status: AC

## 2023-11-06 NOTE — Telephone Encounter (Signed)
 Requested Prescriptions  Refused Prescriptions Disp Refills   buPROPion  (WELLBUTRIN  XL) 150 MG 24 hr tablet [Pharmacy Med Name: buPROPion  HCl ER (XL) 150 MG Oral Tablet Extended Release 24 Hour] 90 tablet 0    Sig: Take 1 tablet by mouth once daily     Psychiatry: Antidepressants - bupropion  Failed - 11/06/2023  9:49 AM      Failed - Last BP in normal range    BP Readings from Last 1 Encounters:  08/13/23 (!) 143/77         Passed - Cr in normal range and within 360 days    Creat  Date Value Ref Range Status  06/09/2023 0.96 0.60 - 1.29 mg/dL Final         Passed - AST in normal range and within 360 days    AST  Date Value Ref Range Status  06/09/2023 28 10 - 40 U/L Final         Passed - ALT in normal range and within 360 days    ALT  Date Value Ref Range Status  06/09/2023 36 9 - 46 U/L Final         Passed - Valid encounter within last 6 months    Recent Outpatient Visits           3 weeks ago Attention deficit hyperactivity disorder (ADHD), predominantly inattentive type   [place] [facility] [place], Angeline ORN, NP   5 months ago Primary hypertension   [place] [facility] [place], [place] W, NP               lisinopril  (ZESTRIL ) 20 MG tablet [Pharmacy Med Name: Lisinopril  20 MG Oral Tablet] 90 tablet 0    Sig: Take 1 tablet by mouth once daily     Cardiovascular:  ACE Inhibitors Failed - 11/06/2023  9:49 AM      Failed - Last BP in normal range    BP Readings from Last 1 Encounters:  08/13/23 (!) 143/77         Passed - Cr in normal range and within 180 days    Creat  Date Value Ref Range Status  06/09/2023 0.96 0.60 - 1.29 mg/dL Final         Passed - K in normal range and within 180 days    Potassium  Date Value Ref Range Status  06/09/2023 4.2 3.5 - 5.3 mmol/L Final         Passed - Patient is not pregnant      Passed - Valid encounter within last 6 months    Recent Outpatient Visits           3 weeks ago  Attention deficit hyperactivity disorder (ADHD), predominantly inattentive type   [facility] [place], Angeline ORN, NP   5 months ago Primary hypertension    [facility] [place], Angeline ORN, [place]

## 2023-11-12 MED ORDER — AMPHETAMINE-DEXTROAMPHETAMINE 10 MG PO TABS
10.0000 mg | ORAL_TABLET | Freq: Every day | ORAL | 0 refills | Status: DC
Start: 2023-11-12 — End: 2023-12-11

## 2023-11-12 MED ORDER — AMPHETAMINE-DEXTROAMPHET ER 20 MG PO CP24
40.0000 mg | ORAL_CAPSULE | ORAL | 0 refills | Status: DC
Start: 2023-11-12 — End: 2023-12-11

## 2023-11-12 NOTE — Addendum Note (Signed)
 Addended by: ANTONETTE ANGELINE ORN on: 11/12/2023 08:55 AM   Modules accepted: Orders

## 2023-12-11 ENCOUNTER — Encounter: Payer: Self-pay | Admitting: Internal Medicine

## 2023-12-12 MED ORDER — AMPHETAMINE-DEXTROAMPHETAMINE 10 MG PO TABS: 10.0000 mg | ORAL_TABLET | Freq: Every day | ORAL | 0 refills | Status: DC

## 2023-12-12 MED ORDER — AMPHETAMINE-DEXTROAMPHET ER 20 MG PO CP24: 40.0000 mg | ORAL_CAPSULE | ORAL | 0 refills | Status: DC

## 2024-01-11 ENCOUNTER — Encounter: Payer: Self-pay | Admitting: Internal Medicine

## 2024-01-12 MED ORDER — AMPHETAMINE-DEXTROAMPHET ER 20 MG PO CP24: 40.0000 mg | ORAL_CAPSULE | ORAL | 0 refills | Status: DC

## 2024-01-12 MED ORDER — AMPHETAMINE-DEXTROAMPHETAMINE 10 MG PO TABS: 10.0000 mg | ORAL_TABLET | Freq: Every day | ORAL | 0 refills | Status: DC

## 2024-01-12 NOTE — Telephone Encounter (Signed)
 Refill denied.  Patient was due to have a physical last month.  He needs to get this scheduled before medication will be refilled.

## 2024-01-16 ENCOUNTER — Encounter: Payer: Self-pay | Admitting: Internal Medicine

## 2024-01-16 ENCOUNTER — Ambulatory Visit (INDEPENDENT_AMBULATORY_CARE_PROVIDER_SITE_OTHER): Admitting: Internal Medicine

## 2024-01-16 VITALS — BP 138/78 | Ht 70.0 in | Wt 189.6 lb

## 2024-01-16 DIAGNOSIS — Z5181 Encounter for therapeutic drug level monitoring: Secondary | ICD-10-CM | POA: Diagnosis not present

## 2024-01-16 DIAGNOSIS — E663 Overweight: Secondary | ICD-10-CM

## 2024-01-16 DIAGNOSIS — R739 Hyperglycemia, unspecified: Secondary | ICD-10-CM

## 2024-01-16 DIAGNOSIS — Z0001 Encounter for general adult medical examination with abnormal findings: Secondary | ICD-10-CM

## 2024-01-16 DIAGNOSIS — E7800 Pure hypercholesterolemia, unspecified: Secondary | ICD-10-CM

## 2024-01-16 DIAGNOSIS — Z6827 Body mass index (BMI) 27.0-27.9, adult: Secondary | ICD-10-CM

## 2024-01-16 MED ORDER — CITALOPRAM HYDROBROMIDE 40 MG PO TABS: ORAL_TABLET | ORAL | 1 refills | Status: AC

## 2024-01-16 NOTE — Assessment & Plan Note (Signed)
 Encouraged diet and exercise for weight loss ?

## 2024-01-16 NOTE — Progress Notes (Signed)
 Subjective:    Patient ID: Peter Kramer, male    DOB: [birth year], 43 y.o.   MRN: [id number]  HPI  Patient presents to clinic today for his annual exam.   Flu: Never Tetanus: 05/2018 COVID: Never Vision screening: as needed Dentist: as needed  Diet: He does eat meat. He consumes fruits and veggies. He tries to avoid fried foods. He drinks mostly water. Exercise: swim, weight lifting  Review of Systems  Past Medical History:  Diagnosis Date   ADHD (attention deficit hyperactivity disorder)    Chicken pox    Essential hypertension 08/08/2016   Generalized anxiety disorder 07/17/2015   Genital herpes    Smoker 11/18/2014    Current Outpatient Medications  Medication Sig Dispense Refill   amphetamine -dextroamphetamine  (ADDERALL XR) 20 MG 24 hr capsule Take 2 capsules (40 mg total) by mouth every morning. 60 capsule 0   amphetamine -dextroamphetamine  (ADDERALL) 10 MG tablet Take 1 tablet (10 mg total) by mouth daily after lunch. 30 tablet 0   atorvastatin  (LIPITOR) 20 MG tablet Take 1 tablet (20 mg total) by mouth daily. 90 tablet 1   buPROPion  (WELLBUTRIN  XL) 150 MG 24 hr tablet Take 1 tablet (150 mg total) by mouth daily. 90 tablet 1   citalopram  (CELEXA ) 40 MG tablet TAKE 1 TABLET BY MOUTH ONCE DAILY (SCHEDULE  AN  OFFICE  VISIT  BEFORE  ANYMORE  REFILLS) 90 tablet 1   lisinopril  (ZESTRIL ) 20 MG tablet Take 1 tablet (20 mg total) by mouth daily. 90 tablet 1   ondansetron  (ZOFRAN ) 4 MG tablet Take 1 tablet (4 mg total) by mouth every 8 (eight) hours as needed for nausea or vomiting. 20 tablet 0   oxyCODONE -acetaminophen  (PERCOCET) 5-325 MG tablet Take 1 tablet by mouth every 4 (four) hours as needed for severe pain (pain score 7-10). 20 tablet 0   predniSONE  (STERAPRED UNI-PAK 21 TAB) 10 MG (21) TBPK tablet Sterapred DS 6 day taper 21 tablet 0   testosterone cypionate (DEPOTESTOSTERONE CYPIONATE) 200 MG/ML injection SMARTSIG:0.6 Milliliter(s) SUB-Q Twice a Week     valACYclovir   (VALTREX ) 500 MG tablet Take 1 tablet (500 mg total) by mouth 2 (two) times daily as needed. 30 tablet 0   No current facility-administered medications for this visit.    Allergies  Allergen Reactions   Bee Venom Swelling    Family History  Problem Relation Age of Onset   ADD / ADHD Father    ADD / ADHD Sister    ADD / ADHD Sister    Heart disease Maternal Grandmother    Cancer Paternal Grandfather        brain   Diabetes Neg Hx    Stroke Neg Hx    Colon cancer Neg Hx    Prostate cancer Neg Hx     Social History   Socioeconomic History   Marital status: Married    Spouse name: emily   Number of children: 2   Years of education: Not on file   Highest education level: GED or equivalent  Occupational History   Occupation: [employer]    Comment: full time  Tobacco Use   Smoking status: Former    Current packs/day: 1.00    Average packs/day: 1 pack/day for 14.0 years (14.0 ttl pk-yrs)    Types: Cigarettes   Smokeless tobacco: Never   Tobacco comments:    patient relapsed  Vaping Use   Vaping status: Never Used  Substance and Sexual Activity   Alcohol use: No  Alcohol/week: 0.0 standard drinks of alcohol    Comment: occasional   Drug use: No   Sexual activity: Yes    Birth control/protection: None  Other Topics Concern   Not on file  Social History Narrative   Not on file   Social Drivers of Health   Financial Resource Strain: High Risk (01/27/2017)   Overall Financial Resource Strain (CARDIA)    Difficulty of Paying Living Expenses: Very hard  Food Insecurity: Food Insecurity Present (01/27/2017)   Hunger Vital Sign    Worried About Running Out of Food in the Last Year: Sometimes true    Ran Out of Food in the Last Year: Sometimes true  Transportation Needs: No Transportation Needs (01/27/2017)   PRAPARE - Administrator, Civil Service (Medical): No    Lack of Transportation (Non-Medical): No  Physical Activity: Sufficiently Active  (01/27/2017)   Exercise Vital Sign    Days of Exercise per Week: 3 days    Minutes of Exercise per Session: 120 min  Stress: Stress Concern Present (01/27/2017)   [employer] of Occupational Health - Occupational Stress Questionnaire    Feeling of Stress : To some extent  Social Connections: Somewhat Isolated (01/27/2017)   Social Connection and Isolation Panel    Frequency of Communication with Friends and Family: More than three times a week    Frequency of Social Gatherings with Friends and Family: Once a week    Attends Religious Services: Never    Database administrator or Organizations: No    Attends Banker Meetings: Never    Marital Status: Married  Catering manager Violence: Not At Risk (01/27/2017)   Humiliation, Afraid, Rape, and Kick questionnaire    Fear of Current or Ex-Partner: No    Emotionally Abused: No    Physically Abused: No    Sexually Abused: No     Constitutional: Denies fever, malaise, fatigue, headache or abrupt weight changes.  HEENT: Denies eye pain, eye redness, ear pain, ringing in the ears, wax buildup, runny nose, nasal congestion, bloody nose, or sore throat. Respiratory: Denies difficulty breathing, shortness of breath, cough or sputum production.   Cardiovascular: Denies chest pain, chest tightness, palpitations or swelling in the hands or feet.  Gastrointestinal: Denies abdominal pain, bloating, constipation, diarrhea or blood in the stool.  GU: Denies urgency, frequency, pain with urination, burning sensation, blood in urine, odor or discharge. Musculoskeletal: Denies decrease in range of motion, difficulty with gait, muscle pain or joint pain and swelling.  Skin: Denies redness, rashes, lesions or ulcercations.  Neurological: Patient reports inattention.  Denies dizziness, difficulty with memory, difficulty with speech or problems with balance and coordination.  Psych: Patient has a history of anxiety and depression.  Denies  SI/HI.  No other specific complaints in a complete review of systems (except as listed in HPI above).     Objective:   Physical Exam  BP 138/78   Ht 5' 10 (1.778 m)   Wt 189 lb 9.6 oz (86 kg)   BMI 27.20 kg/m    Wt Readings from Last 3 Encounters:  08/13/23 189 lb 4.8 oz (85.9 kg)  07/16/23 187 lb (84.8 kg)  06/09/23 191 lb 9.6 oz (86.9 kg)    General: Appears his stated age, overweight, in NAD. Skin: Warm, dry and intact.   HEENT: Head: normal shape and size; Eyes: sclera white, no icterus, conjunctiva pink, PERRLA and EOMs intact;  Neck:  Neck supple, trachea midline. No masses, lumps  or thyromegaly present.  Cardiovascular: Normal rate and rhythm. S1,S2 noted.  No murmur, rubs or gallops noted. No JVD or BLE edema.  Pulmonary/Chest: Normal effort and positive vesicular breath sounds. No respiratory distress. No wheezes, rales or ronchi noted.  Abdomen: Soft and nontender. Normal bowel sounds.  Musculoskeletal: Strength 5/5 BUE/BLE.  No difficulty with gait.  Neurological: Alert and oriented. Cranial nerves II-XII grossly intact. Coordination normal.  Psychiatric: Mood and affect normal. Behavior is normal. Judgment and thought content normal.    BMET    Component Value Date/Time   NA 138 06/09/2023 1648   K 4.2 06/09/2023 1648   CL 99 06/09/2023 1648   CO2 27 06/09/2023 1648   GLUCOSE 87 06/09/2023 1648   BUN 14 06/09/2023 1648   CREATININE 0.96 06/09/2023 1648   CALCIUM  9.8 06/09/2023 1648   GFRNONAA >60 01/08/2017 1239   GFRAA >60 01/08/2017 1239    Lipid Panel     Component Value Date/Time   CHOL 244 (H) 06/09/2023 1648   TRIG 142 06/09/2023 1648   HDL 45 06/09/2023 1648   CHOLHDL 5.4 (H) 06/09/2023 1648   VLDL 20.0 10/05/2019 1334   LDLCALC 171 (H) 06/09/2023 1648    CBC    Component Value Date/Time   WBC 9.1 06/09/2023 1648   RBC 5.64 06/09/2023 1648   HGB 16.4 06/09/2023 1648   HCT 49.6 06/09/2023 1648   PLT 326 06/09/2023 1648   MCV 87.9  06/09/2023 1648   MCH 29.1 06/09/2023 1648   MCHC 33.1 06/09/2023 1648   RDW 13.0 06/09/2023 1648    Hgb A1C Lab Results  Component Value Date   HGBA1C 5.6 06/09/2023            Assessment & Plan:   Preventative health maintenance:  He declines flu shot Tetanus UTD Encouraged him to get his COVID vaccine Encouraged him to consume a balanced diet and exercise regimen Advised him to see an eye doctor and dentist annually We will check c-Met, lipid profile and A1c today  RTC in 6 months, follow-up chronic conditions Angeline Laura, NP

## 2024-01-16 NOTE — Patient Instructions (Signed)
 Health Maintenance, Male  Adopting a healthy lifestyle and getting preventive care are important in promoting health and wellness. Ask your health care provider about:  The right schedule for you to have regular tests and exams.  Things you can do on your own to prevent diseases and keep yourself healthy.  What should I know about diet, weight, and exercise?  Eat a healthy diet    Eat a diet that includes plenty of vegetables, fruits, low-fat dairy products, and lean protein.  Do not eat a lot of foods that are high in solid fats, added sugars, or sodium.  Maintain a healthy weight  Body mass index (BMI) is a measurement that can be used to identify possible weight problems. It estimates body fat based on height and weight. Your health care provider can help determine your BMI and help you achieve or maintain a healthy weight.  Get regular exercise  Get regular exercise. This is one of the most important things you can do for your health. Most adults should:  Exercise for at least 150 minutes each week. The exercise should increase your heart rate and make you sweat (moderate-intensity exercise).  Do strengthening exercises at least twice a week. This is in addition to the moderate-intensity exercise.  Spend less time sitting. Even light physical activity can be beneficial.  Watch cholesterol and blood lipids  Have your blood tested for lipids and cholesterol at 41 years of age, then have this test every 5 years.  You may need to have your cholesterol levels checked more often if:  Your lipid or cholesterol levels are high.  You are older than 41 years of age.  You are at high risk for heart disease.  What should I know about cancer screening?  Many types of cancers can be detected early and may often be prevented. Depending on your health history and family history, you may need to have cancer screening at various ages. This may include screening for:  Colorectal cancer.  Prostate cancer.  Skin cancer.  Lung  cancer.  What should I know about heart disease, diabetes, and high blood pressure?  Blood pressure and heart disease  High blood pressure causes heart disease and increases the risk of stroke. This is more likely to develop in people who have high blood pressure readings or are overweight.  Talk with your health care provider about your target blood pressure readings.  Have your blood pressure checked:  Every 3-5 years if you are 24-52 years of age.  Every year if you are 3 years old or older.  If you are between the ages of 60 and 72 and are a current or former smoker, ask your health care provider if you should have a one-time screening for abdominal aortic aneurysm (AAA).  Diabetes  Have regular diabetes screenings. This checks your fasting blood sugar level. Have the screening done:  Once every three years after age 66 if you are at a normal weight and have a low risk for diabetes.  More often and at a younger age if you are overweight or have a high risk for diabetes.  What should I know about preventing infection?  Hepatitis B  If you have a higher risk for hepatitis B, you should be screened for this virus. Talk with your health care provider to find out if you are at risk for hepatitis B infection.  Hepatitis C  Blood testing is recommended for:  Everyone born from [birth year] through 1963.  Anyone  with known risk factors for hepatitis C.  Sexually transmitted infections (STIs)  You should be screened each year for STIs, including gonorrhea and chlamydia, if:  You are sexually active and are younger than 41 years of age.  You are older than 41 years of age and your health care provider tells you that you are at risk for this type of infection.  Your sexual activity has changed since you were last screened, and you are at increased risk for chlamydia or gonorrhea. Ask your health care provider if you are at risk.  Ask your health care provider about whether you are at high risk for HIV. Your health care provider  may recommend a prescription medicine to help prevent HIV infection. If you choose to take medicine to prevent HIV, you should first get tested for HIV. You should then be tested every 3 months for as long as you are taking the medicine.  Follow these instructions at home:  Alcohol use  Do not drink alcohol if your health care provider tells you not to drink.  If you drink alcohol:  Limit how much you have to 0-2 drinks a day.  Know how much alcohol is in your drink. In the [place] one drink equals one 12 oz bottle of beer (355 mL), one 5 oz glass of wine (148 mL), or one 1 oz glass of hard liquor (44 mL).  Lifestyle  Do not use any products that contain nicotine or tobacco. These products include cigarettes, chewing tobacco, and vaping devices, such as e-cigarettes. If you need help quitting, ask your health care provider.  Do not use street drugs.  Do not share needles.  Ask your health care provider for help if you need support or information about quitting drugs.  General instructions  Schedule regular health, dental, and eye exams.  Stay current with your vaccines.  Tell your health care provider if:  You often feel depressed.  You have ever been abused or do not feel safe at home.  Summary  Adopting a healthy lifestyle and getting preventive care are important in promoting health and wellness.  Follow your health care provider's instructions about healthy diet, exercising, and getting tested or screened for diseases.  Follow your health care provider's instructions on monitoring your cholesterol and blood pressure.  This information is not intended to replace advice given to you by your health care provider. Make sure you discuss any questions you have with your health care provider.  Document Revised: 07/31/2020 Document Reviewed: 07/31/2020  Elsevier Patient Education  2024 [employer].

## 2024-02-11 ENCOUNTER — Encounter: Payer: Self-pay | Admitting: Internal Medicine

## 2024-02-11 MED ORDER — AMPHETAMINE-DEXTROAMPHETAMINE 10 MG PO TABS: 10.0000 mg | ORAL_TABLET | Freq: Every day | ORAL | 0 refills | Status: DC

## 2024-02-11 MED ORDER — AMPHETAMINE-DEXTROAMPHET ER 20 MG PO CP24: 40.0000 mg | ORAL_CAPSULE | ORAL | 0 refills | Status: DC

## 2024-03-11 MED ORDER — AMPHETAMINE-DEXTROAMPHET ER 20 MG PO CP24: 40.0000 mg | ORAL_CAPSULE | ORAL | 0 refills | Status: DC

## 2024-03-11 MED ORDER — AMPHETAMINE-DEXTROAMPHETAMINE 10 MG PO TABS: 10.0000 mg | ORAL_TABLET | Freq: Every day | ORAL | 0 refills | Status: DC

## 2024-03-11 NOTE — Addendum Note (Signed)
 Addended by: ANTONETTE ANGELINE ORN on: 03/11/2024 08:20 AM   Modules accepted: Orders

## 2024-04-10 ENCOUNTER — Encounter: Payer: Self-pay | Admitting: Internal Medicine

## 2024-04-12 MED ORDER — AMPHETAMINE-DEXTROAMPHET ER 20 MG PO CP24: 40.0000 mg | ORAL_CAPSULE | ORAL | 0 refills | Status: AC

## 2024-04-12 MED ORDER — AMPHETAMINE-DEXTROAMPHETAMINE 10 MG PO TABS: 10.0000 mg | ORAL_TABLET | Freq: Every day | ORAL | 0 refills | Status: AC

## 2024-07-16 ENCOUNTER — Ambulatory Visit: Admitting: Internal Medicine
